# Patient Record
Sex: Female | Born: 1935 | Race: White | Hispanic: No | Smoking: Never smoker
Health system: Southern US, Community
[De-identification: ages and names within clinical notes are randomized; demographics above are authoritative.]

## PROBLEM LIST (undated history)

## (undated) DIAGNOSIS — E039 Hypothyroidism, unspecified: Secondary | ICD-10-CM

## (undated) DIAGNOSIS — E785 Hyperlipidemia, unspecified: Secondary | ICD-10-CM

## (undated) DIAGNOSIS — N289 Disorder of kidney and ureter, unspecified: Secondary | ICD-10-CM

## (undated) DIAGNOSIS — R42 Dizziness and giddiness: Secondary | ICD-10-CM

## (undated) DIAGNOSIS — M810 Age-related osteoporosis without current pathological fracture: Secondary | ICD-10-CM

## (undated) DIAGNOSIS — E871 Hypo-osmolality and hyponatremia: Secondary | ICD-10-CM

## (undated) DIAGNOSIS — I10 Essential (primary) hypertension: Secondary | ICD-10-CM

## (undated) DIAGNOSIS — N189 Chronic kidney disease, unspecified: Secondary | ICD-10-CM

## (undated) HISTORY — DX: Dizziness and giddiness: R42

## (undated) HISTORY — PX: BREAST BIOPSY: SHX20

## (undated) HISTORY — DX: Age-related osteoporosis without current pathological fracture: M81.0

## (undated) HISTORY — PX: COLONOSCOPY W/ POLYPECTOMY: SHX1380

## (undated) HISTORY — DX: Essential (primary) hypertension: I10

## (undated) HISTORY — DX: Hyperlipidemia, unspecified: E78.5

## (undated) HISTORY — DX: Chronic kidney disease, unspecified: N18.9

---

## 2000-06-29 ENCOUNTER — Ambulatory Visit (HOSPITAL_COMMUNITY): Admission: RE | Admit: 2000-06-29 | Discharge: 2000-06-29 | Payer: Self-pay | Admitting: Gastroenterology

## 2000-06-29 ENCOUNTER — Encounter: Payer: Self-pay | Admitting: Gastroenterology

## 2006-06-19 ENCOUNTER — Ambulatory Visit: Payer: Self-pay | Admitting: Unknown Physician Specialty

## 2010-07-13 ENCOUNTER — Inpatient Hospital Stay: Payer: Self-pay | Admitting: Internal Medicine

## 2010-10-05 ENCOUNTER — Ambulatory Visit: Payer: Self-pay

## 2010-10-06 ENCOUNTER — Inpatient Hospital Stay: Payer: Self-pay | Admitting: Surgery

## 2010-10-26 ENCOUNTER — Ambulatory Visit: Payer: Self-pay | Admitting: Surgery

## 2010-11-30 ENCOUNTER — Ambulatory Visit: Payer: Self-pay | Admitting: Urology

## 2011-06-19 DIAGNOSIS — E872 Acidosis, unspecified: Secondary | ICD-10-CM | POA: Diagnosis not present

## 2011-06-19 DIAGNOSIS — N184 Chronic kidney disease, stage 4 (severe): Secondary | ICD-10-CM | POA: Diagnosis not present

## 2011-06-19 DIAGNOSIS — I10 Essential (primary) hypertension: Secondary | ICD-10-CM | POA: Diagnosis not present

## 2011-06-19 DIAGNOSIS — N2581 Secondary hyperparathyroidism of renal origin: Secondary | ICD-10-CM | POA: Diagnosis not present

## 2011-06-20 DIAGNOSIS — L821 Other seborrheic keratosis: Secondary | ICD-10-CM | POA: Diagnosis not present

## 2011-06-20 DIAGNOSIS — D485 Neoplasm of uncertain behavior of skin: Secondary | ICD-10-CM | POA: Diagnosis not present

## 2011-06-20 DIAGNOSIS — Z85828 Personal history of other malignant neoplasm of skin: Secondary | ICD-10-CM | POA: Diagnosis not present

## 2011-06-20 DIAGNOSIS — L570 Actinic keratosis: Secondary | ICD-10-CM | POA: Diagnosis not present

## 2011-07-04 DIAGNOSIS — N170 Acute kidney failure with tubular necrosis: Secondary | ICD-10-CM | POA: Diagnosis not present

## 2011-08-24 DIAGNOSIS — J209 Acute bronchitis, unspecified: Secondary | ICD-10-CM | POA: Diagnosis not present

## 2011-09-04 DIAGNOSIS — Z0000 Encounter for general adult medical examination without abnormal findings: Secondary | ICD-10-CM | POA: Diagnosis not present

## 2011-09-04 DIAGNOSIS — E785 Hyperlipidemia, unspecified: Secondary | ICD-10-CM | POA: Diagnosis not present

## 2011-09-04 DIAGNOSIS — M899 Disorder of bone, unspecified: Secondary | ICD-10-CM | POA: Diagnosis not present

## 2011-09-04 DIAGNOSIS — M949 Disorder of cartilage, unspecified: Secondary | ICD-10-CM | POA: Diagnosis not present

## 2011-09-04 DIAGNOSIS — I10 Essential (primary) hypertension: Secondary | ICD-10-CM | POA: Diagnosis not present

## 2011-09-10 DIAGNOSIS — E872 Acidosis, unspecified: Secondary | ICD-10-CM | POA: Diagnosis not present

## 2011-09-10 DIAGNOSIS — I10 Essential (primary) hypertension: Secondary | ICD-10-CM | POA: Diagnosis not present

## 2011-09-10 DIAGNOSIS — N2581 Secondary hyperparathyroidism of renal origin: Secondary | ICD-10-CM | POA: Diagnosis not present

## 2011-09-10 DIAGNOSIS — N184 Chronic kidney disease, stage 4 (severe): Secondary | ICD-10-CM | POA: Diagnosis not present

## 2011-09-11 DIAGNOSIS — Z86010 Personal history of colonic polyps: Secondary | ICD-10-CM | POA: Diagnosis not present

## 2011-10-19 ENCOUNTER — Ambulatory Visit: Payer: Self-pay | Admitting: Unknown Physician Specialty

## 2011-10-19 DIAGNOSIS — Z860100 Personal history of colon polyps, unspecified: Secondary | ICD-10-CM | POA: Diagnosis not present

## 2011-10-19 DIAGNOSIS — Z86718 Personal history of other venous thrombosis and embolism: Secondary | ICD-10-CM | POA: Diagnosis not present

## 2011-10-19 DIAGNOSIS — E039 Hypothyroidism, unspecified: Secondary | ICD-10-CM | POA: Diagnosis not present

## 2011-10-19 DIAGNOSIS — Z09 Encounter for follow-up examination after completed treatment for conditions other than malignant neoplasm: Secondary | ICD-10-CM | POA: Diagnosis not present

## 2011-10-19 DIAGNOSIS — I10 Essential (primary) hypertension: Secondary | ICD-10-CM | POA: Diagnosis not present

## 2011-10-19 DIAGNOSIS — K5730 Diverticulosis of large intestine without perforation or abscess without bleeding: Secondary | ICD-10-CM | POA: Diagnosis not present

## 2011-10-19 DIAGNOSIS — E785 Hyperlipidemia, unspecified: Secondary | ICD-10-CM | POA: Diagnosis not present

## 2011-10-19 DIAGNOSIS — K648 Other hemorrhoids: Secondary | ICD-10-CM | POA: Diagnosis not present

## 2011-10-19 DIAGNOSIS — D126 Benign neoplasm of colon, unspecified: Secondary | ICD-10-CM | POA: Diagnosis not present

## 2011-10-19 DIAGNOSIS — Z9889 Other specified postprocedural states: Secondary | ICD-10-CM | POA: Diagnosis not present

## 2011-10-19 DIAGNOSIS — Z79899 Other long term (current) drug therapy: Secondary | ICD-10-CM | POA: Diagnosis not present

## 2011-10-22 LAB — PATHOLOGY REPORT

## 2011-10-30 IMAGING — US US RENAL KIDNEY
1 series · 14 of 25 positions shown · non-contrast
Comparison: none

REASON FOR EXAM: atrophic kidney
COMMENTS:

[Series 1: us renal kidney · 0.24mm/px · 14 of 33 slices shown]
[im 1/33]
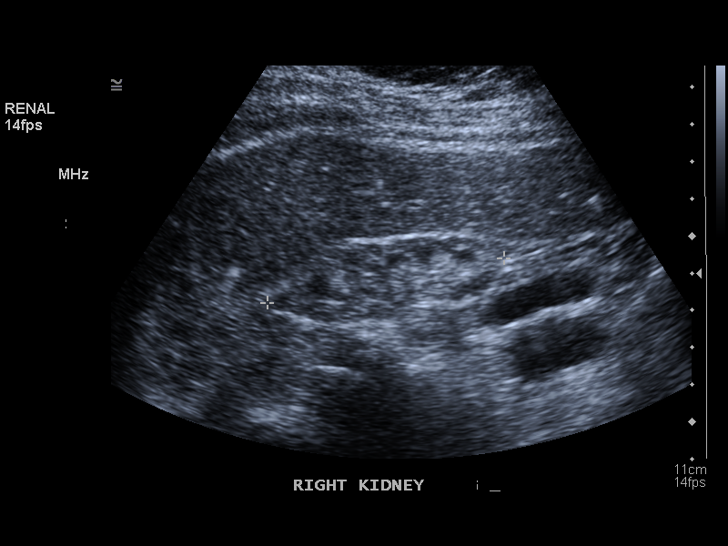
[im 3/33]
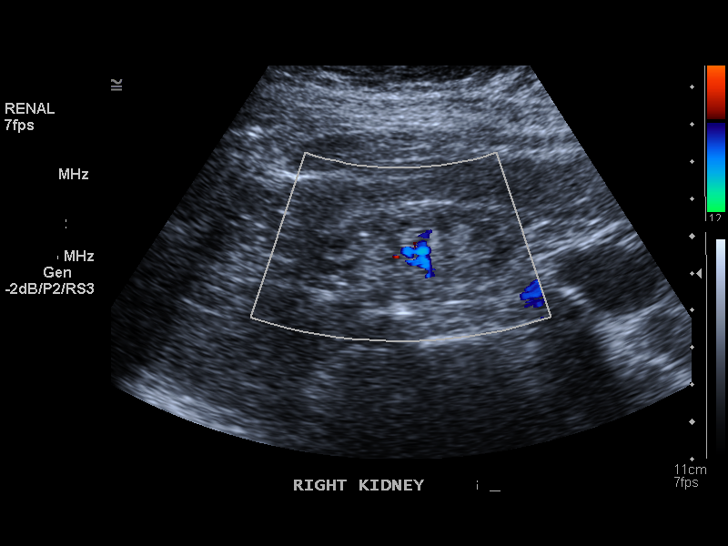
[im 6/33]
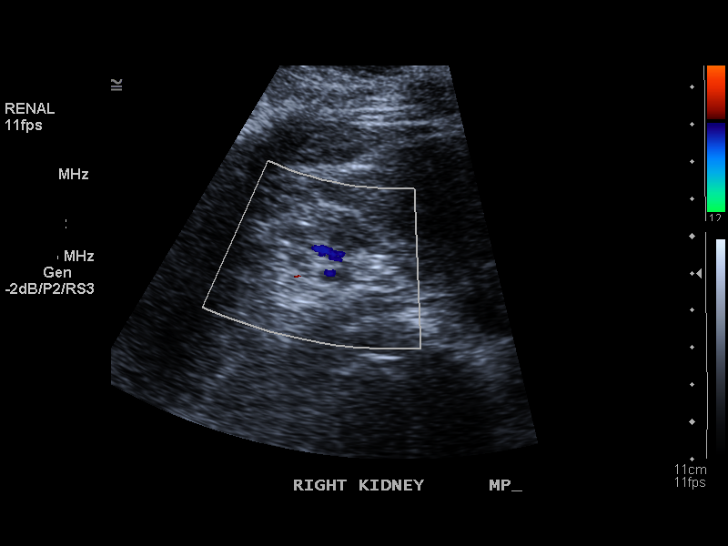
[im 9/33]
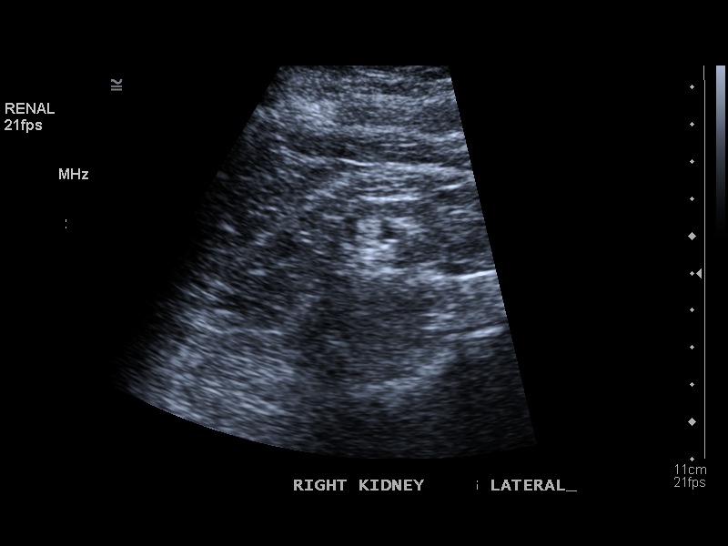
[im 11/33]
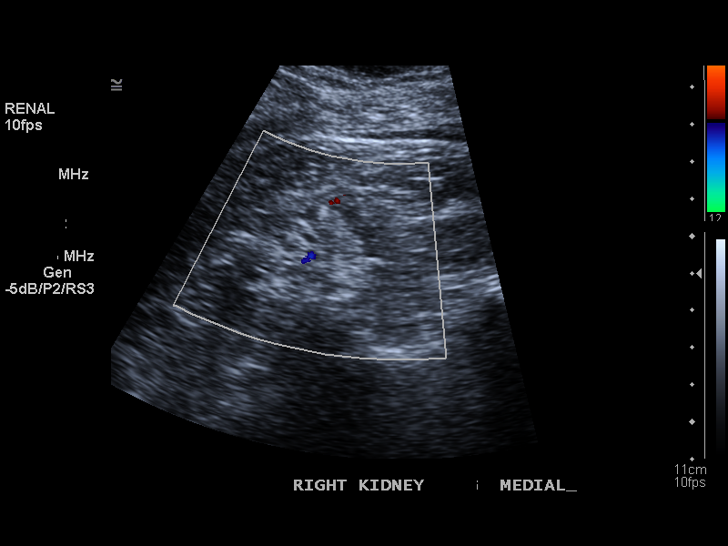
[im 13/33]
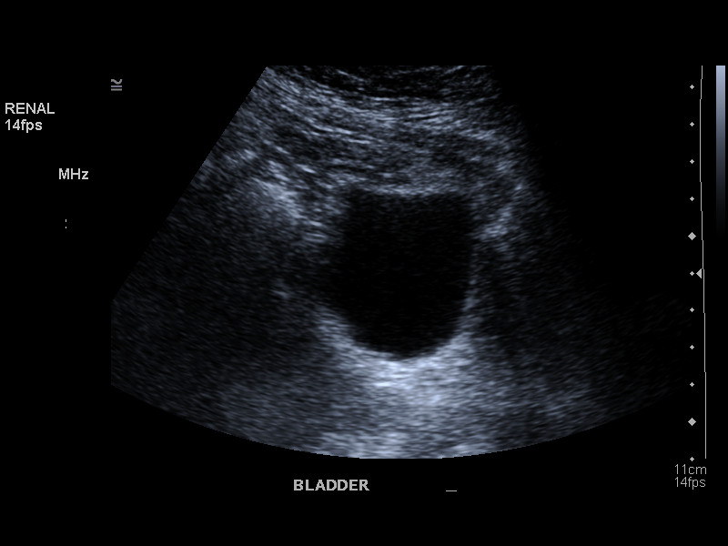
[im 15/33]
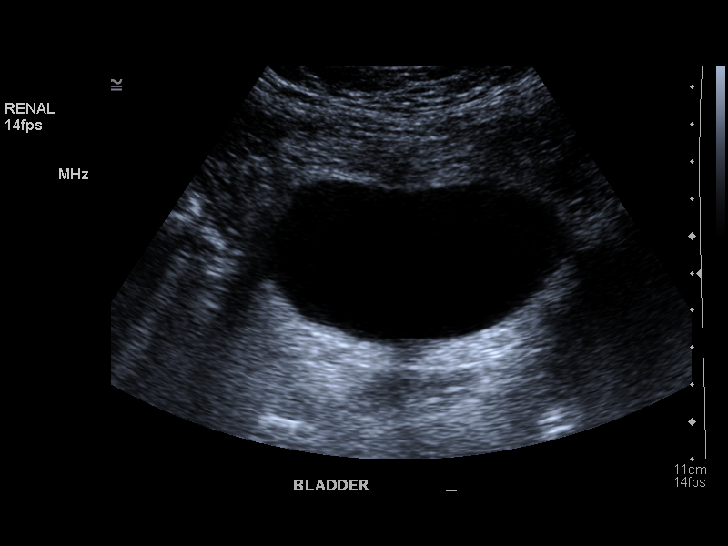
[im 18/33]
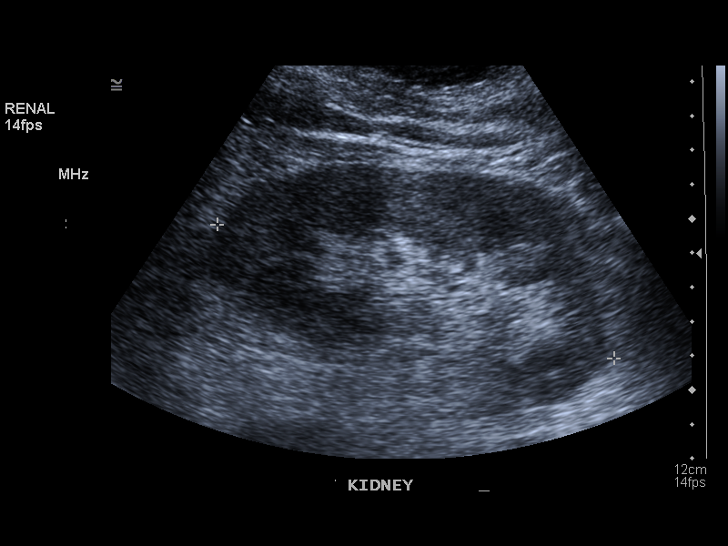
[im 21/33]
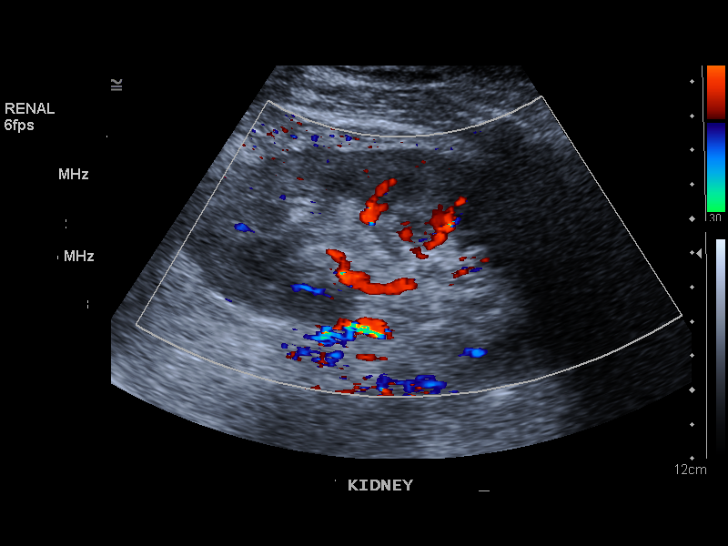
[im 22/33]
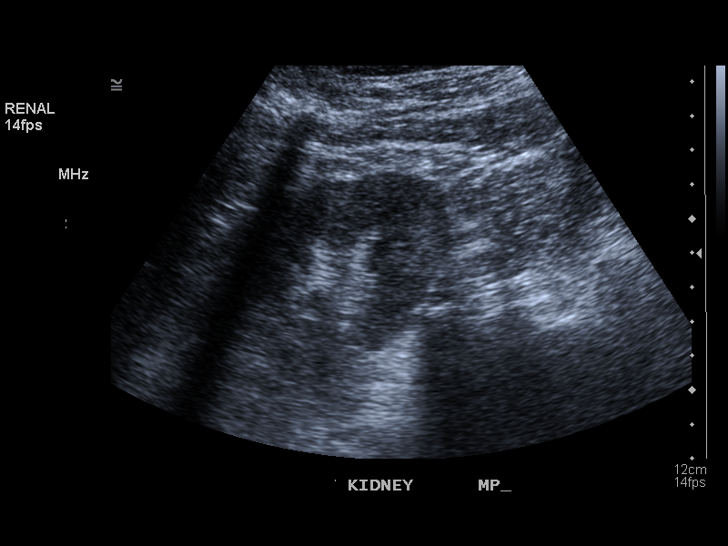
[im 25/33]
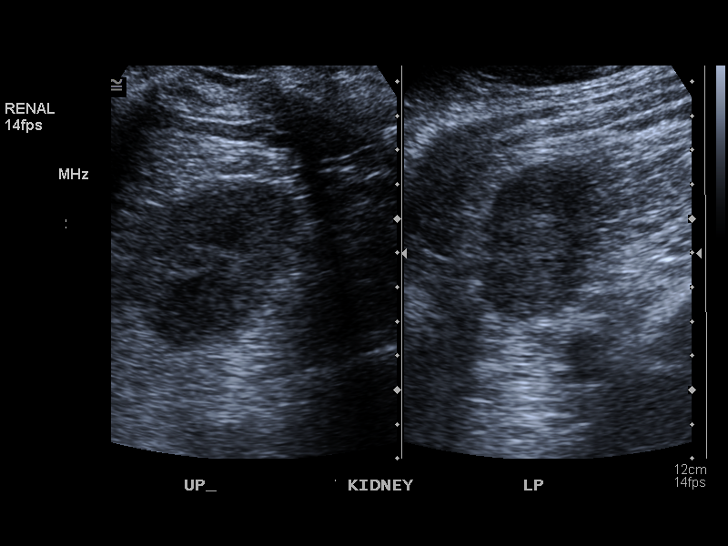
[im 27/33]
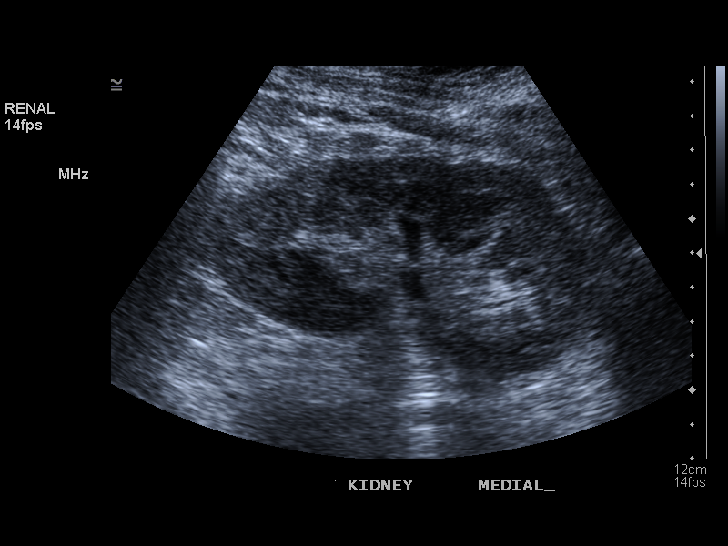
[im 30/33]
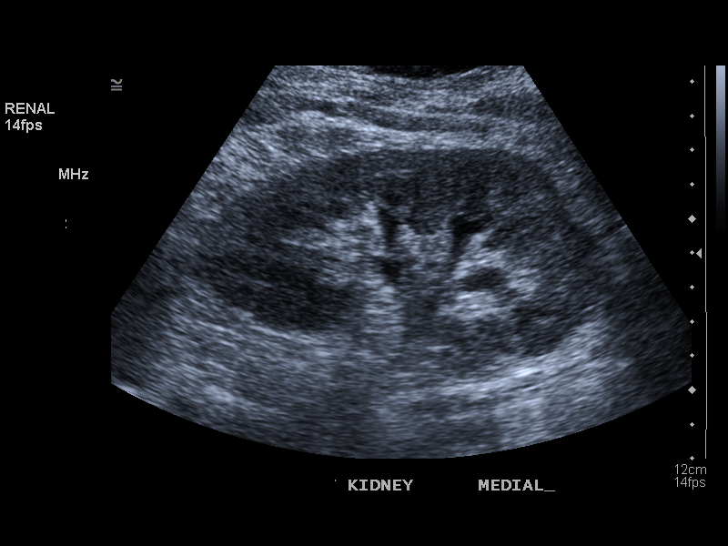
[im 33/33]
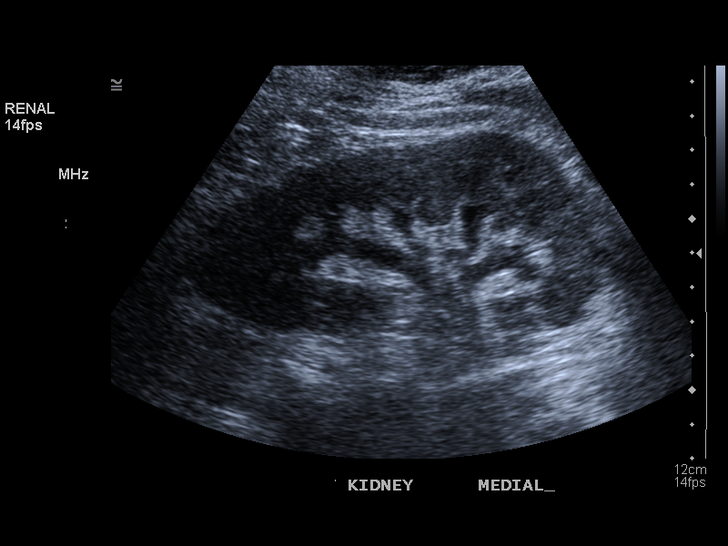

[14 of 25 positions shown; findings below may reference images not displayed]

PROCEDURE:     US  - US KIDNEY  - November 30, 2010 [DATE]

RESULT:     The right kidney measures 6.48 cm x 2.11 cm x 2.19 cm and the
left kidney measures 12.77 cm x 4.87 cm x 5.52 cm. No solid renal mass
lesions or renal calcifications are identified on either side. There is
slight prominence of the renal collecting system on the left compatible with
minimal hydronephrosis or minimal ectasia. The visualized portion of the
urinary bladder shows no specific abnormalities. Right and left ureteral
flow jets are seen.
IMPRESSION: 1. The right kidney is atrophic and hyperechogenic.
2. There is mild prominence of the renal collecting system of the right
kidney compatible with ectasia or less likely slight hydronephrosis.
3. Bilateral ureteral flow jets are seen.

## 2011-12-03 DIAGNOSIS — I10 Essential (primary) hypertension: Secondary | ICD-10-CM | POA: Diagnosis not present

## 2011-12-03 DIAGNOSIS — E872 Acidosis, unspecified: Secondary | ICD-10-CM | POA: Diagnosis not present

## 2011-12-03 DIAGNOSIS — N183 Chronic kidney disease, stage 3 unspecified: Secondary | ICD-10-CM | POA: Diagnosis not present

## 2011-12-03 DIAGNOSIS — N2581 Secondary hyperparathyroidism of renal origin: Secondary | ICD-10-CM | POA: Diagnosis not present

## 2012-02-25 DIAGNOSIS — E872 Acidosis, unspecified: Secondary | ICD-10-CM | POA: Diagnosis not present

## 2012-02-25 DIAGNOSIS — N183 Chronic kidney disease, stage 3 unspecified: Secondary | ICD-10-CM | POA: Diagnosis not present

## 2012-02-25 DIAGNOSIS — N2581 Secondary hyperparathyroidism of renal origin: Secondary | ICD-10-CM | POA: Diagnosis not present

## 2012-02-25 DIAGNOSIS — I10 Essential (primary) hypertension: Secondary | ICD-10-CM | POA: Diagnosis not present

## 2012-03-05 DIAGNOSIS — I10 Essential (primary) hypertension: Secondary | ICD-10-CM | POA: Diagnosis not present

## 2012-03-05 DIAGNOSIS — R7989 Other specified abnormal findings of blood chemistry: Secondary | ICD-10-CM | POA: Diagnosis not present

## 2012-03-05 DIAGNOSIS — E785 Hyperlipidemia, unspecified: Secondary | ICD-10-CM | POA: Diagnosis not present

## 2012-03-05 DIAGNOSIS — Z23 Encounter for immunization: Secondary | ICD-10-CM | POA: Diagnosis not present

## 2012-05-19 DIAGNOSIS — N184 Chronic kidney disease, stage 4 (severe): Secondary | ICD-10-CM | POA: Diagnosis not present

## 2012-05-19 DIAGNOSIS — I10 Essential (primary) hypertension: Secondary | ICD-10-CM | POA: Diagnosis not present

## 2012-05-19 DIAGNOSIS — N2581 Secondary hyperparathyroidism of renal origin: Secondary | ICD-10-CM | POA: Diagnosis not present

## 2012-05-19 DIAGNOSIS — E872 Acidosis, unspecified: Secondary | ICD-10-CM | POA: Diagnosis not present

## 2012-06-03 DIAGNOSIS — N390 Urinary tract infection, site not specified: Secondary | ICD-10-CM | POA: Diagnosis not present

## 2012-07-24 DIAGNOSIS — L821 Other seborrheic keratosis: Secondary | ICD-10-CM | POA: Diagnosis not present

## 2012-07-24 DIAGNOSIS — D2370 Other benign neoplasm of skin of unspecified lower limb, including hip: Secondary | ICD-10-CM | POA: Diagnosis not present

## 2012-08-20 DIAGNOSIS — N184 Chronic kidney disease, stage 4 (severe): Secondary | ICD-10-CM | POA: Diagnosis not present

## 2012-08-26 DIAGNOSIS — N184 Chronic kidney disease, stage 4 (severe): Secondary | ICD-10-CM | POA: Diagnosis not present

## 2012-08-26 DIAGNOSIS — N2581 Secondary hyperparathyroidism of renal origin: Secondary | ICD-10-CM | POA: Diagnosis not present

## 2012-08-26 DIAGNOSIS — I10 Essential (primary) hypertension: Secondary | ICD-10-CM | POA: Diagnosis not present

## 2012-08-26 DIAGNOSIS — E872 Acidosis, unspecified: Secondary | ICD-10-CM | POA: Diagnosis not present

## 2012-10-20 DIAGNOSIS — E039 Hypothyroidism, unspecified: Secondary | ICD-10-CM | POA: Diagnosis not present

## 2012-10-20 DIAGNOSIS — I10 Essential (primary) hypertension: Secondary | ICD-10-CM | POA: Diagnosis not present

## 2012-10-20 DIAGNOSIS — Z0000 Encounter for general adult medical examination without abnormal findings: Secondary | ICD-10-CM | POA: Diagnosis not present

## 2012-10-20 DIAGNOSIS — E7800 Pure hypercholesterolemia, unspecified: Secondary | ICD-10-CM | POA: Diagnosis not present

## 2012-10-20 DIAGNOSIS — N189 Chronic kidney disease, unspecified: Secondary | ICD-10-CM | POA: Diagnosis not present

## 2012-12-10 DIAGNOSIS — N184 Chronic kidney disease, stage 4 (severe): Secondary | ICD-10-CM | POA: Diagnosis not present

## 2012-12-17 DIAGNOSIS — N2581 Secondary hyperparathyroidism of renal origin: Secondary | ICD-10-CM | POA: Diagnosis not present

## 2012-12-17 DIAGNOSIS — E872 Acidosis, unspecified: Secondary | ICD-10-CM | POA: Diagnosis not present

## 2012-12-17 DIAGNOSIS — N184 Chronic kidney disease, stage 4 (severe): Secondary | ICD-10-CM | POA: Diagnosis not present

## 2012-12-17 DIAGNOSIS — I10 Essential (primary) hypertension: Secondary | ICD-10-CM | POA: Diagnosis not present

## 2013-01-06 DIAGNOSIS — L821 Other seborrheic keratosis: Secondary | ICD-10-CM | POA: Diagnosis not present

## 2013-01-06 DIAGNOSIS — Q809 Congenital ichthyosis, unspecified: Secondary | ICD-10-CM | POA: Diagnosis not present

## 2013-01-06 DIAGNOSIS — L570 Actinic keratosis: Secondary | ICD-10-CM | POA: Diagnosis not present

## 2013-01-06 DIAGNOSIS — L819 Disorder of pigmentation, unspecified: Secondary | ICD-10-CM | POA: Diagnosis not present

## 2013-03-19 DIAGNOSIS — Z23 Encounter for immunization: Secondary | ICD-10-CM | POA: Diagnosis not present

## 2013-04-15 DIAGNOSIS — I10 Essential (primary) hypertension: Secondary | ICD-10-CM | POA: Diagnosis not present

## 2013-04-15 DIAGNOSIS — N2581 Secondary hyperparathyroidism of renal origin: Secondary | ICD-10-CM | POA: Diagnosis not present

## 2013-04-15 DIAGNOSIS — E872 Acidosis, unspecified: Secondary | ICD-10-CM | POA: Diagnosis not present

## 2013-04-15 DIAGNOSIS — N184 Chronic kidney disease, stage 4 (severe): Secondary | ICD-10-CM | POA: Diagnosis not present

## 2013-04-22 DIAGNOSIS — N2581 Secondary hyperparathyroidism of renal origin: Secondary | ICD-10-CM | POA: Diagnosis not present

## 2013-04-22 DIAGNOSIS — E872 Acidosis, unspecified: Secondary | ICD-10-CM | POA: Diagnosis not present

## 2013-04-22 DIAGNOSIS — N184 Chronic kidney disease, stage 4 (severe): Secondary | ICD-10-CM | POA: Diagnosis not present

## 2013-04-22 DIAGNOSIS — I10 Essential (primary) hypertension: Secondary | ICD-10-CM | POA: Diagnosis not present

## 2013-08-05 DIAGNOSIS — E872 Acidosis, unspecified: Secondary | ICD-10-CM | POA: Diagnosis not present

## 2013-08-05 DIAGNOSIS — N184 Chronic kidney disease, stage 4 (severe): Secondary | ICD-10-CM | POA: Diagnosis not present

## 2013-08-11 DIAGNOSIS — N2581 Secondary hyperparathyroidism of renal origin: Secondary | ICD-10-CM | POA: Diagnosis not present

## 2013-08-11 DIAGNOSIS — I10 Essential (primary) hypertension: Secondary | ICD-10-CM | POA: Diagnosis not present

## 2013-08-11 DIAGNOSIS — E872 Acidosis, unspecified: Secondary | ICD-10-CM | POA: Diagnosis not present

## 2013-08-11 DIAGNOSIS — N183 Chronic kidney disease, stage 3 unspecified: Secondary | ICD-10-CM | POA: Diagnosis not present

## 2013-08-27 DIAGNOSIS — D485 Neoplasm of uncertain behavior of skin: Secondary | ICD-10-CM | POA: Diagnosis not present

## 2013-08-27 DIAGNOSIS — L821 Other seborrheic keratosis: Secondary | ICD-10-CM | POA: Diagnosis not present

## 2013-08-27 DIAGNOSIS — B079 Viral wart, unspecified: Secondary | ICD-10-CM | POA: Diagnosis not present

## 2013-08-27 DIAGNOSIS — Z85828 Personal history of other malignant neoplasm of skin: Secondary | ICD-10-CM | POA: Diagnosis not present

## 2013-12-09 DIAGNOSIS — I10 Essential (primary) hypertension: Secondary | ICD-10-CM | POA: Diagnosis not present

## 2013-12-09 DIAGNOSIS — N2581 Secondary hyperparathyroidism of renal origin: Secondary | ICD-10-CM | POA: Diagnosis not present

## 2013-12-09 DIAGNOSIS — E872 Acidosis, unspecified: Secondary | ICD-10-CM | POA: Diagnosis not present

## 2013-12-09 DIAGNOSIS — N184 Chronic kidney disease, stage 4 (severe): Secondary | ICD-10-CM | POA: Diagnosis not present

## 2013-12-16 DIAGNOSIS — I10 Essential (primary) hypertension: Secondary | ICD-10-CM | POA: Diagnosis not present

## 2013-12-16 DIAGNOSIS — N183 Chronic kidney disease, stage 3 unspecified: Secondary | ICD-10-CM | POA: Diagnosis not present

## 2013-12-16 DIAGNOSIS — N2581 Secondary hyperparathyroidism of renal origin: Secondary | ICD-10-CM | POA: Diagnosis not present

## 2013-12-16 DIAGNOSIS — E872 Acidosis, unspecified: Secondary | ICD-10-CM | POA: Diagnosis not present

## 2013-12-30 DIAGNOSIS — Z23 Encounter for immunization: Secondary | ICD-10-CM | POA: Diagnosis not present

## 2013-12-30 DIAGNOSIS — Z0000 Encounter for general adult medical examination without abnormal findings: Secondary | ICD-10-CM | POA: Diagnosis not present

## 2013-12-30 DIAGNOSIS — I10 Essential (primary) hypertension: Secondary | ICD-10-CM | POA: Diagnosis not present

## 2013-12-30 DIAGNOSIS — E039 Hypothyroidism, unspecified: Secondary | ICD-10-CM | POA: Diagnosis not present

## 2014-01-18 DIAGNOSIS — M949 Disorder of cartilage, unspecified: Secondary | ICD-10-CM | POA: Diagnosis not present

## 2014-01-18 DIAGNOSIS — M899 Disorder of bone, unspecified: Secondary | ICD-10-CM | POA: Diagnosis not present

## 2014-01-18 DIAGNOSIS — R937 Abnormal findings on diagnostic imaging of other parts of musculoskeletal system: Secondary | ICD-10-CM | POA: Diagnosis not present

## 2014-01-18 DIAGNOSIS — Z13820 Encounter for screening for osteoporosis: Secondary | ICD-10-CM | POA: Diagnosis not present

## 2014-01-18 DIAGNOSIS — Z780 Asymptomatic menopausal state: Secondary | ICD-10-CM | POA: Diagnosis not present

## 2014-01-18 DIAGNOSIS — Z1231 Encounter for screening mammogram for malignant neoplasm of breast: Secondary | ICD-10-CM | POA: Diagnosis not present

## 2014-01-18 DIAGNOSIS — E2839 Other primary ovarian failure: Secondary | ICD-10-CM | POA: Diagnosis not present

## 2014-02-22 DIAGNOSIS — Z23 Encounter for immunization: Secondary | ICD-10-CM | POA: Diagnosis not present

## 2014-03-31 DIAGNOSIS — N2581 Secondary hyperparathyroidism of renal origin: Secondary | ICD-10-CM | POA: Diagnosis not present

## 2014-03-31 DIAGNOSIS — E872 Acidosis: Secondary | ICD-10-CM | POA: Diagnosis not present

## 2014-03-31 DIAGNOSIS — N184 Chronic kidney disease, stage 4 (severe): Secondary | ICD-10-CM | POA: Diagnosis not present

## 2014-03-31 DIAGNOSIS — I10 Essential (primary) hypertension: Secondary | ICD-10-CM | POA: Diagnosis not present

## 2014-04-08 DIAGNOSIS — E872 Acidosis: Secondary | ICD-10-CM | POA: Diagnosis not present

## 2014-04-08 DIAGNOSIS — N2581 Secondary hyperparathyroidism of renal origin: Secondary | ICD-10-CM | POA: Diagnosis not present

## 2014-04-08 DIAGNOSIS — I10 Essential (primary) hypertension: Secondary | ICD-10-CM | POA: Diagnosis not present

## 2014-04-08 DIAGNOSIS — N184 Chronic kidney disease, stage 4 (severe): Secondary | ICD-10-CM | POA: Diagnosis not present

## 2014-05-19 DIAGNOSIS — M810 Age-related osteoporosis without current pathological fracture: Secondary | ICD-10-CM | POA: Diagnosis not present

## 2014-05-19 DIAGNOSIS — N184 Chronic kidney disease, stage 4 (severe): Secondary | ICD-10-CM | POA: Diagnosis not present

## 2014-05-19 DIAGNOSIS — I10 Essential (primary) hypertension: Secondary | ICD-10-CM | POA: Diagnosis not present

## 2014-05-19 DIAGNOSIS — R42 Dizziness and giddiness: Secondary | ICD-10-CM | POA: Diagnosis not present

## 2014-05-19 DIAGNOSIS — E780 Pure hypercholesterolemia: Secondary | ICD-10-CM | POA: Diagnosis not present

## 2014-05-19 DIAGNOSIS — E039 Hypothyroidism, unspecified: Secondary | ICD-10-CM | POA: Diagnosis not present

## 2014-05-19 DIAGNOSIS — E211 Secondary hyperparathyroidism, not elsewhere classified: Secondary | ICD-10-CM | POA: Diagnosis not present

## 2014-06-22 DIAGNOSIS — N2581 Secondary hyperparathyroidism of renal origin: Secondary | ICD-10-CM | POA: Diagnosis not present

## 2014-06-22 DIAGNOSIS — I10 Essential (primary) hypertension: Secondary | ICD-10-CM | POA: Diagnosis not present

## 2014-06-22 DIAGNOSIS — N184 Chronic kidney disease, stage 4 (severe): Secondary | ICD-10-CM | POA: Diagnosis not present

## 2014-06-22 DIAGNOSIS — E872 Acidosis: Secondary | ICD-10-CM | POA: Diagnosis not present

## 2014-06-24 DIAGNOSIS — E872 Acidosis: Secondary | ICD-10-CM | POA: Diagnosis not present

## 2014-06-24 DIAGNOSIS — N2581 Secondary hyperparathyroidism of renal origin: Secondary | ICD-10-CM | POA: Diagnosis not present

## 2014-06-24 DIAGNOSIS — I10 Essential (primary) hypertension: Secondary | ICD-10-CM | POA: Diagnosis not present

## 2014-06-24 DIAGNOSIS — N184 Chronic kidney disease, stage 4 (severe): Secondary | ICD-10-CM | POA: Diagnosis not present

## 2014-07-06 DIAGNOSIS — N390 Urinary tract infection, site not specified: Secondary | ICD-10-CM | POA: Diagnosis not present

## 2014-07-06 DIAGNOSIS — N184 Chronic kidney disease, stage 4 (severe): Secondary | ICD-10-CM | POA: Diagnosis not present

## 2014-08-26 DIAGNOSIS — D225 Melanocytic nevi of trunk: Secondary | ICD-10-CM | POA: Diagnosis not present

## 2014-08-26 DIAGNOSIS — L538 Other specified erythematous conditions: Secondary | ICD-10-CM | POA: Diagnosis not present

## 2014-08-26 DIAGNOSIS — R208 Other disturbances of skin sensation: Secondary | ICD-10-CM | POA: Diagnosis not present

## 2014-08-26 DIAGNOSIS — L820 Inflamed seborrheic keratosis: Secondary | ICD-10-CM | POA: Diagnosis not present

## 2014-08-26 DIAGNOSIS — Z85828 Personal history of other malignant neoplasm of skin: Secondary | ICD-10-CM | POA: Diagnosis not present

## 2014-08-26 DIAGNOSIS — L570 Actinic keratosis: Secondary | ICD-10-CM | POA: Diagnosis not present

## 2014-08-26 DIAGNOSIS — X32XXXA Exposure to sunlight, initial encounter: Secondary | ICD-10-CM | POA: Diagnosis not present

## 2014-08-26 DIAGNOSIS — D2271 Melanocytic nevi of right lower limb, including hip: Secondary | ICD-10-CM | POA: Diagnosis not present

## 2014-08-26 DIAGNOSIS — D2261 Melanocytic nevi of right upper limb, including shoulder: Secondary | ICD-10-CM | POA: Diagnosis not present

## 2014-10-12 DIAGNOSIS — I10 Essential (primary) hypertension: Secondary | ICD-10-CM | POA: Diagnosis not present

## 2014-10-12 DIAGNOSIS — N184 Chronic kidney disease, stage 4 (severe): Secondary | ICD-10-CM | POA: Diagnosis not present

## 2014-10-12 DIAGNOSIS — N2581 Secondary hyperparathyroidism of renal origin: Secondary | ICD-10-CM | POA: Diagnosis not present

## 2014-10-12 DIAGNOSIS — E872 Acidosis: Secondary | ICD-10-CM | POA: Diagnosis not present

## 2014-10-14 DIAGNOSIS — N2581 Secondary hyperparathyroidism of renal origin: Secondary | ICD-10-CM | POA: Diagnosis not present

## 2014-10-14 DIAGNOSIS — I10 Essential (primary) hypertension: Secondary | ICD-10-CM | POA: Diagnosis not present

## 2014-10-14 DIAGNOSIS — E872 Acidosis: Secondary | ICD-10-CM | POA: Diagnosis not present

## 2014-10-14 DIAGNOSIS — N183 Chronic kidney disease, stage 3 (moderate): Secondary | ICD-10-CM | POA: Diagnosis not present

## 2015-01-21 ENCOUNTER — Telehealth: Payer: Self-pay

## 2015-01-21 DIAGNOSIS — E039 Hypothyroidism, unspecified: Secondary | ICD-10-CM

## 2015-01-21 DIAGNOSIS — I10 Essential (primary) hypertension: Secondary | ICD-10-CM | POA: Insufficient documentation

## 2015-01-21 DIAGNOSIS — E785 Hyperlipidemia, unspecified: Secondary | ICD-10-CM

## 2015-01-21 NOTE — Telephone Encounter (Signed)
Requesting Levothyroxine 43mcg Patient has CPE Mar 16, 2015

## 2015-01-24 MED ORDER — LEVOTHYROXINE SODIUM 50 MCG PO TABS: 50.0000 ug | ORAL_TABLET | Freq: Every day | ORAL | Status: DC

## 2015-02-08 DIAGNOSIS — N184 Chronic kidney disease, stage 4 (severe): Secondary | ICD-10-CM | POA: Diagnosis not present

## 2015-02-08 DIAGNOSIS — I10 Essential (primary) hypertension: Secondary | ICD-10-CM | POA: Diagnosis not present

## 2015-02-08 DIAGNOSIS — E872 Acidosis: Secondary | ICD-10-CM | POA: Diagnosis not present

## 2015-02-08 DIAGNOSIS — N2581 Secondary hyperparathyroidism of renal origin: Secondary | ICD-10-CM | POA: Diagnosis not present

## 2015-02-11 DIAGNOSIS — I10 Essential (primary) hypertension: Secondary | ICD-10-CM | POA: Diagnosis not present

## 2015-02-11 DIAGNOSIS — N183 Chronic kidney disease, stage 3 (moderate): Secondary | ICD-10-CM | POA: Diagnosis not present

## 2015-02-11 DIAGNOSIS — N2581 Secondary hyperparathyroidism of renal origin: Secondary | ICD-10-CM | POA: Diagnosis not present

## 2015-02-11 DIAGNOSIS — E872 Acidosis: Secondary | ICD-10-CM | POA: Diagnosis not present

## 2015-02-22 ENCOUNTER — Other Ambulatory Visit: Payer: Self-pay | Admitting: Family Medicine

## 2015-02-22 NOTE — Telephone Encounter (Signed)
Pt needs an apt for meds

## 2015-03-16 ENCOUNTER — Encounter: Payer: Self-pay | Admitting: Family Medicine

## 2015-03-16 ENCOUNTER — Ambulatory Visit (INDEPENDENT_AMBULATORY_CARE_PROVIDER_SITE_OTHER): Payer: Medicare Other | Admitting: Family Medicine

## 2015-03-16 VITALS — BP 138/82 | HR 75 | Temp 98.5°F | Ht 67.5 in | Wt 197.0 lb

## 2015-03-16 DIAGNOSIS — E785 Hyperlipidemia, unspecified: Secondary | ICD-10-CM | POA: Diagnosis not present

## 2015-03-16 DIAGNOSIS — E039 Hypothyroidism, unspecified: Secondary | ICD-10-CM | POA: Diagnosis not present

## 2015-03-16 DIAGNOSIS — Z23 Encounter for immunization: Secondary | ICD-10-CM | POA: Diagnosis not present

## 2015-03-16 DIAGNOSIS — N261 Atrophy of kidney (terminal): Secondary | ICD-10-CM

## 2015-03-16 DIAGNOSIS — M7551 Bursitis of right shoulder: Secondary | ICD-10-CM

## 2015-03-16 DIAGNOSIS — M7550 Bursitis of unspecified shoulder: Secondary | ICD-10-CM | POA: Insufficient documentation

## 2015-03-16 DIAGNOSIS — I10 Essential (primary) hypertension: Secondary | ICD-10-CM | POA: Diagnosis not present

## 2015-03-16 DIAGNOSIS — Z0000 Encounter for general adult medical examination without abnormal findings: Secondary | ICD-10-CM | POA: Diagnosis not present

## 2015-03-16 DIAGNOSIS — N183 Chronic kidney disease, stage 3 unspecified: Secondary | ICD-10-CM

## 2015-03-16 DIAGNOSIS — N184 Chronic kidney disease, stage 4 (severe): Secondary | ICD-10-CM | POA: Insufficient documentation

## 2015-03-16 LAB — MICROSCOPIC EXAMINATION
Epithelial Cells (non renal): >10 /hpf — AB (ref 0–10)
Renal Epithel, UA: NONE SEEN /hpf

## 2015-03-16 LAB — URINALYSIS, ROUTINE W REFLEX MICROSCOPIC
Bilirubin, UA: NEGATIVE
GLUCOSE, UA: NEGATIVE
Ketones, UA: NEGATIVE
Nitrite, UA: NEGATIVE
PROTEIN UA: NEGATIVE
RBC, UA: NEGATIVE
Specific Gravity, UA: 1.015 (ref 1.005–1.030)
Urobilinogen, Ur: 0.2 mg/dL (ref 0.2–1.0)
pH, UA: 5.5 (ref 5.0–7.5)

## 2015-03-16 MED ORDER — HYDRALAZINE HCL 25 MG PO TABS: 50.0000 mg | ORAL_TABLET | Freq: Every day | ORAL | Status: DC

## 2015-03-16 MED ORDER — ATORVASTATIN CALCIUM 40 MG PO TABS: 40.0000 mg | ORAL_TABLET | Freq: Every day | ORAL | Status: DC

## 2015-03-16 MED ORDER — LEVOTHYROXINE SODIUM 50 MCG PO TABS: 50.0000 ug | ORAL_TABLET | Freq: Every day | ORAL | Status: DC

## 2015-03-16 MED ORDER — AMLODIPINE BESYLATE 10 MG PO TABS: 10.0000 mg | ORAL_TABLET | Freq: Every day | ORAL | Status: DC

## 2015-03-16 MED ORDER — LISINOPRIL 10 MG PO TABS: 10.0000 mg | ORAL_TABLET | Freq: Every day | ORAL | Status: DC

## 2015-03-16 NOTE — Assessment & Plan Note (Signed)
The current medical regimen is effective;  continue present plan and medications.  

## 2015-03-16 NOTE — Addendum Note (Signed)
Addended byGolden Pop on: 03/16/2015 10:11 AM   Modules accepted: Miquel Dunn

## 2015-03-16 NOTE — Assessment & Plan Note (Signed)
Discussed shoulder bursitis possibility of torn rotator cuff discussed medicine, physical therapy, orthopedic referral, patient for now will just observe use Tylenol

## 2015-03-16 NOTE — Progress Notes (Signed)
BP 138/82 mmHg  Pulse 75  Temp(Src) 98.5 F (36.9 C)  Ht 5' 7.5" (1.715 m)  Wt 197 lb (89.359 kg)  BMI 30.38 kg/m2  SpO2 99%   Subjective:    Patient ID: Karla Little, female    DOB: 09-12-1933, 81 y.o.   MRN: [id number]  HPI: Karla Little is a 81 y.o. female  Chief Complaint  Patient presents with  . Annual Exam  AWV metrics done  For the last month or so patient with right shoulder discomfort pain wakes her from her sleep during the day doesn't bother her no known trauma irritation. Has done Tylenol at bedtime and maybe helps some. Blood pressure, cholesterol and thyroid doing well with no issues with medications side effects takes medications faithfully. Has nitroglycerin for chest pain hasn't needed to use that recently. It is doing well has apt for emergency Relevant past medical, surgical, family and social history reviewed and updated as indicated. Interim medical history since our last visit reviewed. Allergies and medications reviewed and updated.  Review of Systems  Constitutional: Negative.   HENT: Negative.   Eyes: Negative.   Respiratory: Negative.   Cardiovascular: Negative.   Gastrointestinal: Negative.   Endocrine: Negative.   Genitourinary: Negative.   Musculoskeletal: Negative.   Skin: Negative.   Allergic/Immunologic: Negative.   Neurological: Negative.   Hematological: Negative.   Psychiatric/Behavioral: Negative.     Per HPI unless specifically indicated above     Objective:    BP 138/82 mmHg  Pulse 75  Temp(Src) 98.5 F (36.9 C)  Ht 5' 7.5" (1.715 m)  Wt 197 lb (89.359 kg)  BMI 30.38 kg/m2  SpO2 99%  Wt Readings from Last 3 Encounters:  03/16/15 197 lb (89.359 kg)  05/19/14 186 lb (84.369 kg)    Physical Exam  Constitutional: She is oriented to person, place, and time. She appears well-developed and well-nourished.  HENT:  Head: Normocephalic and atraumatic.  Right Ear: External ear normal.  Left Ear: External ear  normal.  Nose: Nose normal.  Mouth/Throat: Oropharynx is clear and moist.  Eyes: Conjunctivae and EOM are normal. Pupils are equal, round, and reactive to light.  Neck: Normal range of motion. Neck supple. Carotid bruit is not present.  Cardiovascular: Normal rate, regular rhythm and normal heart sounds.   No murmur heard. Pulmonary/Chest: Effort normal and breath sounds normal. She exhibits no mass. Right breast exhibits no mass, no skin change and no tenderness. Left breast exhibits no mass, no skin change and no tenderness. Breasts are symmetrical.  Abdominal: Soft. Bowel sounds are normal. There is no hepatosplenomegaly.  Musculoskeletal: Normal range of motion.  Neurological: She is alert and oriented to person, place, and time.  Skin: No rash noted.  Psychiatric: She has a normal mood and affect. Her behavior is normal. Judgment and thought content normal.        Assessment & Plan:   Problem List Items Addressed This Visit      Cardiovascular and Mediastinum   Hypertension    The current medical regimen is effective;  continue present plan and medications.       Relevant Medications   amLODipine (NORVASC) 10 MG tablet   atorvastatin (LIPITOR) 40 MG tablet   hydrALAZINE (APRESOLINE) 25 MG tablet   lisinopril (PRINIVIL,ZESTRIL) 10 MG tablet   Other Relevant Orders   Comprehensive metabolic panel   CBC with Differential/Platelet   Urinalysis, Routine w reflex microscopic (not at [facility])     Endocrine  Hypothyroidism    The current medical regimen is effective;  continue present plan and medications.       Relevant Medications   levothyroxine (SYNTHROID, LEVOTHROID) 50 MCG tablet   Other Relevant Orders   Comprehensive metabolic panel   CBC with Differential/Platelet   Urinalysis, Routine w reflex microscopic (not at [facility])   TSH     Genitourinary   Atrophy of right kidney   Chronic kidney disease, stage 3   Relevant Orders   Comprehensive metabolic panel   CBC  with Differential/Platelet   Urinalysis, Routine w reflex microscopic (not at [facility])     Other   Hyperlipidemia    The current medical regimen is effective;  continue present plan and medications.       Relevant Medications   amLODipine (NORVASC) 10 MG tablet   atorvastatin (LIPITOR) 40 MG tablet   hydrALAZINE (APRESOLINE) 25 MG tablet   lisinopril (PRINIVIL,ZESTRIL) 10 MG tablet   Other Relevant Orders   Lipid panel   Comprehensive metabolic panel   CBC with Differential/Platelet   Urinalysis, Routine w reflex microscopic (not at [facility])    Other Visit Diagnoses    Immunization due    -  Primary    Relevant Orders    Flu Vaccine QUAD 36+ mos PF IM (Fluarix & Fluzone Quad PF) (Completed)    PE (physical exam), annual            Follow up plan: Return in about 6 months (around 09/14/2015), or if symptoms worsen or fail to improve, for BMP, lipid panel, ALT, AST.

## 2015-03-17 ENCOUNTER — Encounter: Payer: Self-pay | Admitting: Family Medicine

## 2015-03-17 LAB — LIPID PANEL
CHOL/HDL RATIO: 2.4 ratio (ref 0.0–4.4)
Cholesterol, Total: 188 mg/dL (ref 100–199)
HDL: 77 mg/dL (ref >39)
LDL CALC: 85 mg/dL (ref 0–99)
TRIGLYCERIDES: 131 mg/dL (ref 0–149)
VLDL Cholesterol Cal: 26 mg/dL (ref 5–40)

## 2015-03-17 LAB — CBC WITH DIFFERENTIAL/PLATELET
BASOS: 1 %
Basophils Absolute: 0 10*3/uL (ref 0.0–0.2)
EOS (ABSOLUTE): 0.2 10*3/uL (ref 0.0–0.4)
EOS: 4 %
HEMATOCRIT: 40.2 % (ref 34.0–46.6)
HEMOGLOBIN: 13.6 g/dL (ref 11.1–15.9)
IMMATURE GRANULOCYTES: 0 %
Immature Grans (Abs): 0 10*3/uL (ref 0.0–0.1)
LYMPHS ABS: 1.3 10*3/uL (ref 0.7–3.1)
Lymphs: 29 %
MCH: 31.5 pg (ref 26.6–33.0)
MCHC: 33.8 g/dL (ref 31.5–35.7)
MCV: 93 fL (ref 79–97)
MONOCYTES: 11 %
Monocytes Absolute: 0.5 10*3/uL (ref 0.1–0.9)
NEUTROS PCT: 55 %
Neutrophils Absolute: 2.5 10*3/uL (ref 1.4–7.0)
Platelets: 244 10*3/uL (ref 150–379)
RBC: 4.32 x10E6/uL (ref 3.77–5.28)
RDW: 13.4 % (ref 12.3–15.4)
WBC: 4.5 10*3/uL (ref 3.4–10.8)

## 2015-03-17 LAB — TSH: TSH: 2.15 u[IU]/mL (ref 0.450–4.500)

## 2015-03-17 LAB — COMPREHENSIVE METABOLIC PANEL
A/G RATIO: 1.8 (ref 1.1–2.5)
ALT: 14 IU/L (ref 0–32)
AST: 16 IU/L (ref 0–40)
Albumin: 4.4 g/dL (ref 3.5–4.8)
Alkaline Phosphatase: 93 IU/L (ref 39–117)
BILIRUBIN TOTAL: 0.5 mg/dL (ref 0.0–1.2)
BUN / CREAT RATIO: 14 (ref 11–26)
BUN: 24 mg/dL (ref 8–27)
CO2: 21 mmol/L (ref 18–29)
CREATININE: 1.71 mg/dL — AB (ref 0.57–1.00)
Calcium: 10 mg/dL (ref 8.7–10.3)
Chloride: 102 mmol/L (ref 97–108)
GFR calc Af Amer: 32 mL/min/{1.73_m2} — ABNORMAL LOW (ref >59)
GFR, EST NON AFRICAN AMERICAN: 28 mL/min/{1.73_m2} — AB (ref >59)
Globulin, Total: 2.4 g/dL (ref 1.5–4.5)
Glucose: 98 mg/dL (ref 65–99)
Potassium: 5.1 mmol/L (ref 3.5–5.2)
Sodium: 139 mmol/L (ref 134–144)
Total Protein: 6.8 g/dL (ref 6.0–8.5)

## 2015-03-21 ENCOUNTER — Emergency Department
Admission: EM | Admit: 2015-03-21 | Discharge: 2015-03-21 | Disposition: A | Discharge status: Home / Self Care | Payer: Medicare Other | Attending: Emergency Medicine | Admitting: Emergency Medicine

## 2015-03-21 ENCOUNTER — Encounter: Payer: Self-pay | Admitting: Emergency Medicine

## 2015-03-21 DIAGNOSIS — Z79899 Other long term (current) drug therapy: Secondary | ICD-10-CM | POA: Diagnosis not present

## 2015-03-21 DIAGNOSIS — N184 Chronic kidney disease, stage 4 (severe): Secondary | ICD-10-CM | POA: Insufficient documentation

## 2015-03-21 DIAGNOSIS — R1030 Lower abdominal pain, unspecified: Secondary | ICD-10-CM | POA: Diagnosis present

## 2015-03-21 DIAGNOSIS — I129 Hypertensive chronic kidney disease with stage 1 through stage 4 chronic kidney disease, or unspecified chronic kidney disease: Secondary | ICD-10-CM | POA: Insufficient documentation

## 2015-03-21 DIAGNOSIS — N3090 Cystitis, unspecified without hematuria: Secondary | ICD-10-CM | POA: Diagnosis not present

## 2015-03-21 LAB — URINALYSIS COMPLETE WITH MICROSCOPIC (ARMC ONLY)
BILIRUBIN URINE: NEGATIVE
Glucose, UA: NEGATIVE mg/dL
KETONES UR: NEGATIVE mg/dL
Nitrite: NEGATIVE
PH: 6 (ref 5.0–8.0)
Protein, ur: NEGATIVE mg/dL
SPECIFIC GRAVITY, URINE: 1.004 — AB (ref 1.005–1.030)

## 2015-03-21 LAB — COMPREHENSIVE METABOLIC PANEL
ALBUMIN: 4.3 g/dL (ref 3.5–5.0)
ALT: 19 U/L (ref 14–54)
ANION GAP: 8 (ref 5–15)
AST: 23 U/L (ref 15–41)
Alkaline Phosphatase: 76 U/L (ref 38–126)
BILIRUBIN TOTAL: 0.2 mg/dL — AB (ref 0.3–1.2)
BUN: 29 mg/dL — ABNORMAL HIGH (ref 6–20)
CO2: 22 mmol/L (ref 22–32)
Calcium: 9.3 mg/dL (ref 8.9–10.3)
Chloride: 107 mmol/L (ref 101–111)
Creatinine, Ser: 1.55 mg/dL — ABNORMAL HIGH (ref 0.44–1.00)
GFR calc Af Amer: 36 mL/min — ABNORMAL LOW (ref >60)
GFR calc non Af Amer: 31 mL/min — ABNORMAL LOW (ref >60)
GLUCOSE: 125 mg/dL — AB (ref 65–99)
POTASSIUM: 4.2 mmol/L (ref 3.5–5.1)
SODIUM: 137 mmol/L (ref 135–145)
TOTAL PROTEIN: 7.2 g/dL (ref 6.5–8.1)

## 2015-03-21 LAB — CBC WITH DIFFERENTIAL/PLATELET
BASOS PCT: 1 %
Basophils Absolute: 0 10*3/uL (ref 0–0.1)
EOS ABS: 0.1 10*3/uL (ref 0–0.7)
EOS PCT: 2 %
HEMATOCRIT: 38.2 % (ref 35.0–47.0)
Hemoglobin: 12.9 g/dL (ref 12.0–16.0)
Lymphocytes Relative: 21 %
Lymphs Abs: 1.7 10*3/uL (ref 1.0–3.6)
MCH: 32 pg (ref 26.0–34.0)
MCHC: 33.8 g/dL (ref 32.0–36.0)
MCV: 94.9 fL (ref 80.0–100.0)
MONO ABS: 0.6 10*3/uL (ref 0.2–0.9)
MONOS PCT: 7 %
Neutro Abs: 5.4 10*3/uL (ref 1.4–6.5)
Neutrophils Relative %: 69 %
Platelets: 203 10*3/uL (ref 150–440)
RBC: 4.02 MIL/uL (ref 3.80–5.20)
RDW: 13 % (ref 11.5–14.5)
WBC: 7.9 10*3/uL (ref 3.6–11.0)

## 2015-03-21 MED ORDER — CEPHALEXIN 500 MG PO CAPS: 500.0000 mg | ORAL_CAPSULE | Freq: Three times a day (TID) | ORAL | Status: DC

## 2015-03-21 NOTE — Discharge Instructions (Signed)

## 2015-03-21 NOTE — ED Provider Notes (Signed)
[facility] Emergency Department Provider Note  ____________________________________________  Time seen: 4:45 PM  I have reviewed the triage vital signs and the nursing notes.   HISTORY  Chief Complaint Abdominal Pain    HPI Karla Little is a 81 y.o. female who comes to the ED today complaining of lower abdominal and pelvic pain for the past 4-5 days. It's intermittent worse with being upright and at times radiates to the back. No nausea vomiting or diarrhea. No chest pain shortness of breath dizziness or syncope. Denies dysuria frequency urgency.     Past Medical History  Diagnosis Date  . Hyperlipidemia   . Osteoporosis   . Hypertension   . Vertigo      Patient Active Problem List   Diagnosis Date Noted  . Atrophy of right kidney 03/16/2015  . Chronic kidney disease, stage 4, severely decreased GFR (HCC) 03/16/2015  . Shoulder bursitis 03/16/2015  . Hypothyroidism   . Hyperlipidemia   . Hypertension      Past Surgical History  Procedure Laterality Date  . Breast biopsy    . Colonoscopy w/ polypectomy       Current Outpatient Rx  Name  Route  Sig  Dispense  Refill  . amLODipine (NORVASC) 10 MG tablet   Oral   Take 1 tablet (10 mg total) by mouth daily.   90 tablet   4   . atorvastatin (LIPITOR) 40 MG tablet   Oral   Take 1 tablet (40 mg total) by mouth daily.   90 tablet   4   . Calcium Citrate-Vitamin D (CALCIUM + D PO)   Oral   Take by mouth daily.         . cephALEXin (KEFLEX) 500 MG capsule   Oral   Take 1 capsule (500 mg total) by mouth 3 (three) times daily.   21 capsule   0   . hydrALAZINE (APRESOLINE) 25 MG tablet   Oral   Take 2 tablets (50 mg total) by mouth daily.   90 tablet   4   . levothyroxine (SYNTHROID, LEVOTHROID) 50 MCG tablet   Oral   Take 1 tablet (50 mcg total) by mouth daily.   90 tablet   4   . lisinopril (PRINIVIL,ZESTRIL) 10 MG tablet   Oral   Take 1 tablet (10 mg total) by  mouth daily.   90 tablet   4   . nitroGLYCERIN (NITROSTAT) 0.4 MG SL tablet   Sublingual   Place 0.4 mg under the tongue every 5 (five) minutes as needed for chest pain.         . sodium bicarbonate 650 MG tablet               . vitamin B-12 (CYANOCOBALAMIN) 250 MCG tablet   Oral   Take 250 mcg by mouth daily.         . Vitamin D, Ergocalciferol, (DRISDOL) 50000 UNITS CAPS capsule   Oral   Take 50,000 Units by mouth every 7 (seven) days.         . vitamin E 200 UNIT capsule   Oral   Take 200 Units by mouth daily.            Allergies Review of patient's allergies indicates no known allergies.   Family History  Problem Relation Age of Onset  . Hypertension Mother   . Heart disease Father   . Cancer Sister     breast    Social  History Social History  Substance Use Topics  . Smoking status: Never Smoker   . Smokeless tobacco: Never Used  . Alcohol Use: No    Review of Systems  Constitutional:   No fever or chills. No weight changes Eyes:   No blurry vision or double vision.  ENT:   No sore throat. Cardiovascular:   No chest pain. Respiratory:   No dyspnea or cough. Gastrointestinal:   Pelvic pain as above without, vomiting and diarrhea.  No BRBPR or melena. Genitourinary:   Negative for dysuria, urinary retention, bloody urine, or difficulty urinating. Musculoskeletal:   Negative for back pain. No joint swelling or pain. Skin:   Negative for rash. Neurological:   Negative for headaches, focal weakness or numbness. Psychiatric:  No anxiety or depression.   Endocrine:  No hot/cold intolerance, changes in energy, or sleep difficulty.  10-point ROS otherwise negative.  ____________________________________________   PHYSICAL EXAM:  VITAL SIGNS: ED Triage Vitals  Enc Vitals Group     BP 03/21/15 1448 174/64 mmHg     Pulse Rate 03/21/15 1448 78     Resp 03/21/15 1448 16     Temp 03/21/15 1448 98.4 F (36.9 C)     Temp Source 03/21/15 1448  Oral     SpO2 03/21/15 1448 97 %     Weight 03/21/15 1448 190 lb (86.183 kg)     Height 03/21/15 1448 5\' 7"  (1.702 m)     Head Cir --      Peak Flow --      Pain Score 03/21/15 1458 0     Pain Loc --      Pain Edu? --      Excl. in [place]? --      Constitutional:   Alert and oriented. Well appearing and in no distress. Eyes:   No scleral icterus. No conjunctival pallor. PERRL. EOMI ENT   Head:   Normocephalic and atraumatic.   Nose:   No congestion/rhinnorhea. No septal hematoma   Mouth/Throat:   MMM, no pharyngeal erythema. No peritonsillar mass. No uvula shift.   Neck:   No stridor. No SubQ emphysema. No meningismus. Hematological/Lymphatic/Immunilogical:   No cervical lymphadenopathy. Cardiovascular:   RRR. Normal and symmetric distal pulses are present in all extremities. No murmurs, rubs, or gallops. Respiratory:   Normal respiratory effort without tachypnea nor retractions. Breath sounds are clear and equal bilaterally. No wheezes/rales/rhonchi. Gastrointestinal:   Soft and nontender. No distention. There is no CVA tenderness.  No rebound, rigidity, or guarding. Genitourinary:   deferred Musculoskeletal:   Nontender with normal range of motion in all extremities. No joint effusions.  No lower extremity tenderness.  No edema. Neurologic:   Normal speech and language.  CN 2-10 normal. Motor grossly intact. No pronator drift.  Normal gait. No gross focal neurologic deficits are appreciated.  Skin:    Skin is warm, dry and intact. No rash noted.  No petechiae, purpura, or bullae. Psychiatric:   Mood and affect are normal. Speech and behavior are normal. Patient exhibits appropriate insight and judgment.  ____________________________________________    LABS (pertinent positives/negatives) (all labs ordered are listed, but only abnormal results are displayed) Labs Reviewed  COMPREHENSIVE METABOLIC PANEL - Abnormal; Notable for the following:    Glucose, Bld 125 (*)     BUN 29 (*)    Creatinine, Ser 1.55 (*)    Total Bilirubin 0.2 (*)    GFR calc non Af Amer 31 (*)    GFR calc Af  Amer 36 (*)    All other components within normal limits  URINALYSIS COMPLETEWITH MICROSCOPIC ([facility] ONLY) - Abnormal; Notable for the following:    Color, Urine STRAW (*)    APPearance HAZY (*)    Specific Gravity, Urine 1.004 (*)    Hgb urine dipstick 1+ (*)    Leukocytes, UA 3+ (*)    Bacteria, UA RARE (*)    Squamous Epithelial / LPF 0-5 (*)    All other components within normal limits  URINE CULTURE  CBC WITH DIFFERENTIAL/PLATELET   ____________________________________________   EKG    ____________________________________________    RADIOLOGY    ____________________________________________   PROCEDURES   ____________________________________________   INITIAL IMPRESSION / ASSESSMENT AND PLAN / ED COURSE  Pertinent labs & imaging results that were available during my care of the patient were reviewed by me and considered in my medical decision making (see chart for details).  Patient presents with pelvic discomfort is worse with walking and ambulation. Exam is very reassuring as are vital signs and blood tests. However, the patient does have an obvious urinary tract infection on urinalysis. We'll start her on Keflex and sent a urine culture and have her follow-up with her primary care doctor in about a week. Have a history of chronic renal insufficiency, which does not appear to acutely worsen. No evidence of sepsis or pyelonephritis.     ____________________________________________   FINAL CLINICAL IMPRESSION(S) / ED DIAGNOSES  Final diagnoses:  Cystitis      Carrie Mew, MD 03/21/15 1711

## 2015-03-21 NOTE — ED Notes (Signed)
C/o lower abdominal pain.  Onset of symptoms last Thursday.  Pain has been intermittent, worse with walking.   Patient called Dr. Edd Fabian office to schedule a colonoscopy and was referred to ED for evaluation.

## 2015-03-23 LAB — URINE CULTURE: Culture: 80000

## 2015-03-29 ENCOUNTER — Telehealth: Payer: Self-pay | Admitting: Family Medicine

## 2015-03-29 MED ORDER — SULFAMETHOXAZOLE-TRIMETHOPRIM 800-160 MG PO TABS: 1.0000 | ORAL_TABLET | Freq: Two times a day (BID) | ORAL | Status: DC

## 2015-03-29 NOTE — Telephone Encounter (Signed)
Pt has been to urgent care for a uti and it hasnt gotten any better and she was told to give [place] a call if things didn't get any better. i offered her an appt but she didn't want to schedule anything before s[eaking with Dr Jeananne Rama.

## 2015-03-29 NOTE — Telephone Encounter (Signed)
Phone call Patient treated and released from the emergency room for urinary tract infection with Cipro Patient not getting better reviewed culture and sensitivity report which shows bacteria sensitive to Cipro Also sensitive to Septra will change medications to Septra and observe.

## 2015-04-19 ENCOUNTER — Telehealth: Payer: Self-pay | Admitting: Family Medicine

## 2015-04-19 NOTE — Telephone Encounter (Signed)
Please have MAC call when he gets a chance no further information provided. Thanks.

## 2015-04-19 NOTE — Telephone Encounter (Signed)
Phone call Discussed with patient left lower quadrant abdominal pains gotten worse over the last few days was pretty intense earlier today. Patient still having bowel movements but wondering if it is some constipation Will take an enema or suppository stool softener If after still good bowel movement will need to check.

## 2015-05-05 DIAGNOSIS — E785 Hyperlipidemia, unspecified: Secondary | ICD-10-CM | POA: Insufficient documentation

## 2015-05-05 DIAGNOSIS — E039 Hypothyroidism, unspecified: Secondary | ICD-10-CM | POA: Insufficient documentation

## 2015-05-09 ENCOUNTER — Telehealth: Payer: Self-pay | Admitting: Family Medicine

## 2015-05-09 ENCOUNTER — Ambulatory Visit
Admission: RE | Admit: 2015-05-09 | Discharge: 2015-05-09 | Disposition: A | Discharge status: Home / Self Care | Payer: Medicare Other | Source: Ambulatory Visit | Attending: Nurse Practitioner | Admitting: Nurse Practitioner

## 2015-05-09 ENCOUNTER — Other Ambulatory Visit: Payer: Self-pay | Admitting: Nurse Practitioner

## 2015-05-09 DIAGNOSIS — R9389 Abnormal findings on diagnostic imaging of other specified body structures: Secondary | ICD-10-CM

## 2015-05-09 DIAGNOSIS — R1032 Left lower quadrant pain: Secondary | ICD-10-CM | POA: Diagnosis not present

## 2015-05-09 DIAGNOSIS — R938 Abnormal findings on diagnostic imaging of other specified body structures: Secondary | ICD-10-CM | POA: Diagnosis not present

## 2015-05-09 DIAGNOSIS — Z86010 Personal history of colonic polyps: Secondary | ICD-10-CM | POA: Diagnosis not present

## 2015-05-09 DIAGNOSIS — N184 Chronic kidney disease, stage 4 (severe): Secondary | ICD-10-CM | POA: Diagnosis not present

## 2015-05-09 DIAGNOSIS — D259 Leiomyoma of uterus, unspecified: Secondary | ICD-10-CM | POA: Insufficient documentation

## 2015-05-09 NOTE — Telephone Encounter (Signed)
Pt had X-ray today, the MD from [facility] told her she had a thickening of the uterus.  She would like Dr Rance Muir recommendation of who to see for this.

## 2015-05-12 DIAGNOSIS — N858 Other specified noninflammatory disorders of uterus: Secondary | ICD-10-CM | POA: Diagnosis not present

## 2015-05-12 DIAGNOSIS — N888 Other specified noninflammatory disorders of cervix uteri: Secondary | ICD-10-CM | POA: Diagnosis not present

## 2015-05-12 DIAGNOSIS — R1032 Left lower quadrant pain: Secondary | ICD-10-CM | POA: Diagnosis not present

## 2015-05-12 DIAGNOSIS — R938 Abnormal findings on diagnostic imaging of other specified body structures: Secondary | ICD-10-CM | POA: Diagnosis not present

## 2015-05-12 DIAGNOSIS — N840 Polyp of corpus uteri: Secondary | ICD-10-CM | POA: Diagnosis not present

## 2015-05-12 DIAGNOSIS — Z1211 Encounter for screening for malignant neoplasm of colon: Secondary | ICD-10-CM | POA: Diagnosis not present

## 2015-06-01 ENCOUNTER — Encounter: Payer: Self-pay | Admitting: *Deleted

## 2015-06-02 ENCOUNTER — Ambulatory Visit: Payer: Medicare Other | Admitting: Anesthesiology

## 2015-06-02 ENCOUNTER — Encounter
Admission: RE | Disposition: A | Discharge status: Home / Self Care | Payer: Self-pay | Source: Ambulatory Visit | Attending: Unknown Physician Specialty

## 2015-06-02 ENCOUNTER — Encounter: Payer: Self-pay | Admitting: *Deleted

## 2015-06-02 ENCOUNTER — Ambulatory Visit
Admission: RE | Admit: 2015-06-02 | Discharge: 2015-06-02 | Disposition: A | Discharge status: Home / Self Care | Payer: Medicare Other | Source: Ambulatory Visit | Attending: Unknown Physician Specialty | Admitting: Unknown Physician Specialty

## 2015-06-02 DIAGNOSIS — D120 Benign neoplasm of cecum: Secondary | ICD-10-CM | POA: Insufficient documentation

## 2015-06-02 DIAGNOSIS — I129 Hypertensive chronic kidney disease with stage 1 through stage 4 chronic kidney disease, or unspecified chronic kidney disease: Secondary | ICD-10-CM | POA: Insufficient documentation

## 2015-06-02 DIAGNOSIS — K5730 Diverticulosis of large intestine without perforation or abscess without bleeding: Secondary | ICD-10-CM | POA: Diagnosis not present

## 2015-06-02 DIAGNOSIS — E039 Hypothyroidism, unspecified: Secondary | ICD-10-CM | POA: Insufficient documentation

## 2015-06-02 DIAGNOSIS — M810 Age-related osteoporosis without current pathological fracture: Secondary | ICD-10-CM | POA: Insufficient documentation

## 2015-06-02 DIAGNOSIS — K640 First degree hemorrhoids: Secondary | ICD-10-CM | POA: Diagnosis not present

## 2015-06-02 DIAGNOSIS — Z1211 Encounter for screening for malignant neoplasm of colon: Secondary | ICD-10-CM | POA: Diagnosis not present

## 2015-06-02 DIAGNOSIS — K635 Polyp of colon: Secondary | ICD-10-CM | POA: Diagnosis not present

## 2015-06-02 DIAGNOSIS — N189 Chronic kidney disease, unspecified: Secondary | ICD-10-CM | POA: Diagnosis not present

## 2015-06-02 DIAGNOSIS — K648 Other hemorrhoids: Secondary | ICD-10-CM | POA: Diagnosis not present

## 2015-06-02 DIAGNOSIS — Z86010 Personal history of colonic polyps: Secondary | ICD-10-CM | POA: Diagnosis not present

## 2015-06-02 DIAGNOSIS — E785 Hyperlipidemia, unspecified: Secondary | ICD-10-CM | POA: Insufficient documentation

## 2015-06-02 DIAGNOSIS — Z79899 Other long term (current) drug therapy: Secondary | ICD-10-CM | POA: Diagnosis not present

## 2015-06-02 DIAGNOSIS — K5790 Diverticulosis of intestine, part unspecified, without perforation or abscess without bleeding: Secondary | ICD-10-CM | POA: Diagnosis not present

## 2015-06-02 HISTORY — PX: COLONOSCOPY WITH PROPOFOL: SHX5780

## 2015-06-02 HISTORY — DX: Hypothyroidism, unspecified: E03.9

## 2015-06-02 SURGERY — COLONOSCOPY WITH PROPOFOL
Anesthesia: General

## 2015-06-02 MED ORDER — FENTANYL CITRATE (PF) 100 MCG/2ML IJ SOLN
INTRAMUSCULAR | Status: DC | PRN
  Administered 2015-06-02: 50 ug via INTRAVENOUS

## 2015-06-02 MED ORDER — SODIUM CHLORIDE 0.9 % IV SOLN
INTRAVENOUS | Status: DC
Start: 2015-06-02 — End: 2015-06-02
  Administered 2015-06-02: 13:00:00 via INTRAVENOUS

## 2015-06-02 MED ORDER — EPHEDRINE SULFATE 50 MG/ML IJ SOLN
INTRAMUSCULAR | Status: DC | PRN
  Administered 2015-06-02 (×2): 5 mg via INTRAVENOUS

## 2015-06-02 MED ORDER — MIDAZOLAM HCL 2 MG/2ML IJ SOLN
INTRAMUSCULAR | Status: DC | PRN
  Administered 2015-06-02: 1 mg via INTRAVENOUS

## 2015-06-02 MED ORDER — SODIUM CHLORIDE 0.9 % IV SOLN
INTRAVENOUS | Status: DC
Start: 2015-06-02 — End: 2015-06-02
  Administered 2015-06-02: 1000 mL via INTRAVENOUS

## 2015-06-02 MED ORDER — PROPOFOL 500 MG/50ML IV EMUL
INTRAVENOUS | Status: DC | PRN
  Administered 2015-06-02: 120 ug/kg/min via INTRAVENOUS

## 2015-06-02 NOTE — Anesthesia Postprocedure Evaluation (Deleted)
Anesthesia Post Note  Patient: Karla Little  Procedure(s) Performed: Procedure(s) (LRB): COLONOSCOPY WITH PROPOFOL (N/A)  Patient location during evaluation: Endoscopy Anesthesia Type: General Level of consciousness: awake and alert Pain management: pain level controlled Vital Signs Assessment: post-procedure vital signs reviewed and stable Respiratory status: spontaneous breathing, nonlabored ventilation, respiratory function stable and patient connected to nasal cannula oxygen Cardiovascular status: blood pressure returned to baseline and stable Postop Assessment: no signs of nausea or vomiting Anesthetic complications: no    Last Vitals:  Filed Vitals:   06/02/15 1409 06/02/15 1419  BP: 132/69 112/95  Pulse: 75 74  Temp:    Resp: 21 15    Last Pain: There were no vitals filed for this visit.               Precious Haws Piscitello

## 2015-06-02 NOTE — Transfer of Care (Signed)
Immediate Anesthesia Transfer of Care Note  Patient: Karla Little  Procedure(s) Performed: Procedure(s): COLONOSCOPY WITH PROPOFOL (N/A)  Patient Location: PACU  Anesthesia Type:General  Level of Consciousness: awake, alert  and sedated  Airway & Oxygen Therapy: Patient Spontanous Breathing and Patient connected to nasal cannula oxygen  Post-op Assessment: Report given to RN and Post -op Vital signs reviewed and stable  Post vital signs: Reviewed and stable  Last Vitals:  Filed Vitals:   06/02/15 1234 06/02/15 1349  BP: 154/66 119/49  Pulse: 88   Temp: 36.5 C 36.1 C  Resp: 18     Complications: No apparent anesthesia complications

## 2015-06-02 NOTE — H&P (Signed)
Primary Care Physician:  Golden Pop, MD Primary Gastroenterologist:  Dr. Vira Agar  Pre-Procedure History & Physical: HPI:  Karla Little is a 81 y.o. female is here for an colonoscopy.   Past Medical History  Diagnosis Date  . Hyperlipidemia   . Osteoporosis   . Hypertension   . Vertigo   . Chronic kidney disease   . Hypothyroidism     Past Surgical History  Procedure Laterality Date  . Breast biopsy    . Colonoscopy w/ polypectomy      Prior to Admission medications   Medication Sig Start Date End Date Taking? Authorizing Provider  amLODipine (NORVASC) 10 MG tablet Take 1 tablet (10 mg total) by mouth daily. 03/16/15  Yes [name], MD  atorvastatin (LIPITOR) 40 MG tablet Take 1 tablet (40 mg total) by mouth daily. 03/16/15  Yes [name], MD  levothyroxine (SYNTHROID, LEVOTHROID) 50 MCG tablet Take 1 tablet (50 mcg total) by mouth daily. 03/16/15  Yes [name], MD  Calcium Citrate-Vitamin D (CALCIUM + D PO) Take by mouth daily.    Historical Provider, MD  cephALEXin (KEFLEX) 500 MG capsule Take 1 capsule (500 mg total) by mouth 3 (three) times daily. 03/21/15   Carrie Mew, MD  hydrALAZINE (APRESOLINE) 25 MG tablet Take 2 tablets (50 mg total) by mouth daily. 03/16/15   [name], MD  lisinopril (PRINIVIL,ZESTRIL) 10 MG tablet Take 1 tablet (10 mg total) by mouth daily. 03/16/15   [name], MD  nitroGLYCERIN (NITROSTAT) 0.4 MG SL tablet Place 0.4 mg under the tongue every 5 (five) minutes as needed for chest pain.    Historical Provider, MD  sodium bicarbonate 650 MG tablet  02/25/15   Historical Provider, MD  sulfamethoxazole-trimethoprim (BACTRIM DS,SEPTRA DS) 800-160 MG tablet Take 1 tablet by mouth 2 (two) times daily. 03/29/15   [name], MD  vitamin B-12 (CYANOCOBALAMIN) 250 MCG tablet Take 250 mcg by mouth daily.    Historical Provider, MD  Vitamin D, Ergocalciferol, (DRISDOL) 50000 UNITS CAPS capsule Take 50,000 Units  by mouth every 7 (seven) days.    Historical Provider, MD  vitamin E 200 UNIT capsule Take 200 Units by mouth daily.    Historical Provider, MD    Allergies as of 05/12/2015  . (No Known Allergies)    Family History  Problem Relation Age of Onset  . Hypertension Mother   . Heart disease Father   . Cancer Sister     breast    Social History   Social History  . Marital Status: Married    Spouse Name: N/A  . Number of Children: N/A  . Years of Education: N/A   Occupational History  . Not on file.   Social History Main Topics  . Smoking status: Never Smoker   . Smokeless tobacco: Never Used  . Alcohol Use: No  . Drug Use: No  . Sexual Activity: Not on file   Other Topics Concern  . Not on file   Social History Narrative    Review of Systems: See HPI, otherwise negative ROS  Physical Exam: BP 154/66 mmHg  Pulse 88  Temp(Src) 97.7 F (36.5 C) (Oral)  Resp 18  Ht 5\' 9"  (1.753 m)  Wt 86.183 kg (190 lb)  BMI 28.05 kg/m2  SpO2 100% General:   Alert,  pleasant and cooperative in NAD Head:  Normocephalic and atraumatic. Neck:  Supple; no masses or thyromegaly. Lungs:  Clear throughout to auscultation.  Heart:  Regular rate and rhythm. Abdomen:  Soft, nontender and nondistended. Normal bowel sounds, without guarding, and without rebound.   Neurologic:  Alert and  oriented x4;  grossly normal neurologically.  Impression/Plan: [name] is here for an colonoscopy to be performed for [name] colon polyps  Risks, benefits, limitations, and alternatives regarding  colonoscopy have been reviewed with the patient.  Questions have been answered.  All parties agreeable.   Gaylyn Cheers, MD  06/02/2015, 1:07 PM

## 2015-06-02 NOTE — Op Note (Signed)
[facility] Gastroenterology Patient Name: Karla Little Procedure Date: 06/02/2015 1:06 PM MRN: [id number] Account #: [id number] Date of Birth: 1934-01-14 Admit Type: Outpatient Age: 81 Room: [facility] ENDO ROOM 1 Gender: Female Note Status: Finalized Procedure:         Colonoscopy Indications:       High risk colon cancer surveillance: Personal history of                     colonic polyps Providers:         Manya Silvas, MD Referring MD:      [name], MD (Referring MD) Medicines:         Propofol per Anesthesia Complications:     No immediate complications. Procedure:         Pre-Anesthesia Assessment:                    - After reviewing the risks and benefits, the patient was                     deemed in satisfactory condition to undergo the procedure.                    After obtaining informed consent, the colonoscope was                     passed under direct vision. Throughout the procedure, the                     patient's blood pressure, pulse, and oxygen saturations                     were monitored continuously. The Colonoscope was                     introduced through the anus and advanced to the the cecum,                     identified by appendiceal orifice and ileocecal valve. The                     colonoscopy was somewhat difficult due to restricted                     mobility of the colon, significant looping and a tortuous                     colon. Successful completion of the procedure was aided by                     applying abdominal pressure. The patient tolerated the                     procedure well. The quality of the bowel preparation was                     good. Findings:      Multiple medium-mouthed diverticula were found in the sigmoid colon and       in the descending colon.      Internal hemorrhoids were found during endoscopy. The hemorrhoids were       small and Grade I (internal hemorrhoids that do not  prolapse).      Two sessile polyps were found in the cecum. The polyps were small  in       size. These polyps were removed with a hot snare. Resection and       retrieval were complete.      The exam was otherwise without abnormality. Impression:        - Diverticulosis in the sigmoid colon and in the                     descending colon.                    - Internal hemorrhoids.                    - Two small polyps in the cecum. Resected and retrieved.                    - The examination was otherwise normal. Recommendation:    - Await pathology results. Manya Silvas, MD 06/02/2015 1:45:38 PM This report has been signed electronically. Number of Addenda: 0 Note Initiated On: 06/02/2015 1:06 PM Scope Withdrawal Time: 0 hours 13 minutes 43 seconds  Total Procedure Duration: 0 hours 27 minutes 47 seconds       [facility]

## 2015-06-02 NOTE — Anesthesia Preprocedure Evaluation (Signed)
Anesthesia Evaluation  Patient identified by MRN, date of birth, ID band Patient awake    Reviewed: Allergy & Precautions, H&P , NPO status , Patient's Chart, lab work & pertinent test results  History of Anesthesia Complications Negative for: history of anesthetic complications  Airway Mallampati: III  TM Distance: >3 FB Neck ROM: full    Dental  (+) Poor Dentition, Chipped   Pulmonary neg pulmonary ROS, neg shortness of breath,    Pulmonary exam normal breath sounds clear to auscultation       Cardiovascular Exercise Tolerance: Good hypertension, (-) angina(-) Past MI and (-) DOE Normal cardiovascular exam Rhythm:regular Rate:Normal     Neuro/Psych negative neurological ROS  negative psych ROS   GI/Hepatic negative GI ROS, Neg liver ROS,   Endo/Other  Hypothyroidism   Renal/GU Renal disease  negative genitourinary   Musculoskeletal   Abdominal   Peds  Hematology negative hematology ROS (+)   Anesthesia Other Findings Past Medical History:   Hyperlipidemia                                               Osteoporosis                                                 Hypertension                                                 Vertigo                                                      Chronic kidney disease                                       Hypothyroidism                                              Past Surgical History:   BREAST BIOPSY                                                 COLONOSCOPY W/ POLYPECTOMY                                   BMI    Body Mass Index   28.04 kg/m 2      Reproductive/Obstetrics negative OB ROS                             Anesthesia Physical Anesthesia Plan  ASA: III  Anesthesia Plan: General   Post-op Pain Management:    Induction:   Airway Management Planned:   Additional Equipment:   Intra-op Plan:   Post-operative Plan:    Informed Consent: I have reviewed the patients History and Physical, chart, labs and discussed the procedure including the risks, benefits and alternatives for the proposed anesthesia with the patient or authorized representative who has indicated his/her understanding and acceptance.   Dental Advisory Given  Plan Discussed with: Anesthesiologist, CRNA and Surgeon  Anesthesia Plan Comments:         Anesthesia Quick Evaluation

## 2015-06-02 NOTE — Anesthesia Procedure Notes (Signed)
Performed by: COOK-MARTIN, Shakirah Kirkey Pre-anesthesia Checklist: Patient identified, Emergency Drugs available, Suction available, Patient being monitored and Timeout performed Patient Re-evaluated:Patient Re-evaluated prior to inductionOxygen Delivery Method: Nasal cannula Preoxygenation: Pre-oxygenation with 100% oxygen Intubation Type: IV induction Placement Confirmation: positive ETCO2 and CO2 detector       

## 2015-06-03 NOTE — Anesthesia Postprocedure Evaluation (Signed)
Anesthesia Post Note  Patient: Karla Little  Procedure(s) Performed: Procedure(s) (LRB): COLONOSCOPY WITH PROPOFOL (N/A)  Patient location during evaluation: Endoscopy Anesthesia Type: General Level of consciousness: awake and alert Pain management: pain level controlled Vital Signs Assessment: post-procedure vital signs reviewed and stable Respiratory status: spontaneous breathing, nonlabored ventilation, respiratory function stable and patient connected to nasal cannula oxygen Cardiovascular status: blood pressure returned to baseline and stable Postop Assessment: no signs of nausea or vomiting Anesthetic complications: no    Last Vitals:  Filed Vitals:   06/02/15 1409 06/02/15 1419  BP: 132/69 112/95  Pulse: 75 74  Temp:    Resp: 21 15    Last Pain: There were no vitals filed for this visit.               Precious Haws Piscitello

## 2015-06-07 ENCOUNTER — Encounter: Payer: Self-pay | Admitting: Unknown Physician Specialty

## 2015-06-07 LAB — SURGICAL PATHOLOGY

## 2015-06-22 DIAGNOSIS — N184 Chronic kidney disease, stage 4 (severe): Secondary | ICD-10-CM | POA: Diagnosis not present

## 2015-06-22 DIAGNOSIS — N2581 Secondary hyperparathyroidism of renal origin: Secondary | ICD-10-CM | POA: Diagnosis not present

## 2015-06-22 DIAGNOSIS — I10 Essential (primary) hypertension: Secondary | ICD-10-CM | POA: Diagnosis not present

## 2015-06-27 DIAGNOSIS — N2581 Secondary hyperparathyroidism of renal origin: Secondary | ICD-10-CM | POA: Diagnosis not present

## 2015-06-27 DIAGNOSIS — I10 Essential (primary) hypertension: Secondary | ICD-10-CM | POA: Diagnosis not present

## 2015-06-27 DIAGNOSIS — E872 Acidosis: Secondary | ICD-10-CM | POA: Diagnosis not present

## 2015-06-27 DIAGNOSIS — N183 Chronic kidney disease, stage 3 (moderate): Secondary | ICD-10-CM | POA: Diagnosis not present

## 2015-08-25 DIAGNOSIS — Z85828 Personal history of other malignant neoplasm of skin: Secondary | ICD-10-CM | POA: Diagnosis not present

## 2015-08-25 DIAGNOSIS — L821 Other seborrheic keratosis: Secondary | ICD-10-CM | POA: Diagnosis not present

## 2015-08-25 DIAGNOSIS — Z08 Encounter for follow-up examination after completed treatment for malignant neoplasm: Secondary | ICD-10-CM | POA: Diagnosis not present

## 2015-08-25 DIAGNOSIS — L820 Inflamed seborrheic keratosis: Secondary | ICD-10-CM | POA: Diagnosis not present

## 2015-09-13 ENCOUNTER — Ambulatory Visit: Payer: Medicare Other | Admitting: Family Medicine

## 2015-10-18 ENCOUNTER — Encounter: Payer: Self-pay | Admitting: Cardiology

## 2015-10-18 ENCOUNTER — Ambulatory Visit (INDEPENDENT_AMBULATORY_CARE_PROVIDER_SITE_OTHER): Payer: Medicare Other | Admitting: Cardiology

## 2015-10-18 VITALS — BP 140/60 | HR 90 | Ht 69.0 in | Wt 200.4 lb

## 2015-10-18 DIAGNOSIS — R0602 Shortness of breath: Secondary | ICD-10-CM

## 2015-10-18 DIAGNOSIS — N183 Chronic kidney disease, stage 3 (moderate): Secondary | ICD-10-CM | POA: Diagnosis not present

## 2015-10-18 DIAGNOSIS — R002 Palpitations: Secondary | ICD-10-CM | POA: Diagnosis not present

## 2015-10-18 DIAGNOSIS — I951 Orthostatic hypotension: Secondary | ICD-10-CM

## 2015-10-18 DIAGNOSIS — I10 Essential (primary) hypertension: Secondary | ICD-10-CM

## 2015-10-18 DIAGNOSIS — E872 Acidosis: Secondary | ICD-10-CM | POA: Diagnosis not present

## 2015-10-18 DIAGNOSIS — Z7189 Other specified counseling: Secondary | ICD-10-CM | POA: Diagnosis not present

## 2015-10-18 DIAGNOSIS — N2581 Secondary hyperparathyroidism of renal origin: Secondary | ICD-10-CM | POA: Diagnosis not present

## 2015-10-18 NOTE — Patient Instructions (Addendum)
Medication Instructions:  Your physician recommends that you continue on your current medications as directed. Please refer to the Current Medication list given to you today.   Labwork: None ordered  Testing/Procedures: Your physician has requested that you have an echocardiogram. Echocardiography is a painless test that uses sound waves to create images of your heart. It provides your doctor with information about the size and shape of your heart and how well your heart's chambers and valves are working. This procedure takes approximately one hour. There are no restrictions for this procedure.  Date & Time: ____________________________________________________________________  [facility] Stress Test  Your caregiver has ordered a Stress Test with nuclear imaging. The purpose of this test is to evaluate the blood supply to your heart muscle. Cardiac stress tests are done to find areas of poor blood flow to the heart by determining the extent of coronary artery disease (CAD).   Please note: these test may take anywhere between 2-4 hours to complete  PLEASE REPORT TO [facility] AT THE FIRST DESK WILL DIRECT YOU WHERE TO GO  Date of Procedure:_Wednesday Nov 02, 2015 at 07:30AM_____  Arrival Time for Procedure:__Arrive at 07:15AM to register______    PLEASE NOTIFY THE OFFICE AT LEAST 24 HOURS IN ADVANCE IF YOU ARE UNABLE TO [place].  [phone] AND  PLEASE NOTIFY NUCLEAR MEDICINE AT [facility] AT LEAST 24 HOURS IN ADVANCE IF YOU ARE UNABLE TO KEEP YOUR APPOINTMENT. [phone]  How to prepare for your Myoview test:   Do not eat or drink after midnight  No caffeine for 24 hours prior to test  No smoking 24 hours prior to test.  Your medication may be taken with water.  If your doctor stopped a medication because of this test, do not take that medication.  Ladies, please do not wear dresses.  Skirts or pants are appropriate. Please wear a short  sleeve shirt.  No perfume, cologne or lotion.  Wear comfortable walking shoes. No heels!    Follow-Up: Your physician recommends that you schedule a follow-up appointment after testing to review results.  Date & Time: ______________________________________________________________________    Any Other Special Instructions Will Be Listed Below (If Applicable).     If you need a refill on your cardiac medications before your next appointment, please call your pharmacy.  Echocardiogram An echocardiogram, or echocardiography, uses sound waves (ultrasound) to produce an image of your heart. The echocardiogram is simple, painless, obtained within a short period of time, and offers valuable information to your health care provider. The images from an echocardiogram can provide information such as:  Evidence of coronary artery disease (CAD).  Heart size.  Heart muscle function.  Heart valve function.  Aneurysm detection.  Evidence of a past heart attack.  Fluid buildup around the heart.  Heart muscle thickening.  Assess heart valve function. LET [facility] CARE PROVIDER KNOW ABOUT:  Any allergies you have.  All medicines you are taking, including vitamins, herbs, eye drops, creams, and over-the-counter medicines.  Previous problems you or members of your family have had with the use of anesthetics.  Any blood disorders you have.  Previous surgeries you have had.  Medical conditions you have.  Possibility of pregnancy, if this applies. BEFORE THE PROCEDURE  No special preparation is needed. Eat and drink normally.  PROCEDURE   In order to produce an image of your heart, gel will be applied to your chest and a wand-like tool (transducer) will be moved over  your chest. The gel will help transmit the sound waves from the transducer. The sound waves will harmlessly bounce off your heart to allow the heart images to be captured in real-time motion. These images will  then be recorded.  You may need an IV to receive a medicine that improves the quality of the pictures. AFTER THE PROCEDURE You may return to your normal schedule including diet, activities, and medicines, unless your health care provider tells you otherwise.   This information is not intended to replace advice given to you by your health care provider. Make sure you discuss any questions you have with your health care provider.   Document Released: 05/18/2000 Document Revised: 06/11/2014 Document Reviewed: 01/26/2013 Elsevier Interactive Patient Education 2016 [place].   Cardiac Nuclear Scanning A cardiac nuclear scan is used to check your heart for problems, such as the following:  A portion of the heart is not getting enough blood.  Part of the heart muscle has died, which happens with a heart attack.  The heart wall is not working normally.  In this test, a radioactive dye (tracer) is injected into your bloodstream. After the tracer has traveled to your heart, a scanning device is used to measure how much of the tracer is absorbed by or distributed to various areas of your heart. LET [facility] CARE PROVIDER KNOW ABOUT:  Any allergies you have.  All medicines you are taking, including vitamins, herbs, eye drops, creams, and over-the-counter medicines.  Previous problems you or members of your family have had with the use of anesthetics.  Any blood disorders you have.  Previous surgeries you have had.  Medical conditions you have.  RISKS AND COMPLICATIONS Generally, this is a safe procedure. However, as with any procedure, problems can occur. Possible problems include:   Serious chest pain.  Rapid heartbeat.  Sensation of warmth in your chest. This usually passes quickly. BEFORE THE PROCEDURE Ask your health care provider about changing or stopping your regular medicines. PROCEDURE This procedure is usually done at a hospital and takes 2-4 hours.  An IV tube  is inserted into one of your veins.  Your health care provider will inject a small amount of radioactive tracer through the tube.  You will then wait for 20-40 minutes while the tracer travels through your bloodstream.  You will lie down on an exam table so images of your heart can be taken. Images will be taken for about 15-20 minutes.  You will exercise on a treadmill or stationary bike. While you exercise, your heart activity will be monitored with an electrocardiogram (ECG), and your blood pressure will be checked.  If you are unable to exercise, you may be given a medicine to make your heart beat faster.  When blood flow to your heart has peaked, tracer will again be injected through the IV tube.  After 20-40 minutes, you will get back on the exam table and have more images taken of your heart.  When the procedure is over, your IV tube will be removed. AFTER THE PROCEDURE  You will likely be able to leave shortly after the test. Unless your health care provider tells you otherwise, you may return to your normal schedule, including diet, activities, and medicines.  Make sure you find out how and when you will get your test results.   This information is not intended to replace advice given to you by your health care provider. Make sure you discuss any questions you have with your  health care provider.   Document Released: 06/15/2004 Document Revised: 05/26/2013 Document Reviewed: 04/29/2013 Elsevier Interactive Patient Education [employer].

## 2015-10-18 NOTE — Progress Notes (Signed)
Cardiology Office Note   Date:  10/18/2015   ID:  Karla Little, DOB 1933/07/16, MRN [id number]  Referring Doctor:  Golden Pop, MD   Cardiologist:   Wende Bushy, MD   Reason for consultation:  Chief Complaint  Patient presents with  . Establish Care    syncope, widened pulse pressure, per phone call from [facility]      History of Present Illness: Karla Little is a 82 y.o. female who presents for Episodes of feeling faint and rapid heart rate. Patient had an episode on the weekend where she felt her heart racing and did not feel well. She is not the best historian but she is described as feeling like she was in the days. She had to sit down when it happened. She felt faint but did not pass out. There was no loss of consciousness.  Another episode again happened today at kidney doctor's office. She wasn't feeling well, felt like she was in a daze, felt her heart pounding. She was thought to have a widened pulse pressure and vital signs and was sent to our office for further evaluation.  Patient does complain of chronic shortness of breath with exertion. Nonprogressive. Going on for a long time now. She denies chest pain with exertion. She denies chest pain with the episode she's been having.  Patient does drink almost 5 cups of coffee every day. Daughter says that she's not very good at drinking water. She just recently cut down on her caffeine intake. Her last meal for today was a bowl of oatmeal for breakfast. She did not have lunch. She may have had to classes of water throughout today.  Patient denies fever, cough, colds, abdominal pain. No PND, orthopnea, edema.  ROS:  Please see the history of present illness. Aside from mentioned under HPI, all other systems are reviewed and negative.     Past Medical History  Diagnosis Date  . Hyperlipidemia   . Osteoporosis   . Hypertension   . Vertigo   . Chronic kidney disease   . Hypothyroidism     Past  Surgical History  Procedure Laterality Date  . Breast biopsy    . Colonoscopy w/ polypectomy    . Colonoscopy with propofol N/A 06/02/2015    Procedure: COLONOSCOPY WITH PROPOFOL;  Surgeon: Manya Silvas, MD;  Location[facility];  Service: Endoscopy;  Laterality: N/A;     reports that she has never smoked. She has never used smokeless tobacco. She reports that she does not drink alcohol or use illicit drugs.   family history includes Cancer in her sister; Heart disease in her father; Hypertension in her mother.   Current Outpatient Prescriptions  Medication Sig Dispense Refill  . amLODipine (NORVASC) 10 MG tablet Take 1 tablet (10 mg total) by mouth daily. 90 tablet 4  . atorvastatin (LIPITOR) 40 MG tablet Take 1 tablet (40 mg total) by mouth daily. 90 tablet 4  . Calcium Citrate-Vitamin D (CALCIUM + D PO) Take by mouth daily.    . hydrALAZINE (APRESOLINE) 25 MG tablet Take 2 tablets (50 mg total) by mouth daily. 90 tablet 4  . levothyroxine (SYNTHROID, LEVOTHROID) 50 MCG tablet Take 1 tablet (50 mcg total) by mouth daily. 90 tablet 4  . lisinopril (PRINIVIL,ZESTRIL) 10 MG tablet Take 1 tablet (10 mg total) by mouth daily. 90 tablet 4  . nitroGLYCERIN (NITROSTAT) 0.4 MG SL tablet Place 0.4 mg under the tongue every 5 (five) minutes as needed  for chest pain.    . sodium bicarbonate 650 MG tablet     . vitamin B-12 (CYANOCOBALAMIN) 250 MCG tablet Take 250 mcg by mouth daily.    . Vitamin D, Ergocalciferol, (DRISDOL) 50000 UNITS CAPS capsule Take 50,000 Units by mouth every 7 (seven) days.    . vitamin E 200 UNIT capsule Take 200 Units by mouth daily.     No current facility-administered medications for this visit.    Allergies: Review of patient's allergies indicates no known allergies.    PHYSICAL EXAM: VS:  BP 140/60 mmHg  Pulse 90  Ht 5\' 9"  (1.753 m)  Wt 200 lb 6.4 oz (90.901 kg)  BMI 29.58 kg/m2  SpO2 97% , Body mass index is 29.58 kg/(m^2). Wt Readings from Last 3  Encounters:  10/18/15 200 lb 6.4 oz (90.901 kg)  06/02/15 190 lb (86.183 kg)  03/21/15 195 lb (88.451 kg)    Orthostatic VS for the past 24 hrs (Last 3 readings):  BP- Lying Pulse- Lying BP- Sitting Pulse- Sitting BP- Standing at 0 minutes Pulse- Standing at 0 minutes BP- Standing at 3 minutes Pulse- Standing at 3 minutes  10/18/15 1637 169/82 mmHg 79 151/78 mmHg 85 112/71 mmHg 92 147/78 mmHg 91    GENERAL:  well developed, well nourished, not in acute distress HEENT: normocephalic, pink conjunctivae, anicteric sclerae, no xanthelasma, normal dentition, oropharynx clear NECK:  no neck vein engorgement, JVP normal, no hepatojugular reflux, carotid upstroke brisk and symmetric, no bruit, no thyromegaly, no lymphadenopathy LUNGS:  good respiratory effort, clear to auscultation bilaterally CV:  PMI not displaced, no thrills, no lifts, S1 and S2 within normal limits, no palpable S3 or S4, no murmurs, no rubs, no gallops ABD:  Soft, nontender, nondistended, normoactive bowel sounds, no abdominal aortic bruit, no hepatomegaly, no splenomegaly MS: nontender back, no kyphosis, no scoliosis, no joint deformities EXT:  2+ DP/PT pulses, no edema, no varicosities, no cyanosis, no clubbing SKIN: warm, nondiaphoretic, normal turgor, no ulcers NEUROPSYCH: alert, oriented to person, place, and time, sensory/motor grossly intact, normal mood, appropriate affect  Recent Labs: 03/16/2015: TSH 2.150 03/21/2015: ALT 19; BUN 29*; Creatinine, Ser 1.55*; Hemoglobin 12.9; Platelets 203; Potassium 4.2; Sodium 137   Lipid Panel    Component Value Date/Time   CHOL 188 03/16/2015 1011   TRIG 131 03/16/2015 1011   HDL 77 03/16/2015 1011   CHOLHDL 2.4 03/16/2015 1011   LDLCALC 85 03/16/2015 1011     Other studies Reviewed:  EKG:  The ekg from 10/18/2015  was personally reviewed by me and it revealed sinus rhythm 86 BPM  right axis deviation.  Additional studies/ records that were reviewed personally reviewed  by me today include:None available  ASSESSMENT AND PLAN:  Episodes of feeling faint and heart pounding/palpitations She is significantly orthostatic on today's vital signs. This may very well explain her symptoms. Discussed at length the importance of cutting down caffeine intake to maximal one cup of coffee a day. Recommend increasing hydration with water. Also recommend to eat on time. If symptoms persist despite these changes, recommend Holter monitor.  Shortness of breath Risk factors for CAD include age, postmenopausal state, hypertension, hyperlipidemia. Recommend exercise nuclear stress test.  Hypertension Continue to monitor blood pressure. Addressed possible dehydration leading to orthostatic changes. Also recommend slowly change from different positions lying down to sitting up to standing up.    Current medicines are reviewed at length with the patient today.  The patient does not have concerns regarding medicines.  Labs/ tests ordered today include:  Orders Placed This Encounter  Procedures  . NM Myocar Multi W/Spect W/Wall Motion / EF  . EKG 12-Lead  . ECHOCARDIOGRAM COMPLETE    I had a lengthy and detailed discussion with the patient regarding diagnoses, prognosis, diagnostic options, treatment options .   I counseled the patient on importance of lifestyle modification including heart healthy diet, regular physical activity.   Disposition:   FU with undersigned after tests   I spent at least 60 minutes with the patient today and more than 50% of the time was spent counseling the patient and coordinating care.   Signed, Wende Bushy, MD  10/18/2015 4:44 PM    [facility]

## 2015-11-02 ENCOUNTER — Encounter
Admission: RE | Admit: 2015-11-02 | Discharge: 2015-11-02 | Disposition: A | Discharge status: Home / Self Care | Payer: Medicare Other | Source: Ambulatory Visit | Attending: Cardiology | Admitting: Cardiology

## 2015-11-02 DIAGNOSIS — R0602 Shortness of breath: Secondary | ICD-10-CM | POA: Diagnosis not present

## 2015-11-02 DIAGNOSIS — R002 Palpitations: Secondary | ICD-10-CM | POA: Diagnosis not present

## 2015-11-02 LAB — NM MYOCAR MULTI W/SPECT W/WALL MOTION / EF
CSEPHR: 76 %
CSEPPHR: 107 {beats}/min
LVDIAVOL: 79 mL (ref 46–106)
LVSYSVOL: 20 mL
Rest HR: 83 {beats}/min
SDS: 1
SRS: 0
SSS: 1
TID: 1.3

## 2015-11-02 MED ORDER — REGADENOSON 0.4 MG/5ML IV SOLN
0.4000 mg | Freq: Once | INTRAVENOUS | Status: AC
  Administered 2015-11-02: 0.4 mg via INTRAVENOUS

## 2015-11-02 MED ORDER — TECHNETIUM TC 99M TETROFOSMIN IV KIT
30.0000 | PACK | Freq: Once | INTRAVENOUS | Status: AC | PRN
  Administered 2015-11-02: 32.612 via INTRAVENOUS

## 2015-11-02 MED ORDER — TECHNETIUM TC 99M TETROFOSMIN IV KIT: 13.0000 | PACK | Freq: Once | INTRAVENOUS | Status: DC | PRN

## 2015-11-02 MED ORDER — TECHNETIUM TC 99M TETROFOSMIN IV KIT
13.8820 | PACK | Freq: Once | INTRAVENOUS | Status: AC | PRN
  Administered 2015-11-02: 13.882 via INTRAVENOUS

## 2015-11-03 ENCOUNTER — Ambulatory Visit (INDEPENDENT_AMBULATORY_CARE_PROVIDER_SITE_OTHER): Payer: Medicare Other

## 2015-11-03 ENCOUNTER — Other Ambulatory Visit: Payer: Self-pay

## 2015-11-03 DIAGNOSIS — N2581 Secondary hyperparathyroidism of renal origin: Secondary | ICD-10-CM | POA: Diagnosis not present

## 2015-11-03 DIAGNOSIS — E872 Acidosis: Secondary | ICD-10-CM | POA: Diagnosis not present

## 2015-11-03 DIAGNOSIS — R002 Palpitations: Secondary | ICD-10-CM | POA: Diagnosis not present

## 2015-11-03 DIAGNOSIS — R0602 Shortness of breath: Secondary | ICD-10-CM

## 2015-11-03 DIAGNOSIS — N184 Chronic kidney disease, stage 4 (severe): Secondary | ICD-10-CM | POA: Diagnosis not present

## 2015-11-03 DIAGNOSIS — I10 Essential (primary) hypertension: Secondary | ICD-10-CM | POA: Diagnosis not present

## 2015-11-04 DIAGNOSIS — I10 Essential (primary) hypertension: Secondary | ICD-10-CM | POA: Diagnosis not present

## 2015-11-04 DIAGNOSIS — E872 Acidosis: Secondary | ICD-10-CM | POA: Diagnosis not present

## 2015-11-04 DIAGNOSIS — N183 Chronic kidney disease, stage 3 (moderate): Secondary | ICD-10-CM | POA: Diagnosis not present

## 2015-11-14 ENCOUNTER — Ambulatory Visit (INDEPENDENT_AMBULATORY_CARE_PROVIDER_SITE_OTHER): Payer: Medicare Other | Admitting: Cardiology

## 2015-11-14 ENCOUNTER — Encounter: Payer: Self-pay | Admitting: Cardiology

## 2015-11-14 VITALS — BP 170/64 | HR 83 | Ht 69.0 in | Wt 197.5 lb

## 2015-11-14 DIAGNOSIS — I10 Essential (primary) hypertension: Secondary | ICD-10-CM

## 2015-11-14 DIAGNOSIS — N2581 Secondary hyperparathyroidism of renal origin: Secondary | ICD-10-CM | POA: Diagnosis not present

## 2015-11-14 DIAGNOSIS — I951 Orthostatic hypotension: Secondary | ICD-10-CM

## 2015-11-14 DIAGNOSIS — R0602 Shortness of breath: Secondary | ICD-10-CM | POA: Diagnosis not present

## 2015-11-14 DIAGNOSIS — N184 Chronic kidney disease, stage 4 (severe): Secondary | ICD-10-CM | POA: Diagnosis not present

## 2015-11-14 DIAGNOSIS — E872 Acidosis: Secondary | ICD-10-CM | POA: Diagnosis not present

## 2015-11-14 NOTE — Patient Instructions (Signed)
Medication Instructions:  Your physician recommends that you continue on your current medications as directed. Please refer to the Current Medication list given to you today.  Labwork: None ordered.  Testing/Procedures: None ordered.  Follow-Up: Your physician recommends that you schedule a follow-up appointment as needed.   Any Other Special Instructions Will Be Listed Below (If Applicable).     If you need a refill on your cardiac medications before your next appointment, please call your pharmacy.   

## 2015-11-14 NOTE — Progress Notes (Signed)
Cardiology Office Note   Date:  11/14/2015   ID:  Little Karla, DOB 11-13-1933, MRN [id number]  Referring Doctor:  Golden Pop, MD   Cardiologist:   Wende Bushy, MD   Reason for consultation:  Chief Complaint  Patient presents with  . other    F/u Nuc med and echo. Meds reviewed verbally.      History of Present Illness: Karla Little is a 82 y.o. female who presents for Follow-up after tests  Patient has had no recurrence of lightheadedness or passing out since last visit. She has weaned herself off of caffeine and only takes T cath coffee. She has been increasing her fluid or water intake probably have overdone it by drinking almost a gallon a day.  Patient does complain of chronic shortness of breath with exertion. Nonprogressive. Going on for a long time now.  She denies chest pain with exertion.   Patient denies fever, cough, colds, abdominal pain. No PND, orthopnea, edema.  ROS:  Please see the history of present illness. Aside from mentioned under HPI, all other systems are reviewed and negative.     Past Medical History  Diagnosis Date  . Hyperlipidemia   . Osteoporosis   . Hypertension   . Vertigo   . Chronic kidney disease   . Hypothyroidism     Past Surgical History  Procedure Laterality Date  . Breast biopsy    . Colonoscopy w/ polypectomy    . Colonoscopy with propofol N/A 06/02/2015    Procedure: COLONOSCOPY WITH PROPOFOL;  Surgeon: Manya Silvas, MD;  Location: [facility] ENDOSCOPY;  Service: Endoscopy;  Laterality: N/A;     reports that she has never smoked. She has never used smokeless tobacco. She reports that she does not drink alcohol or use illicit drugs.   family history includes Cancer in her sister; Heart disease in her father; Hypertension in her mother.   Current Outpatient Prescriptions  Medication Sig Dispense Refill  . amLODipine (NORVASC) 10 MG tablet Take 1 tablet (10 mg total) by mouth daily. 90 tablet 4  .  atorvastatin (LIPITOR) 40 MG tablet Take 1 tablet (40 mg total) by mouth daily. 90 tablet 4  . Calcium Citrate-Vitamin D (CALCIUM + D PO) Take by mouth daily.    . hydrALAZINE (APRESOLINE) 25 MG tablet Take 2 tablets (50 mg total) by mouth daily. 90 tablet 4  . levothyroxine (SYNTHROID, LEVOTHROID) 50 MCG tablet Take 1 tablet (50 mcg total) by mouth daily. 90 tablet 4  . lisinopril (PRINIVIL,ZESTRIL) 10 MG tablet Take 1 tablet (10 mg total) by mouth daily. 90 tablet 4  . nitroGLYCERIN (NITROSTAT) 0.4 MG SL tablet Place 0.4 mg under the tongue every 5 (five) minutes as needed for chest pain.    . sodium bicarbonate 650 MG tablet     . vitamin B-12 (CYANOCOBALAMIN) 250 MCG tablet Take 250 mcg by mouth daily.    . Vitamin D, Ergocalciferol, (DRISDOL) 50000 UNITS CAPS capsule Take 50,000 Units by mouth daily.     . vitamin E 200 UNIT capsule Take 200 Units by mouth daily.     No current facility-administered medications for this visit.    Allergies: Review of patient's allergies indicates no known allergies.    PHYSICAL EXAM: VS:  BP 170/64 mmHg  Pulse 83  Ht 5\' 9"  (1.753 m)  Wt 197 lb 8 oz (89.585 kg)  BMI 29.15 kg/m2 , Body mass index is 29.15 kg/(m^2). Wt Readings from Last 3 Encounters:  11/14/15 197 lb 8 oz (89.585 kg)  10/18/15 200 lb 6.4 oz (90.901 kg)  06/02/15 190 lb (86.183 kg)    GENERAL:  well developed, well nourished, not in acute distress HEENT: normocephalic, pink conjunctivae, anicteric sclerae, no xanthelasma, normal dentition, oropharynx clear NECK:  no neck vein engorgement, JVP normal, no hepatojugular reflux, carotid upstroke brisk and symmetric, no bruit, no thyromegaly, no lymphadenopathy LUNGS:  good respiratory effort, clear to auscultation bilaterally CV:  PMI not displaced, no thrills, no lifts, S1 and S2 within normal limits, no palpable S3 or S4, no murmurs, no rubs, no gallops ABD:  Soft, nontender, nondistended, normoactive bowel sounds, no abdominal  aortic bruit, no hepatomegaly, no splenomegaly MS: nontender back, no kyphosis, no scoliosis, no joint deformities EXT:  2+ DP/PT pulses, no edema, no varicosities, no cyanosis, no clubbing SKIN: warm, nondiaphoretic, normal turgor, no ulcers NEUROPSYCH: alert, oriented to person, place, and time, sensory/motor grossly intact, normal mood, appropriate affect  Recent Labs: 03/16/2015: TSH 2.150 03/21/2015: ALT 19; BUN 29*; Creatinine, Ser 1.55*; Hemoglobin 12.9; Platelets 203; Potassium 4.2; Sodium 137   Lipid Panel    Component Value Date/Time   CHOL 188 03/16/2015 1011   TRIG 131 03/16/2015 1011   HDL 77 03/16/2015 1011   CHOLHDL 2.4 03/16/2015 1011   LDLCALC 85 03/16/2015 1011     Other studies Reviewed:  EKG:  The ekg from 10/18/2015  was personally reviewed by me and it revealed sinus rhythm 86 BPM  right axis deviation.  Additional studies/ records that were reviewed personally reviewed by me today include: Echo 11/03/2015: Left ventricle: The cavity size was normal. Systolic function was  normal. The estimated ejection fraction was in the range of 60%  to 65%. Wall motion was normal; there were no regional wall  motion abnormalities. Left ventricular diastolic function  parameters were normal. - Mitral valve: There was mild regurgitation. - Left atrium: The atrium was normal in size. - Right ventricle: Systolic function was normal. - Pulmonary arteries: Systolic pressure was within the normal  range.  Stress test 11/02/2015: Pharmacological myocardial perfusion imaging study with no significant ischemia Normal wall motion, EF estimated at 86% No EKG changes concerning for ischemia at peak stress or in recovery. Low risk scan   ASSESSMENT AND PLAN:  Episodes of feeling faint and heart pounding/palpitations She Was significantly orthostatic on initial visit.  She has had no recurrence so far. She has weaned of caffeine and hydrated herself.   Shortness of  breath Risk factors for CAD include age, postmenopausal state, hypertension, hyperlipidemia.Stress test revealed no ischemia. Findings discussed with patient. This pertains a lower likelihood of clinically significant CAD. Patient reassured. Risk factor modification recommended.   Hypertension Patient has not been checking her blood pressure at home. Continue to monitor blood pressure. Goal is less than 140/80. No headaches, chest pain. No shortness of breath currently. Continue medical therapy. Follow up with PCP.  Current medicines are reviewed at length with the patient today.  The patient does not have concerns regarding medicines.  Labs/ tests ordered today include:  No orders of the defined types were placed in this encounter.    I had a lengthy and detailed discussion with the patient regarding diagnoses, prognosis, diagnostic options, treatment options .   I counseled the patient on importance of lifestyle modification including heart healthy diet, regular physical activity.   Disposition:   FU with undersignedwhen necessary    Signed, Wende Bushy, MD  11/14/2015 11:03 AM  [facility]

## 2015-11-21 ENCOUNTER — Encounter: Payer: Self-pay | Admitting: *Deleted

## 2015-11-21 ENCOUNTER — Ambulatory Visit
Admission: EM | Admit: 2015-11-21 | Discharge: 2015-11-21 | Disposition: A | Discharge status: Home / Self Care | Payer: Medicare Other | Attending: Family Medicine | Admitting: Family Medicine

## 2015-11-21 DIAGNOSIS — M7632 Iliotibial band syndrome, left leg: Secondary | ICD-10-CM

## 2015-11-21 DIAGNOSIS — I10 Essential (primary) hypertension: Secondary | ICD-10-CM

## 2015-11-21 DIAGNOSIS — M79605 Pain in left leg: Secondary | ICD-10-CM

## 2015-11-21 NOTE — ED Provider Notes (Signed)
CSN: [id number]     Arrival date & time 11/21/15  1118 History   First MD Initiated Contact with Patient 11/21/15 1156     Chief Complaint  Patient presents with  . Leg Pain   (Consider location/radiation/quality/duration/timing/severity/associated sxs/prior Treatment) HPI Comments: 82 yo female with a complaint of 2 weeks h/o left lower extremity (shin) pain and "several months" history of left upper leg (thigh) pain. Denies any falls, injuries, trauma, rash, fevers, chills.   States she's a little bit anxious this morning and concerned about her blood pressure (patient has a PCP and is being treated for hypertension). Denies chest pain or shortness of breath.   The history is provided by the patient.    Past Medical History  Diagnosis Date  . Hyperlipidemia   . Osteoporosis   . Hypertension   . Vertigo   . Chronic kidney disease   . Hypothyroidism    Past Surgical History  Procedure Laterality Date  . Breast biopsy    . Colonoscopy w/ polypectomy    . Colonoscopy with propofol N/A 06/02/2015    Procedure: COLONOSCOPY WITH PROPOFOL;  Surgeon: Manya Silvas, MD;  Location: [facility] ENDOSCOPY;  Service: Endoscopy;  Laterality: N/A;   Family History  Problem Relation Age of Onset  . Hypertension Mother   . Heart disease Father   . Cancer Sister     breast   Social History  Substance Use Topics  . Smoking status: Never Smoker   . Smokeless tobacco: Never Used  . Alcohol Use: No   OB History    No data available     Review of Systems  Allergies  Review of patient's allergies indicates no known allergies.  Home Medications   Prior to Admission medications   Medication Sig Start Date End Date Taking? Authorizing Provider  amLODipine (NORVASC) 10 MG tablet Take 1 tablet (10 mg total) by mouth daily. 03/16/15  Yes [name], MD  atorvastatin (LIPITOR) 40 MG tablet Take 1 tablet (40 mg total) by mouth daily. 03/16/15  Yes [name], MD  Calcium  Citrate-Vitamin D (CALCIUM + D PO) Take by mouth daily.   Yes Historical Provider, MD  hydrALAZINE (APRESOLINE) 25 MG tablet Take 2 tablets (50 mg total) by mouth daily. 03/16/15  Yes [name], MD  levothyroxine (SYNTHROID, LEVOTHROID) 50 MCG tablet Take 1 tablet (50 mcg total) by mouth daily. 03/16/15  Yes [name], MD  lisinopril (PRINIVIL,ZESTRIL) 10 MG tablet Take 1 tablet (10 mg total) by mouth daily. 03/16/15  Yes [name], MD  vitamin B-12 (CYANOCOBALAMIN) 250 MCG tablet Take 250 mcg by mouth daily.   Yes Historical Provider, MD  Vitamin D, Ergocalciferol, (DRISDOL) 50000 UNITS CAPS capsule Take 50,000 Units by mouth daily.    Yes Historical Provider, MD  vitamin E 200 UNIT capsule Take 200 Units by mouth daily.   Yes Historical Provider, MD  nitroGLYCERIN (NITROSTAT) 0.4 MG SL tablet Place 0.4 mg under the tongue every 5 (five) minutes as needed for chest pain.    Historical Provider, MD  sodium bicarbonate 650 MG tablet  02/25/15   Historical Provider, MD   Meds Ordered and Administered this Visit  Medications - No data to display  BP 142/68 mmHg  Pulse 85  Temp(Src) 98.4 F (36.9 C) (Oral)  Resp 20  Ht 5\' 9"  (1.753 m)  Wt 200 lb (90.719 kg)  BMI 29.52 kg/m2  SpO2 99% No data found.   Physical Exam  Constitutional: She appears well-developed and well-nourished. No distress.  Cardiovascular: Normal rate, regular rhythm, normal heart sounds and intact distal pulses.   No murmur heard. Pulmonary/Chest: Effort normal and breath sounds normal. No respiratory distress. She has no wheezes. She has no rales.  Musculoskeletal: She exhibits tenderness. She exhibits no edema.  Tenderness along the mid to distal left IT band; no bony tenderness; extremity neurovascularly intact  Skin: She is not diaphoretic.  Nursing note and vitals reviewed.   ED Course  Procedures (including critical care time)  Labs Review Labs Reviewed - No data to display  Imaging  Review No results found.   Visual Acuity Review  Right Eye Distance:   Left Eye Distance:   Bilateral Distance:    Right Eye Near:   Left Eye Near:    Bilateral Near:      Recheck bp: 142/68 (prior to D/C)  MDM   1. Pain of left lower extremity   2. IT band syndrome, left   3. Essential hypertension    Discharge Medication List as of 11/21/2015 12:23 PM     1. diagnosis reviewed with patient 2. Recommend supportive treatment with otc tylenol prn, stretching, heat/ice prn  3. Follow-up prn if symptoms worsen or don't improve    Norval Gable, MD 11/21/15 1411

## 2015-11-21 NOTE — ED Notes (Signed)
Left lower leg pain x "several months", denies injury. Also concerned about her BP.

## 2015-11-30 ENCOUNTER — Encounter: Payer: Self-pay | Admitting: Family Medicine

## 2015-11-30 ENCOUNTER — Ambulatory Visit (INDEPENDENT_AMBULATORY_CARE_PROVIDER_SITE_OTHER): Payer: Medicare Other | Admitting: Family Medicine

## 2015-11-30 VITALS — BP 152/74 | HR 79 | Temp 98.6°F | Wt 199.0 lb

## 2015-11-30 DIAGNOSIS — M79605 Pain in left leg: Secondary | ICD-10-CM

## 2015-11-30 MED ORDER — PREDNISONE 20 MG PO TABS: 40.0000 mg | ORAL_TABLET | Freq: Every day | ORAL | Status: DC

## 2015-11-30 MED ORDER — CYCLOBENZAPRINE HCL 5 MG PO TABS: 5.0000 mg | ORAL_TABLET | Freq: Three times a day (TID) | ORAL | Status: DC | PRN

## 2015-11-30 NOTE — Progress Notes (Addendum)
BP 152/74 mmHg  Pulse 79  Temp(Src) 98.6 F (37 C)  Wt 199 lb (90.266 kg)  SpO2 97%   Subjective:    Patient ID: Karla Little, female    DOB: 11/24/1933, 82 y.o.   MRN: [id number]  HPI: Karla Little is a 82 y.o. female  Chief Complaint  Patient presents with  . Leg Pain    left leg pain for approx 2-3 months then a few weeks ago it started moving up her leg and into her other leg. It is painful, keeping her up at night. No known injury. She can't take any NSAID's due to kidney disease, so has only been taking Tylenol. She did find one dose of Aleve and took that today and it helped. She has also tried heating pad and stretches. She went to UC at [facility] and they told her it might be a muscle issue.   Patient c/o left lower extremity pain that has persisted for 3-6 weeks now and seems to be worsening over time. Describes the pain as a dull, deep aching pain that seems to be generalized down the lateral left leg. Denies burning pain, numbness, or radiation down leg. Denies back pain, bowel/bladder dysfunction, fever, chills, or trauma. Has been taking tylenol and using heat as well as seeing a Chiropractor, no relief from these things. Saw UC recently and was told it was likely IT band syndrome and given stretches to help. X-ray performed at UC negative. Pain is starting to inhibit daily activities.   Relevant past medical, surgical, family and social history reviewed and updated as indicated. Interim medical history since our last visit reviewed. Allergies and medications reviewed and updated.  Review of Systems  Constitutional: Negative for fever and chills.  Respiratory: Negative.   Cardiovascular: Negative.   Genitourinary: Negative.   Musculoskeletal: Positive for myalgias, arthralgias and gait problem. Negative for back pain and joint swelling.  Neurological: Negative for weakness and numbness.  Psychiatric/Behavioral: Negative.     Per HPI unless specifically indicated  above     Objective:    BP 152/74 mmHg  Pulse 79  Temp(Src) 98.6 F (37 C)  Wt 199 lb (90.266 kg)  SpO2 97%  Wt Readings from Last 3 Encounters:  11/30/15 199 lb (90.266 kg)  11/21/15 200 lb (90.719 kg)  11/14/15 197 lb 8 oz (89.585 kg)    Physical Exam  Constitutional: She is oriented to person, place, and time. She appears well-developed and well-nourished. No distress.  Eyes: Conjunctivae are normal. No scleral icterus.  Neck: Neck supple.  Cardiovascular: Normal heart sounds.   Pulmonary/Chest: Effort normal. No respiratory distress.  Musculoskeletal: Normal range of motion. She exhibits tenderness (over left piriformis and IT band). She exhibits no edema.  Neurological: She is alert and oriented to person, place, and time.  Skin: Skin is warm and dry. No rash noted.  Psychiatric: She has a normal mood and affect. Her behavior is normal.    Results for orders placed or performed during the hospital encounter of 11/02/15  NM Myocar Multi W/Spect W/Wall Motion / EF  Result Value Ref Range   Rest HR 83 bpm   Rest BP 170/59 mmHg   Exercise duration (min)  min   Exercise duration (sec)  sec   Estimated workload  METS   Peak HR 107 bpm   Peak BP 146/53 mmHg   MPHR  bpm   Percent HR 76 %   RPE     LV sys  vol 20 mL   TID 1.30    LV dias vol 79 46 - 106 mL   LHR     SSS 1    SRS 0    SDS 1       Assessment & Plan:   Problem List Items Addressed This Visit    None    Visit Diagnoses    Pain of left lower extremity    -  Primary    Likely muscular, patient has failed conservative management with stretches, tylenol, and chiropractic care. Flexeril and prednisone given     Discussed with patient the option of referral to PT or orthopedics, states she is not ready to go to this step yet. Agreeable to trying the next step up in medications at this time. Thorough discussion was had about risks and cautions of these medications. Patient aware not to drive while taking  Flexeril, and that this medication can increase fall risk. Recommended patient try swimming or yoga in addition to continuing conservative measures. Patient to follow up if issues not resolving in the next week or so.    Follow up plan: Return if symptoms worsen or fail to improve.

## 2015-11-30 NOTE — Patient Instructions (Signed)
Iliotibial Band Syndrome With Rehab The iliotibial (IT) band is a tendon that connects the hip muscles to the shinbone (tibia) and to one of the bones of the pelvis (ileum). The IT band passes by the knee and is often irritated by the outer portion of the knee (lateral femoral condyle). A fluid filled sac (bursa) exists between the tendon and the bone, to cushion and reduce friction. Overuse of the tendon may cause excessive friction, which results in IT band syndrome. This condition involves inflammation of the bursa (bursitis) and/or inflammation of the IT band (tendinitis). SYMPTOMS   Pain, tenderness, swelling, warmth, or redness over the IT band, at the outer knee (above the joint).  Pain that travels up or down the thigh or leg.  Initially, pain at the beginning of an exercise, that decreases once warmed up. Eventually, pain throughout the activity, getting worse as the activity continues. May cause the athlete to stop in the middle of training or competing.  Pain that gets worse when running down hills or stairs, on banked tracks, or next to the curb on the street.  Pain that increases when the foot of the affected leg hits the ground.  Possibly, a crackling sound (crepitation) when the tendon or bursa is moved or touched. CAUSES  IT band syndrome is caused by irritation of the IT band and the underlying bursa. This eventually results in inflammation and pain. IT band syndrome is an overuse injury.  RISK INCREASES WITH:  Sports with repetitive knee-bending activities (distance running, cycling).  Incorrect training techniques, including sudden changes in the intensity, frequency, or duration of training.  Not enough rest between workouts.  Poor strength and flexibility, especially a tight IT band.  Failure to warm up properly before activity.  Bow legs.  Arthritis of the knee. PREVENTION   Warm up and stretch properly before activity.  Allow for adequate recovery between  workouts.  Maintain physical fitness:  Strength, flexibility, and endurance.  Cardiovascular fitness.  Learn and use proper training technique, including reducing running mileage, shortening stride, and avoiding running on hills and banked surfaces.  Wear arch supports (orthotics), if you have flat feet. PROGNOSIS  If treated properly, IT band syndrome usually goes away within 6 weeks of treatment. RELATED COMPLICATIONS   Longer healing time, if not properly treated, or if not given enough time to heal.  Recurring inflammation of the tendon and bursa, that may result in a chronic condition.  Recurring symptoms, if activity is resumed too soon, with overuse, with a direct blow, or with poor training technique.  Inability to complete training or competition. TREATMENT  Treatment first involves the use of ice and medicine, to reduce pain and inflammation. The use of strengthening and stretching exercises may help reduce pain with activity. These exercises may be performed at home or with a therapist. For individuals with flat feet, an arch support (orthotic) may be helpful. Some individuals find that wearing a knee sleeve or compression bandage around the knee during workouts provides some relief. Certain training techniques, such as adjusting stride length, avoiding running on hills or stairs, changing the direction you run on a circular or banked track, or changing the side of the road you run on, if you run next to the curb, may help decrease symptoms of IT band syndrome. Cyclists may need to change the seat height or foot position on their bicycles. An injection of cortisone into the bursa may be recommended. Surgery to remove the inflamed bursa and/or part   of the IT band is only considered after at least 6 months of non-surgical treatment.  MEDICATION   If pain medicine is needed, nonsteroidal anti-inflammatory medicines (aspirin and ibuprofen), or other minor pain relievers  (acetaminophen), are often advised.  Do not take pain medicine for 7 days before surgery.  Prescription pain relievers may be given, if your caregiver thinks they are needed. Use only as directed and only as much as you need.  Corticosteroid injections may be given by your caregiver. These injections should be reserved for the most serious cases, because they may only be given a certain number of times. HEAT AND COLD  Cold treatment (icing) should be applied for 10 to 15 minutes every 2 to 3 hours for inflammation and pain, and immediately after activity that aggravates your symptoms. Use ice packs or an ice massage.  Heat treatment may be used before performing stretching and strengthening activities prescribed by your caregiver, physical therapist, or athletic trainer. Use a heat pack or a warm water soak. SEEK MEDICAL CARE IF:   Symptoms get worse or do not improve in 2 to 4 weeks, despite treatment.  New, unexplained symptoms develop. (Drugs used in treatment may produce side effects.) EXERCISES  RANGE OF MOTION (ROM) AND STRETCHING EXERCISES - Iliotibial Band Syndrome These exercises may help you when beginning to rehabilitate your injury. Your symptoms may go away with or without further involvement from your physician, physical therapist or athletic trainer. While completing these exercises, remember:   Restoring tissue flexibility helps normal motion to return to the joints. This allows healthier, less painful movement and activity.  An effective stretch should be held for at least 30 seconds.  A stretch should never be painful. You should only feel a gentle lengthening or release in the stretched tissue. STRETCH - Quadriceps, Prone   Lie on your stomach on a firm surface, such as a bed or padded floor.  Bend your right / left knee and grasp your ankle. If you are unable to reach your ankle or pant leg, use a belt around your foot to lengthen your reach.  Gently pull your heel  toward your buttocks. Your knee should not slide out to the side. You should feel a stretch in the front of your thigh and knee.  Hold this position for __________ seconds. Repeat __________ times. Complete this stretch __________ times per day.  STRETCH - Iliotibial Band  On the floor or bed, lie on your side, so your right / left leg is on top. Bend your knee and grab your ankle.  Slowly bring your knee back so that your thigh is in line with your trunk. Keep your heel at your buttocks and gently arch your back, so your head, shoulders and hips line up.  Slowly lower your leg so that your knee approaches the floor or bed, until you feel a gentle stretch on the outside of your right / left thigh. If you do not feel a stretch and your knee will not fall farther, place the heel of your opposite foot on top of your knee, and pull your thigh down farther.  Hold this stretch for __________ seconds. Repeat __________ times. Complete this stretch __________ times per day. STRENGTHENING EXERCISES - Iliotibial Band Syndrome Improving the flexibility of the IT band will best relieve your discomfort due to IT band syndrome. Strengthening exercises, however, can help improve both muscle endurance and joint mechanics, reducing the factors that can contribute to this condition. Your physician, physical   therapist or athletic trainer may provide you with exercises that train specific muscle groups that are especially weak. The following exercises target muscles that are often weak in people who have IT band syndrome. STRENGTH - Hip Abductors, Straight Leg Raises  Be aware of your form throughout the entire exercise, so that you exercise the correct muscles. Poor form means that you are not strengthening the correct muscles.  Lie on your side, so that your head, shoulders, knee and hip line up. You may bend your lower knee to help maintain your balance. Your right / left leg should be on top.  Roll your hips  slightly forward, so that your hips are stacked directly over each other and your right / left knee is facing forward.  Lift your top leg up 4-6 inches, leading with your heel. Be sure that your foot does not drift forward and that your knee does not roll toward the ceiling.  Hold this position for __________ seconds. You should feel the muscles in your outer hip lifting (you may not notice this until your leg begins to tire).  Slowly lower your leg to the starting position. Allow the muscles to fully relax before beginning the next repetition. Repeat __________ times. Complete this exercise __________ times per day.  STRENGTH - Quad/VMO, Isometric  Sit in a chair with your right / left knee slightly bent. With your fingertips, feel the VMO muscle (just above the inside of your knee). The VMO is important in controlling the position of your kneecap.  Keeping your fingertips on this muscle. Without actually moving your leg, attempt to drive your knee down, as if straightening your leg. You should feel your VMO tense. If you have a difficult time, you may wish to try the same exercise on your healthy knee first.  Tense this muscle as hard as you can, without increasing any knee pain.  Hold for __________ seconds. Relax the muscles slowly and completely between each repetition. Repeat __________ times. Complete this exercise __________ times per day.    This information is not intended to replace advice given to you by your health care provider. Make sure you discuss any questions you have with your health care provider.   Document Released: 05/21/2005 Document Revised: 06/11/2014 Document Reviewed: 09/02/2008 Elsevier Interactive Patient Education 2016 [employer]  

## 2015-12-30 ENCOUNTER — Other Ambulatory Visit: Payer: Self-pay | Admitting: Family Medicine

## 2015-12-30 NOTE — Telephone Encounter (Signed)
Routing to provider  

## 2016-01-06 DIAGNOSIS — M5442 Lumbago with sciatica, left side: Secondary | ICD-10-CM | POA: Diagnosis not present

## 2016-01-06 DIAGNOSIS — M9902 Segmental and somatic dysfunction of thoracic region: Secondary | ICD-10-CM | POA: Diagnosis not present

## 2016-01-06 DIAGNOSIS — M9904 Segmental and somatic dysfunction of sacral region: Secondary | ICD-10-CM | POA: Diagnosis not present

## 2016-01-06 DIAGNOSIS — M5441 Lumbago with sciatica, right side: Secondary | ICD-10-CM | POA: Diagnosis not present

## 2016-01-06 DIAGNOSIS — M9903 Segmental and somatic dysfunction of lumbar region: Secondary | ICD-10-CM | POA: Diagnosis not present

## 2016-01-12 DIAGNOSIS — M5442 Lumbago with sciatica, left side: Secondary | ICD-10-CM | POA: Diagnosis not present

## 2016-01-12 DIAGNOSIS — M9902 Segmental and somatic dysfunction of thoracic region: Secondary | ICD-10-CM | POA: Diagnosis not present

## 2016-01-12 DIAGNOSIS — M5441 Lumbago with sciatica, right side: Secondary | ICD-10-CM | POA: Diagnosis not present

## 2016-01-12 DIAGNOSIS — M9904 Segmental and somatic dysfunction of sacral region: Secondary | ICD-10-CM | POA: Diagnosis not present

## 2016-01-12 DIAGNOSIS — M9903 Segmental and somatic dysfunction of lumbar region: Secondary | ICD-10-CM | POA: Diagnosis not present

## 2016-01-17 DIAGNOSIS — M5441 Lumbago with sciatica, right side: Secondary | ICD-10-CM | POA: Diagnosis not present

## 2016-01-17 DIAGNOSIS — M9904 Segmental and somatic dysfunction of sacral region: Secondary | ICD-10-CM | POA: Diagnosis not present

## 2016-01-17 DIAGNOSIS — M5442 Lumbago with sciatica, left side: Secondary | ICD-10-CM | POA: Diagnosis not present

## 2016-01-17 DIAGNOSIS — M9903 Segmental and somatic dysfunction of lumbar region: Secondary | ICD-10-CM | POA: Diagnosis not present

## 2016-01-17 DIAGNOSIS — M9902 Segmental and somatic dysfunction of thoracic region: Secondary | ICD-10-CM | POA: Diagnosis not present

## 2016-01-23 DIAGNOSIS — M9903 Segmental and somatic dysfunction of lumbar region: Secondary | ICD-10-CM | POA: Diagnosis not present

## 2016-01-23 DIAGNOSIS — M9902 Segmental and somatic dysfunction of thoracic region: Secondary | ICD-10-CM | POA: Diagnosis not present

## 2016-01-23 DIAGNOSIS — M9907 Segmental and somatic dysfunction of upper extremity: Secondary | ICD-10-CM | POA: Diagnosis not present

## 2016-01-23 DIAGNOSIS — M9904 Segmental and somatic dysfunction of sacral region: Secondary | ICD-10-CM | POA: Diagnosis not present

## 2016-01-23 DIAGNOSIS — M5441 Lumbago with sciatica, right side: Secondary | ICD-10-CM | POA: Diagnosis not present

## 2016-01-23 DIAGNOSIS — M5442 Lumbago with sciatica, left side: Secondary | ICD-10-CM | POA: Diagnosis not present

## 2016-01-24 ENCOUNTER — Encounter: Payer: Self-pay | Admitting: Family Medicine

## 2016-01-24 ENCOUNTER — Ambulatory Visit (INDEPENDENT_AMBULATORY_CARE_PROVIDER_SITE_OTHER): Payer: Medicare Other | Admitting: Family Medicine

## 2016-01-24 DIAGNOSIS — I10 Essential (primary) hypertension: Secondary | ICD-10-CM

## 2016-01-24 DIAGNOSIS — E785 Hyperlipidemia, unspecified: Secondary | ICD-10-CM

## 2016-01-24 DIAGNOSIS — M791 Myalgia, unspecified site: Secondary | ICD-10-CM | POA: Insufficient documentation

## 2016-01-24 DIAGNOSIS — N184 Chronic kidney disease, stage 4 (severe): Secondary | ICD-10-CM | POA: Diagnosis not present

## 2016-01-24 NOTE — Assessment & Plan Note (Signed)
Discuss leg soreness and arm soreness may be related to Lipitor will stop Lipitor and observe symptoms

## 2016-01-24 NOTE — Progress Notes (Signed)
BP 129/72 (BP Location: Left Arm, Patient Position: Sitting, Cuff Size: Normal)   Pulse 81   Temp 97.9 F (36.6 C)   Ht 5\' 8"  (1.727 m)   Wt 198 lb (89.8 kg)   SpO2 95%   BMI 30.11 kg/m    Subjective:    Patient ID: Karla Little, female    DOB: 1933/10/27, 82 y.o.   MRN: [id number]  HPI: Karla Little is a 82 y.o. female  Chief Complaint  Patient presents with  . muscles aching    legs and arms for a couple of months  Patient with a multitude of symptoms left lateral IT band leg pain and is largely resolved now leg pain is primarily in anterior thigh areas. Patient's limited going to grocery store or [employer] because concerned she won't get out. This is not limited by her legs but by shortness of breath. I reviewed Dr. Leonette Most notes and stress tests which were negative patient had orthostatic hypotension and deconditioning and at that time was felt to have low likelihood of cardiovascular causes. Patient's leg pain is not really worse with exertion with no claudication type symptoms. Symptoms are really painful in the tops of both her legs when getting out of bed and difficulty moving at that time. Patient also in the same breath mentions her arms are also very stiff and sore and rub in her biceps areas. Reviewed patient is taking Lipitor 40 mg daily  Patient also with some reflux symptoms.   Relevant past medical, surgical, family and social history reviewed and updated as indicated. Interim medical history since our last visit reviewed. Allergies and medications reviewed and updated.  Review of Systems  Constitutional: Negative.   Respiratory: Negative.   Cardiovascular: Negative.     Per HPI unless specifically indicated above     Objective:    BP 129/72 (BP Location: Left Arm, Patient Position: Sitting, Cuff Size: Normal)   Pulse 81   Temp 97.9 F (36.6 C)   Ht 5\' 8"  (1.727 m)   Wt 198 lb (89.8 kg)   SpO2 95%   BMI 30.11 kg/m   Wt Readings from Last 3  Encounters:  01/24/16 198 lb (89.8 kg)  11/30/15 199 lb (90.3 kg)  11/21/15 200 lb (90.7 kg)    Physical Exam  Constitutional: She is oriented to person, place, and time. She appears well-developed and well-nourished. No distress.  HENT:  Head: Normocephalic and atraumatic.  Right Ear: Hearing normal.  Left Ear: Hearing normal.  Nose: Nose normal.  Eyes: Conjunctivae and lids are normal. Right eye exhibits no discharge. Left eye exhibits no discharge. No scleral icterus.  Cardiovascular: Normal rate, regular rhythm and normal heart sounds.   Pulmonary/Chest: Effort normal and breath sounds normal. No respiratory distress.  Musculoskeletal: Normal range of motion.  Leg exam normal pain or tenderness for range of motion no musculoskeletal tenderness noted  Neurological: She is alert and oriented to person, place, and time.  Skin: Skin is intact. No rash noted.  Psychiatric: She has a normal mood and affect. Her speech is normal and behavior is normal. Judgment and thought content normal. Cognition and memory are normal.        Assessment & Plan:   Problem List Items Addressed This Visit      Cardiovascular and Mediastinum   Hypertension    The current medical regimen is effective;  continue present plan and medications.         Genitourinary  Chronic kidney disease, stage 4, severely decreased GFR (HCC)    Followed by nephrology        Other   Hyperlipidemia    We will stop medication due to possible myalgias and observe symptoms patient will do diet control of cholesterol for now.      Sore muscles    Discuss leg soreness and arm soreness may be related to Lipitor will stop Lipitor and observe symptoms       Other Visit Diagnoses   None.      Follow up plan: Return in about 4 weeks (around 02/21/2016), or if symptoms worsen or fail to improve, for Follow-up myalgias and stopping Lipitor.

## 2016-01-24 NOTE — Assessment & Plan Note (Signed)
Followed by nephrology. 

## 2016-01-24 NOTE — Assessment & Plan Note (Signed)
We will stop medication due to possible myalgias and observe symptoms patient will do diet control of cholesterol for now.

## 2016-01-24 NOTE — Assessment & Plan Note (Signed)
The current medical regimen is effective;  continue present plan and medications.  

## 2016-02-15 ENCOUNTER — Ambulatory Visit: Payer: Medicare Other | Admitting: Family Medicine

## 2016-02-15 ENCOUNTER — Telehealth: Payer: Self-pay | Admitting: Family Medicine

## 2016-02-15 NOTE — Telephone Encounter (Signed)
Pt had an appt scheduled but had to leave but would like a call back.

## 2016-02-15 NOTE — Telephone Encounter (Signed)
Phone call Patient feeling much better multitude of aches and pains have largely resolved with stopping atorvastatin will observe cholesterol for now and recheck at physical.

## 2016-02-28 ENCOUNTER — Ambulatory Visit (INDEPENDENT_AMBULATORY_CARE_PROVIDER_SITE_OTHER): Payer: Medicare Other

## 2016-02-28 DIAGNOSIS — Z23 Encounter for immunization: Secondary | ICD-10-CM | POA: Diagnosis not present

## 2016-03-06 DIAGNOSIS — I10 Essential (primary) hypertension: Secondary | ICD-10-CM | POA: Diagnosis not present

## 2016-03-06 DIAGNOSIS — E872 Acidosis: Secondary | ICD-10-CM | POA: Diagnosis not present

## 2016-03-06 DIAGNOSIS — N2581 Secondary hyperparathyroidism of renal origin: Secondary | ICD-10-CM | POA: Diagnosis not present

## 2016-03-06 DIAGNOSIS — N183 Chronic kidney disease, stage 3 (moderate): Secondary | ICD-10-CM | POA: Diagnosis not present

## 2016-03-16 ENCOUNTER — Other Ambulatory Visit: Payer: Self-pay | Admitting: Family Medicine

## 2016-03-16 DIAGNOSIS — I10 Essential (primary) hypertension: Secondary | ICD-10-CM

## 2016-03-16 DIAGNOSIS — E039 Hypothyroidism, unspecified: Secondary | ICD-10-CM

## 2016-06-11 DIAGNOSIS — R69 Illness, unspecified: Secondary | ICD-10-CM | POA: Diagnosis not present

## 2016-06-21 ENCOUNTER — Encounter: Payer: Medicare HMO | Admitting: Family Medicine

## 2016-06-22 ENCOUNTER — Ambulatory Visit (INDEPENDENT_AMBULATORY_CARE_PROVIDER_SITE_OTHER): Payer: Medicare HMO | Admitting: Family Medicine

## 2016-06-22 ENCOUNTER — Encounter: Payer: Self-pay | Admitting: Family Medicine

## 2016-06-22 VITALS — BP 164/74 | HR 80 | Temp 98.5°F | Ht 68.5 in | Wt 192.0 lb

## 2016-06-22 DIAGNOSIS — N184 Chronic kidney disease, stage 4 (severe): Secondary | ICD-10-CM

## 2016-06-22 DIAGNOSIS — E785 Hyperlipidemia, unspecified: Secondary | ICD-10-CM

## 2016-06-22 DIAGNOSIS — Z0000 Encounter for general adult medical examination without abnormal findings: Secondary | ICD-10-CM

## 2016-06-22 DIAGNOSIS — E039 Hypothyroidism, unspecified: Secondary | ICD-10-CM

## 2016-06-22 DIAGNOSIS — I10 Essential (primary) hypertension: Secondary | ICD-10-CM | POA: Diagnosis not present

## 2016-06-22 MED ORDER — LEVOTHYROXINE SODIUM 50 MCG PO TABS: 50.0000 ug | ORAL_TABLET | Freq: Every day | ORAL | 4 refills | Status: DC

## 2016-06-22 MED ORDER — HYDRALAZINE HCL 25 MG PO TABS: 50.0000 mg | ORAL_TABLET | Freq: Every day | ORAL | 4 refills | Status: DC

## 2016-06-22 MED ORDER — AMLODIPINE BESYLATE 10 MG PO TABS: 10.0000 mg | ORAL_TABLET | Freq: Every day | ORAL | 4 refills | Status: DC

## 2016-06-22 MED ORDER — LISINOPRIL 10 MG PO TABS: 10.0000 mg | ORAL_TABLET | Freq: Every day | ORAL | 4 refills | Status: DC

## 2016-06-22 NOTE — Assessment & Plan Note (Signed)
Elevated blood pressure today has appointment next week with nephrology had dental procedure earlier this week will observe blood pressure for now patient will intensify nonpharmacologic therapy.

## 2016-06-22 NOTE — Progress Notes (Signed)
BP (!) 164/74   Pulse 80   Temp 98.5 F (36.9 C) (Oral)   Ht 5' 8.5" (1.74 m)   Wt 192 lb (87.1 kg)   SpO2 99%   BMI 28.77 kg/m    Subjective:    Patient ID: Karla Little, female    DOB: [birth year], 83 y.o.   MRN: [id number]  HPI: Karla Little is a 83 y.o. female  Chief Complaint  Patient presents with  . Annual Exam  Patient with stopping Lipitor aching all over when away after a few weeks to a month or so. Followed by nephrology doing stable Thyroid doing okay. Patient's blood pressures up today and feels a little shaky and jittery has taken her medications faithfully. On review of nephrology appointments patient's blood pressures been stable. Did have dental procedure 3 days ago maybe some leftover issues from that. Relevant past medical, surgical, family and social history reviewed and updated as indicated. Interim medical history since our last visit reviewed. Allergies and medications reviewed and updated.  Review of Systems  Constitutional: Negative.   HENT: Negative.   Eyes: Negative.   Respiratory: Negative.   Cardiovascular: Negative.   Gastrointestinal: Negative.   Endocrine: Negative.   Genitourinary: Negative.   Musculoskeletal: Negative.   Skin: Negative.   Allergic/Immunologic: Negative.   Neurological: Negative.   Hematological: Negative.   Psychiatric/Behavioral: Negative.     Per HPI unless specifically indicated above     Objective:    BP (!) 164/74   Pulse 80   Temp 98.5 F (36.9 C) (Oral)   Ht 5' 8.5" (1.74 m)   Wt 192 lb (87.1 kg)   SpO2 99%   BMI 28.77 kg/m   Wt Readings from Last 3 Encounters:  06/22/16 192 lb (87.1 kg)  01/24/16 198 lb (89.8 kg)  11/30/15 199 lb (90.3 kg)    Physical Exam  Constitutional: She is oriented to person, place, and time. She appears well-developed and well-nourished.  HENT:  Head: Normocephalic and atraumatic.  Right Ear: External ear normal.  Left Ear: External ear normal.  Nose: Nose  normal.  Mouth/Throat: Oropharynx is clear and moist.  Eyes: Conjunctivae and EOM are normal. Pupils are equal, round, and reactive to light.  Neck: Normal range of motion. Neck supple. Carotid bruit is not present.  Cardiovascular: Normal rate, regular rhythm and normal heart sounds.   No murmur heard. Pulmonary/Chest: Effort normal and breath sounds normal. She exhibits no mass. Right breast exhibits no mass, no skin change and no tenderness. Left breast exhibits no mass, no skin change and no tenderness. Breasts are symmetrical.  Abdominal: Soft. Bowel sounds are normal. There is no hepatosplenomegaly.  Musculoskeletal: Normal range of motion.  Neurological: She is alert and oriented to person, place, and time.  Skin: No rash noted.  Psychiatric: She has a normal mood and affect. Her behavior is normal. Judgment and thought content normal.        Assessment & Plan:   Problem List Items Addressed This Visit      Cardiovascular and Mediastinum   Hypertension    Elevated blood pressure today has appointment next week with nephrology had dental procedure earlier this week will observe blood pressure for now patient will intensify nonpharmacologic therapy.      Relevant Medications   amLODipine (NORVASC) 10 MG tablet   hydrALAZINE (APRESOLINE) 25 MG tablet   lisinopril (PRINIVIL,ZESTRIL) 10 MG tablet   Other Relevant Orders   Comprehensive metabolic panel  CBC with Differential/Platelet     Endocrine   Hypothyroidism    Apparently stable will leave alone draw blood work      Relevant Medications   levothyroxine (SYNTHROID, LEVOTHROID) 50 MCG tablet   Other Relevant Orders   TSH     Genitourinary   Chronic kidney disease, stage 4, severely decreased GFR (HCC)    Followed by nephrology      Relevant Orders   Comprehensive metabolic panel   CBC with Differential/Platelet     Other   Hyperlipidemia    Managed by diet will leave alone for now.      Relevant  Medications   amLODipine (NORVASC) 10 MG tablet   hydrALAZINE (APRESOLINE) 25 MG tablet   lisinopril (PRINIVIL,ZESTRIL) 10 MG tablet   Other Relevant Orders   Lipid panel   CBC with Differential/Platelet    Other Visit Diagnoses    Annual physical exam    -  Primary       Follow up plan: Return in about 1 year (around 06/22/2017) for Physical Exam.

## 2016-06-22 NOTE — Assessment & Plan Note (Signed)
Managed by diet will leave alone for now.

## 2016-06-22 NOTE — Assessment & Plan Note (Signed)
Apparently stable will leave alone draw blood work

## 2016-06-22 NOTE — Assessment & Plan Note (Signed)
Followed by nephrology. 

## 2016-06-23 LAB — LIPID PANEL
CHOLESTEROL TOTAL: 243 mg/dL — AB (ref 100–199)
Chol/HDL Ratio: 3.6 ratio units (ref 0.0–4.4)
HDL: 67 mg/dL (ref >39)
LDL CALC: 147 mg/dL — AB (ref 0–99)
Triglycerides: 143 mg/dL (ref 0–149)
VLDL CHOLESTEROL CAL: 29 mg/dL (ref 5–40)

## 2016-06-23 LAB — CBC WITH DIFFERENTIAL/PLATELET
BASOS: 0 %
Basophils Absolute: 0 10*3/uL (ref 0.0–0.2)
EOS (ABSOLUTE): 0.1 10*3/uL (ref 0.0–0.4)
EOS: 2 %
HEMOGLOBIN: 12.9 g/dL (ref 11.1–15.9)
Hematocrit: 37.2 % (ref 34.0–46.6)
IMMATURE GRANS (ABS): 0 10*3/uL (ref 0.0–0.1)
IMMATURE GRANULOCYTES: 0 %
LYMPHS: 35 %
Lymphocytes Absolute: 2.4 10*3/uL (ref 0.7–3.1)
MCH: 30.7 pg (ref 26.6–33.0)
MCHC: 34.7 g/dL (ref 31.5–35.7)
MCV: 89 fL (ref 79–97)
MONOCYTES: 12 %
Monocytes Absolute: 0.8 10*3/uL (ref 0.1–0.9)
Neutrophils Absolute: 3.5 10*3/uL (ref 1.4–7.0)
Neutrophils: 51 %
PLATELETS: 291 10*3/uL (ref 150–379)
RBC: 4.2 x10E6/uL (ref 3.77–5.28)
RDW: 14.1 % (ref 12.3–15.4)
WBC: 6.8 10*3/uL (ref 3.4–10.8)

## 2016-06-23 LAB — COMPREHENSIVE METABOLIC PANEL
ALK PHOS: 68 IU/L (ref 39–117)
ALT: 10 IU/L (ref 0–32)
AST: 16 IU/L (ref 0–40)
Albumin/Globulin Ratio: 1.8 (ref 1.2–2.2)
Albumin: 4.3 g/dL (ref 3.5–4.7)
BUN / CREAT RATIO: 12 (ref 12–28)
BUN: 18 mg/dL (ref 8–27)
Bilirubin Total: 0.3 mg/dL (ref 0.0–1.2)
CO2: 20 mmol/L (ref 18–29)
CREATININE: 1.49 mg/dL — AB (ref 0.57–1.00)
Calcium: 9.4 mg/dL (ref 8.7–10.3)
Chloride: 100 mmol/L (ref 96–106)
GFR calc Af Amer: 38 mL/min/{1.73_m2} — ABNORMAL LOW (ref >59)
GFR calc non Af Amer: 33 mL/min/{1.73_m2} — ABNORMAL LOW (ref >59)
GLUCOSE: 101 mg/dL — AB (ref 65–99)
Globulin, Total: 2.4 g/dL (ref 1.5–4.5)
Potassium: 4.5 mmol/L (ref 3.5–5.2)
Sodium: 136 mmol/L (ref 134–144)
TOTAL PROTEIN: 6.7 g/dL (ref 6.0–8.5)

## 2016-06-23 LAB — TSH: TSH: 2.3 u[IU]/mL (ref 0.450–4.500)

## 2016-06-26 DIAGNOSIS — N183 Chronic kidney disease, stage 3 (moderate): Secondary | ICD-10-CM | POA: Diagnosis not present

## 2016-06-26 DIAGNOSIS — N2581 Secondary hyperparathyroidism of renal origin: Secondary | ICD-10-CM | POA: Diagnosis not present

## 2016-06-26 DIAGNOSIS — E872 Acidosis: Secondary | ICD-10-CM | POA: Diagnosis not present

## 2016-06-26 DIAGNOSIS — I10 Essential (primary) hypertension: Secondary | ICD-10-CM | POA: Diagnosis not present

## 2016-08-23 DIAGNOSIS — L538 Other specified erythematous conditions: Secondary | ICD-10-CM | POA: Diagnosis not present

## 2016-08-23 DIAGNOSIS — D485 Neoplasm of uncertain behavior of skin: Secondary | ICD-10-CM | POA: Diagnosis not present

## 2016-08-23 DIAGNOSIS — Z85828 Personal history of other malignant neoplasm of skin: Secondary | ICD-10-CM | POA: Diagnosis not present

## 2016-08-23 DIAGNOSIS — L820 Inflamed seborrheic keratosis: Secondary | ICD-10-CM | POA: Diagnosis not present

## 2016-08-23 DIAGNOSIS — Z08 Encounter for follow-up examination after completed treatment for malignant neoplasm: Secondary | ICD-10-CM | POA: Diagnosis not present

## 2016-08-23 DIAGNOSIS — L821 Other seborrheic keratosis: Secondary | ICD-10-CM | POA: Diagnosis not present

## 2016-08-23 DIAGNOSIS — L570 Actinic keratosis: Secondary | ICD-10-CM | POA: Diagnosis not present

## 2016-08-23 DIAGNOSIS — X32XXXA Exposure to sunlight, initial encounter: Secondary | ICD-10-CM | POA: Diagnosis not present

## 2016-09-13 ENCOUNTER — Emergency Department
Admission: EM | Admit: 2016-09-13 | Discharge: 2016-09-13 | Disposition: A | Discharge status: Home / Self Care | Payer: Medicare HMO | Attending: Emergency Medicine | Admitting: Emergency Medicine

## 2016-09-13 ENCOUNTER — Emergency Department: Payer: Medicare HMO

## 2016-09-13 DIAGNOSIS — I129 Hypertensive chronic kidney disease with stage 1 through stage 4 chronic kidney disease, or unspecified chronic kidney disease: Secondary | ICD-10-CM | POA: Diagnosis not present

## 2016-09-13 DIAGNOSIS — Z79899 Other long term (current) drug therapy: Secondary | ICD-10-CM | POA: Insufficient documentation

## 2016-09-13 DIAGNOSIS — K8020 Calculus of gallbladder without cholecystitis without obstruction: Secondary | ICD-10-CM | POA: Diagnosis not present

## 2016-09-13 DIAGNOSIS — K5732 Diverticulitis of large intestine without perforation or abscess without bleeding: Secondary | ICD-10-CM | POA: Diagnosis not present

## 2016-09-13 DIAGNOSIS — R1032 Left lower quadrant pain: Secondary | ICD-10-CM | POA: Diagnosis present

## 2016-09-13 DIAGNOSIS — E039 Hypothyroidism, unspecified: Secondary | ICD-10-CM | POA: Diagnosis not present

## 2016-09-13 DIAGNOSIS — N184 Chronic kidney disease, stage 4 (severe): Secondary | ICD-10-CM | POA: Insufficient documentation

## 2016-09-13 HISTORY — DX: Disorder of kidney and ureter, unspecified: N28.9

## 2016-09-13 LAB — URINALYSIS, COMPLETE (UACMP) WITH MICROSCOPIC
Bilirubin Urine: NEGATIVE
GLUCOSE, UA: NEGATIVE mg/dL
HGB URINE DIPSTICK: NEGATIVE
KETONES UR: NEGATIVE mg/dL
Leukocytes, UA: NEGATIVE
NITRITE: NEGATIVE
PROTEIN: NEGATIVE mg/dL
Specific Gravity, Urine: 1.005 (ref 1.005–1.030)
pH: 5 (ref 5.0–8.0)

## 2016-09-13 LAB — COMPREHENSIVE METABOLIC PANEL
ALBUMIN: 4.2 g/dL (ref 3.5–5.0)
ALK PHOS: 63 U/L (ref 38–126)
ALT: 14 U/L (ref 14–54)
ANION GAP: 10 (ref 5–15)
AST: 21 U/L (ref 15–41)
BUN: 27 mg/dL — ABNORMAL HIGH (ref 6–20)
CALCIUM: 9.4 mg/dL (ref 8.9–10.3)
CO2: 19 mmol/L — AB (ref 22–32)
Chloride: 101 mmol/L (ref 101–111)
Creatinine, Ser: 1.41 mg/dL — ABNORMAL HIGH (ref 0.44–1.00)
GFR calc non Af Amer: 34 mL/min — ABNORMAL LOW (ref >60)
GFR, EST AFRICAN AMERICAN: 39 mL/min — AB (ref >60)
Glucose, Bld: 110 mg/dL — ABNORMAL HIGH (ref 65–99)
POTASSIUM: 4.4 mmol/L (ref 3.5–5.1)
SODIUM: 130 mmol/L — AB (ref 135–145)
Total Bilirubin: 0.8 mg/dL (ref 0.3–1.2)
Total Protein: 7.1 g/dL (ref 6.5–8.1)

## 2016-09-13 LAB — CBC
HEMATOCRIT: 39.9 % (ref 35.0–47.0)
HEMOGLOBIN: 13.4 g/dL (ref 12.0–16.0)
MCH: 31.6 pg (ref 26.0–34.0)
MCHC: 33.6 g/dL (ref 32.0–36.0)
MCV: 93.9 fL (ref 80.0–100.0)
Platelets: 275 10*3/uL (ref 150–440)
RBC: 4.25 MIL/uL (ref 3.80–5.20)
RDW: 13.9 % (ref 11.5–14.5)
WBC: 7 10*3/uL (ref 3.6–11.0)

## 2016-09-13 LAB — LACTIC ACID, PLASMA: Lactic Acid, Venous: 1.7 mmol/L (ref 0.5–1.9)

## 2016-09-13 LAB — TROPONIN I: Troponin I: <0.03 ng/mL (ref <0.03)

## 2016-09-13 LAB — LIPASE, BLOOD: LIPASE: 34 U/L (ref 11–51)

## 2016-09-13 MED ORDER — IOPAMIDOL (ISOVUE-300) INJECTION 61%
30.0000 mL | Freq: Once | INTRAVENOUS | Status: AC
  Administered 2016-09-13: 30 mL via ORAL

## 2016-09-13 MED ORDER — ONDANSETRON HCL 4 MG/2ML IJ SOLN
4.0000 mg | Freq: Once | INTRAMUSCULAR | Status: AC
  Administered 2016-09-13: 4 mg via INTRAVENOUS
  Filled 2016-09-13: qty 2

## 2016-09-13 MED ORDER — MORPHINE SULFATE (PF) 4 MG/ML IV SOLN
4.0000 mg | Freq: Once | INTRAVENOUS | Status: AC
  Administered 2016-09-13: 4 mg via INTRAVENOUS
  Filled 2016-09-13: qty 1

## 2016-09-13 MED ORDER — AMOXICILLIN-POT CLAVULANATE 875-125 MG PO TABS
1.0000 | ORAL_TABLET | Freq: Once | ORAL | Status: AC
  Administered 2016-09-13: 1 via ORAL
  Filled 2016-09-13: qty 1

## 2016-09-13 MED ORDER — SODIUM CHLORIDE 0.9 % IV BOLUS (SEPSIS)
1000.0000 mL | Freq: Once | INTRAVENOUS | Status: AC
  Administered 2016-09-13: 1000 mL via INTRAVENOUS

## 2016-09-13 MED ORDER — AMOXICILLIN-POT CLAVULANATE 875-125 MG PO TABS: 1.0000 | ORAL_TABLET | Freq: Two times a day (BID) | ORAL | 0 refills | Status: AC

## 2016-09-13 MED ORDER — METRONIDAZOLE 500 MG PO TABS: 500.0000 mg | ORAL_TABLET | Freq: Three times a day (TID) | ORAL | 0 refills | Status: AC

## 2016-09-13 MED ORDER — IOPAMIDOL (ISOVUE-300) INJECTION 61%
75.0000 mL | Freq: Once | INTRAVENOUS | Status: AC | PRN
Start: 2016-09-13 — End: 2016-09-13
  Administered 2016-09-13: 75 mL via INTRAVENOUS

## 2016-09-13 MED ORDER — ONDANSETRON HCL 4 MG PO TABS: 4.0000 mg | ORAL_TABLET | Freq: Every day | ORAL | 0 refills | Status: DC | PRN

## 2016-09-13 MED ORDER — OXYCODONE-ACETAMINOPHEN 5-325 MG PO TABS: 1.0000 | ORAL_TABLET | Freq: Four times a day (QID) | ORAL | 0 refills | Status: DC | PRN

## 2016-09-13 MED ORDER — METRONIDAZOLE 500 MG PO TABS
500.0000 mg | ORAL_TABLET | Freq: Once | ORAL | Status: AC
  Administered 2016-09-13: 500 mg via ORAL
  Filled 2016-09-13: qty 1

## 2016-09-13 NOTE — ED Notes (Signed)

## 2016-09-13 NOTE — ED Notes (Signed)
Patients daughter requesting her number to be left.   Stacy: [phone].

## 2016-09-13 NOTE — ED Provider Notes (Signed)
[facility] Emergency Department Provider Note  ____________________________________________   First MD Initiated Contact with Patient 09/13/16 1548     (approximate)  I have reviewed the triage vital signs and the nursing notes.   HISTORY  Chief Complaint Abdominal Pain and Dizziness    HPI Karla Little is a 83 y.o. female who self presents to the emergency department reporting 1 week of intermittent severe cramping left lower quadrant pain nonradiating. She has had intermittent constipation but is taken laxatives and subsequently had significant diarrhea. She has no abdominal surgical history.She denies fevers or chills. She denies chest pain or shortness of breath. Her pain is aching cramping constant nothing makes it better or worse. It is nonradiating.   Past Medical History:  Diagnosis Date  . Chronic kidney disease   . Hyperlipidemia   . Hypertension   . Hypothyroidism   . Osteoporosis   . Renal insufficiency   . Vertigo     Patient Active Problem List   Diagnosis Date Noted  . Atrophy of right kidney 03/16/2015  . Chronic kidney disease, stage 4, severely decreased GFR (HCC) 03/16/2015  . Shoulder bursitis 03/16/2015  . Hypothyroidism   . Hyperlipidemia   . Hypertension     Past Surgical History:  Procedure Laterality Date  . BREAST BIOPSY    . COLONOSCOPY W/ POLYPECTOMY    . COLONOSCOPY WITH PROPOFOL N/A 06/02/2015   Procedure: COLONOSCOPY WITH PROPOFOL;  Surgeon: Manya Silvas, MD;  Location: [facility] ENDOSCOPY;  Service: Endoscopy;  Laterality: N/A;    Prior to Admission medications   Medication Sig Start Date End Date Taking? Authorizing Provider  amLODipine (NORVASC) 10 MG tablet Take 1 tablet (10 mg total) by mouth daily. 06/22/16   [name], MD  amoxicillin-clavulanate (AUGMENTIN) 875-125 MG tablet Take 1 tablet by mouth 2 (two) times daily. 09/13/16 09/27/16  Darel Hong, MD  calcitRIOL (ROCALTROL) 0.25 MCG  capsule Take 0.25 mcg by mouth. 11/24/15   Historical Provider, MD  Calcium Citrate-Vitamin D (CALCIUM + D PO) Take by mouth daily.    Historical Provider, MD  cyclobenzaprine (FLEXERIL) 5 MG tablet Take 1 tablet (5 mg total) by mouth 3 (three) times daily as needed for muscle spasms. Patient not taking: Reported on 01/24/2016 01/02/16   Volney American, PA-C  hydrALAZINE (APRESOLINE) 25 MG tablet Take 2 tablets (50 mg total) by mouth daily. 06/22/16   [name], MD  levothyroxine (SYNTHROID, LEVOTHROID) 50 MCG tablet Take 1 tablet (50 mcg total) by mouth daily. 06/22/16   [name], MD  lisinopril (PRINIVIL,ZESTRIL) 10 MG tablet Take 1 tablet (10 mg total) by mouth daily. 06/22/16   [name], MD  metroNIDAZOLE (FLAGYL) 500 MG tablet Take 1 tablet (500 mg total) by mouth 3 (three) times daily. 09/13/16 09/27/16  Darel Hong, MD  nitroGLYCERIN (NITROSTAT) 0.4 MG SL tablet Place 0.4 mg under the tongue every 5 (five) minutes as needed for chest pain.    Historical Provider, MD  ondansetron (ZOFRAN) 4 MG tablet Take 1 tablet (4 mg total) by mouth daily as needed for nausea or vomiting. 09/13/16 09/13/17  Darel Hong, MD  oxyCODONE-acetaminophen (ROXICET) 5-325 MG tablet Take 1 tablet by mouth every 6 (six) hours as needed. 09/13/16   Darel Hong, MD  sodium bicarbonate 650 MG tablet  02/25/15   Historical Provider, MD  vitamin B-12 (CYANOCOBALAMIN) 250 MCG tablet Take 250 mcg by mouth daily.    Historical Provider, MD  Vitamin D, Ergocalciferol, (DRISDOL) 50000 UNITS CAPS capsule Take 50,000 Units by mouth daily.     Historical Provider, MD  vitamin E 200 UNIT capsule Take 200 Units by mouth daily.    Historical Provider, MD    Allergies Patient has no known allergies.  Family History  Problem Relation Age of Onset  . Hypertension Mother   . Heart disease Father   . Cancer Sister     breast    Social History Social History  Substance Use Topics  . Smoking status:  Never Smoker  . Smokeless tobacco: Never Used  . Alcohol use No    Review of Systems Constitutional: No fever/chills Eyes: No visual changes. ENT: No sore throat. Cardiovascular: Denies chest pain. Respiratory: Denies shortness of breath. Gastrointestinal: Positive abdominal pain.  Positive nausea, no vomiting.  Positive diarrhea.  Positive constipation. Genitourinary: Negative for dysuria. Musculoskeletal: Negative for back pain. Skin: Negative for rash. Neurological: Negative for headaches, focal weakness or numbness.  10-point ROS otherwise negative.  ____________________________________________   PHYSICAL EXAM:  VITAL SIGNS: ED Triage Vitals  Enc Vitals Group     BP 09/13/16 1327 (!) 161/57     Pulse Rate 09/13/16 1327 84     Resp 09/13/16 1327 18     Temp 09/13/16 1327 98.6 F (37 C)     Temp Source 09/13/16 1327 Oral     SpO2 09/13/16 1327 98 %     Weight 09/13/16 1322 190 lb (86.2 kg)     Height 09/13/16 1322 5\' 9"  (1.753 m)     Head Circumference --      Peak Flow --      Pain Score 09/13/16 1322 8     Pain Loc --      Pain Edu? --      Excl. in [place]? --     Constitutional: Alert and oriented x 4 well appearing nontoxic no diaphoresis speaks in full, clear sentences Eyes: PERRL EOMI. Head: Atraumatic. Nose: No congestion/rhinnorhea. Mouth/Throat: No trismus Neck: No stridor.   Cardiovascular: Normal rate, regular rhythm. Grossly normal heart sounds.  Good peripheral circulation. Respiratory: Normal respiratory effort.  No retractions. Lungs CTAB and moving good air Gastrointestinal: Soft nondistended And mildly tender left lower quadrant with rebound but no guarding no frank peritonitis no McBurney's tenderness negative Rovsing's Musculoskeletal: No lower extremity edema   Neurologic:  Normal speech and language. No gross focal neurologic deficits are appreciated. Skin:  Skin is warm, dry and intact. No rash noted. Psychiatric: Mood and affect are normal.  Speech and behavior are normal.    ____________________________________________   DIFFERENTIAL  Diverticulitis, appendicitis, mesenteric adenitis, acute coronary syndrome, pulmonary embolism, pneumonia   LABS (all labs ordered are listed, but only abnormal results are displayed)  Labs Reviewed  COMPREHENSIVE METABOLIC PANEL - Abnormal; Notable for the following:       Result Value   Sodium 130 (*)    CO2 19 (*)    Glucose, Bld 110 (*)    BUN 27 (*)    Creatinine, Ser 1.41 (*)    GFR calc non Af Amer 34 (*)    GFR calc Af Amer 39 (*)    All other components within normal limits  URINALYSIS, COMPLETE (UACMP) WITH MICROSCOPIC - Abnormal; Notable for the following:    Color, Urine YELLOW (*)    APPearance CLEAR (*)    Bacteria, UA RARE (*)    Squamous Epithelial / LPF 0-5 (*)    All other  components within normal limits  LIPASE, BLOOD  CBC  TROPONIN I  LACTIC ACID, PLASMA    Slightly elevated creatinine but consistent with previous no signs of urinary tract infection __________________________________________  EKG  ED ECG REPORT I, Darel Hong, the attending physician, personally viewed and interpreted this ECG.  Date: 09/13/2016 Rate: 81 Rhythm: normal sinus rhythm QRS Axis: normal Intervals: normal ST/T Wave abnormalities: T-wave inversion in V3 Conduction Disturbances: none Narrative Interpretation: Borderline  ____________________________________________  RADIOLOGY  CT showing mild left lower quadrant diverticulitis ____________________________________________   PROCEDURES  Procedure(s) performed: o  Procedures  Critical Care performed: no  ____________________________________________   INITIAL IMPRESSION / ASSESSMENT AND PLAN / ED COURSE  Pertinent labs & imaging results that were available during my care of the patient were reviewed by me and considered in my medical decision making (see chart for details).  By the time I saw the  patient she guarded he had blood work which is largely unremarkable. She does have significant tenderness in her left lower quadrant which is concerning for diverticulitis. I will obtain a CT scan to confirm.  The CT shows simple non-complicated sigmoid diverticulitis. I will give her first dose of Augmentin and Flagyl here and a 2 week course of treatment. Strict return precautions given and the patient is medically stable for outpatient management understands and agrees the plan. She is to have a 2 day abdominal recheck. ____________________________________________   FINAL CLINICAL IMPRESSION(S) / ED DIAGNOSES  Final diagnoses:  Diverticulitis of large intestine without perforation or abscess without bleeding      NEW MEDICATIONS STARTED DURING THIS VISIT:  Discharge Medication List as of 09/13/2016  6:36 PM    START taking these medications   Details  amoxicillin-clavulanate (AUGMENTIN) 875-125 MG tablet Take 1 tablet by mouth 2 (two) times daily., Starting Thu 09/13/2016, Until Thu 09/27/2016, Print    metroNIDAZOLE (FLAGYL) 500 MG tablet Take 1 tablet (500 mg total) by mouth 3 (three) times daily., Starting Thu 09/13/2016, Until Thu 09/27/2016, Print    ondansetron (ZOFRAN) 4 MG tablet Take 1 tablet (4 mg total) by mouth daily as needed for nausea or vomiting., Starting Thu 09/13/2016, Until Fri 09/13/2017, Print    oxyCODONE-acetaminophen (ROXICET) 5-325 MG tablet Take 1 tablet by mouth every 6 (six) hours as needed., Starting Thu 09/13/2016, Print         Note:  This document was prepared using Dragon voice recognition software and may include unintentional dictation errors.     Darel Hong, MD 09/14/16 1558

## 2016-09-13 NOTE — Discharge Instructions (Signed)
Please take all of your antibiotics as prescribed and follow up with your primary care physician on Monday for recheck. Return to the emergency department for any concerns such as worsening pain, if you cannot eat or drink, or for any other concerns whatsoever.  It was a pleasure to take care of you today, and thank you for coming to our emergency department.  If you have any questions or concerns before leaving please ask the nurse to grab me and I'm more than happy to go through your aftercare instructions again.  If you were prescribed any opioid pain medication today such as Norco, Vicodin, Percocet, morphine, hydrocodone, or oxycodone please make sure you do not drive when you are taking this medication as it can alter your ability to drive safely.  If you have any concerns once you are home that you are not improving or are in fact getting worse before you can make it to your follow-up appointment, please do not hesitate to call [phone] and come back for further evaluation.  Darel Hong MD  Results for orders placed or performed during the hospital encounter of 09/13/16  Lipase, blood  Result Value Ref Range   Lipase 34 11 - 51 U/L  Comprehensive metabolic panel  Result Value Ref Range   Sodium 130 (L) 135 - 145 mmol/L   Potassium 4.4 3.5 - 5.1 mmol/L   Chloride 101 101 - 111 mmol/L   CO2 19 (L) 22 - 32 mmol/L   Glucose, Bld 110 (H) 65 - 99 mg/dL   BUN 27 (H) 6 - 20 mg/dL   Creatinine, Ser 1.41 (H) 0.44 - 1.00 mg/dL   Calcium 9.4 8.9 - 10.3 mg/dL   Total Protein 7.1 6.5 - 8.1 g/dL   Albumin 4.2 3.5 - 5.0 g/dL   AST 21 15 - 41 U/L   ALT 14 14 - 54 U/L   Alkaline Phosphatase 63 38 - 126 U/L   Total Bilirubin 0.8 0.3 - 1.2 mg/dL   GFR calc non Af Amer 34 (L) >60 mL/min   GFR calc Af Amer 39 (L) >60 mL/min   Anion gap 10 5 - 15  CBC  Result Value Ref Range   WBC 7.0 3.6 - 11.0 K/uL   RBC 4.25 3.80 - 5.20 MIL/uL   Hemoglobin 13.4 12.0 - 16.0 g/dL   HCT 39.9 35.0 - 47.0 %   MCV  93.9 80.0 - 100.0 fL   MCH 31.6 26.0 - 34.0 pg   MCHC 33.6 32.0 - 36.0 g/dL   RDW 13.9 11.5 - 14.5 %   Platelets 275 150 - 440 K/uL  Urinalysis, Complete w Microscopic  Result Value Ref Range   Color, Urine YELLOW (A) YELLOW   APPearance CLEAR (A) CLEAR   Specific Gravity, Urine 1.005 1.005 - 1.030   pH 5.0 5.0 - 8.0   Glucose, UA NEGATIVE NEGATIVE mg/dL   Hgb urine dipstick NEGATIVE NEGATIVE   Bilirubin Urine NEGATIVE NEGATIVE   Ketones, ur NEGATIVE NEGATIVE mg/dL   Protein, ur NEGATIVE NEGATIVE mg/dL   Nitrite NEGATIVE NEGATIVE   Leukocytes, UA NEGATIVE NEGATIVE   RBC / HPF 0-5 0 - 5 RBC/hpf   WBC, UA 0-5 0 - 5 WBC/hpf   Bacteria, UA RARE (A) NONE SEEN   Squamous Epithelial / LPF 0-5 (A) NONE SEEN   Mucous PRESENT   Troponin I  Result Value Ref Range   Troponin I <0.03 <0.03 ng/mL  Lactic acid, plasma  Result Value Ref Range  Lactic Acid, Venous 1.7 0.5 - 1.9 mmol/L   Ct Abdomen Pelvis W Contrast  Result Date: 09/13/2016 CLINICAL DATA:  Abdominal pain, primarily left lower quadrant EXAM: CT ABDOMEN AND PELVIS WITH CONTRAST TECHNIQUE: Multidetector CT imaging of the abdomen and pelvis was performed using the standard protocol following bolus administration of intravenous contrast. CONTRAST:  31mL ISOVUE-300 IOPAMIDOL (ISOVUE-300) INJECTION 61% COMPARISON:  Oct 06, 2010 FINDINGS: Lower chest: There is mild bibasilar lung atelectasis. There is a nodular opacity abutting the pleura in the superior segment of the left lower lobe measuring 4 mm. There is a moderate hiatal hernia. Hepatobiliary: There is a cyst in the posterior segment right lobe measuring 8 x 8 mm. No other focal liver lesions are evident. Within the gallbladder, there are multiple nitrogen containing gallstones. The gallbladder wall appears mildly thickened. There is no pericholecystic fluid evident by CT. There is no biliary duct dilatation. Pancreas: There is no appreciable pancreatic mass or inflammatory focus.  Spleen: There is a small calcified splenic granuloma. Spleen otherwise appears unremarkable. Adrenals/Urinary Tract: Adrenal glands appear normal bilaterally. The right kidney is atrophic. Left kidney shows fetal lobulations as well as an area of scarring in the posterior upper left kidney. There is no appreciable renal mass or hydronephrosis on either side. There is no appreciable ureteral or renal calculus on either side. Re- bladder is midline with wall thickness within normal limits. Stomach/Bowel: There are multiple sigmoid diverticula. There is wall thickening in the sigmoid colon region with subtle mesenteric thickening in this area, likely early changes of diverticulitis. There is no abscess or perforation in this area. No similar changes are seen elsewhere in the bowel. Elsewhere there is no bowel wall or mesenteric thickening. There is no evident bowel obstruction. No free air or portal venous air. Vascular/Lymphatic: There is atherosclerotic calcification in the aorta without aneurysm. Major mesenteric vessels appear patent on this study. There is no adenopathy in the abdomen or pelvis. Reproductive: Uterus is anteverted. No pelvic mass evident. A few tiny calcifications in the uterus are probably vascular, although tiny calcified leiomyomas could present in this manner. Other: Appendix appears normal. No abscess or ascites evident in the abdomen or pelvis. There are clips in the left inguinal region. No hernia is currently evident in this area. Musculoskeletal: There is degenerative change in the lumbar spine and pubic symphysis regions. There are no blastic or lytic bone lesions. IMPRESSION: Sigmoid diverticulosis with wall thickening and subtle mesenteric stranding in portions of this area. Suspect earliest changes of diverticulitis. No abscess or perforation. No similar appearing bowel inflammation elsewhere. No bowel obstruction. No abscess elsewhere in the abdomen or pelvis. Appendix appears normal.  Gallstones with subtle wall thickening of the gallbladder. Concern for a degree of acute cholecystitis. Atrophic right kidney. Scarring left kidney. No hydronephrosis on either side. No renal or ureteral calculus on either side. Aortic atherosclerosis. Or mm nodular opacity posterior left base. No follow-up needed if patient is low-risk. Non-contrast chest CT can be considered in 12 months if patient is high-risk. This recommendation follows the consensus statement: Guidelines for Management of Incidental Pulmonary Nodules Detected on CT Images: From the Fleischner Society 2017; Radiology 2017; 284:228-243. Electronically Signed   By: Lowella Grip III M.D.   On: 09/13/2016 18:00

## 2016-09-13 NOTE — ED Triage Notes (Signed)
Pt reports LLQ abdominal pain that started couple days ago. Pt reports constipation and last BM this am. Pt reports no N/V. Pt reports dizziness.

## 2016-09-21 ENCOUNTER — Encounter: Payer: Self-pay | Admitting: Family Medicine

## 2016-09-21 ENCOUNTER — Ambulatory Visit (INDEPENDENT_AMBULATORY_CARE_PROVIDER_SITE_OTHER): Payer: Medicare HMO | Admitting: Family Medicine

## 2016-09-21 VITALS — BP 179/78 | HR 90 | Temp 97.9°F | Wt 190.0 lb

## 2016-09-21 DIAGNOSIS — S61212A Laceration without foreign body of right middle finger without damage to nail, initial encounter: Secondary | ICD-10-CM

## 2016-09-21 NOTE — Patient Instructions (Signed)
Follow up if any issues with wound healing

## 2016-09-21 NOTE — Progress Notes (Signed)
   BP (!) 179/78 (BP Location: Right Arm, Patient Position: Sitting, Cuff Size: Normal)   Pulse 90   Temp 97.9 F (36.6 C)   Wt 190 lb (86.2 kg)   SpO2 95%   BMI 28.06 kg/m    Subjective:    Patient ID: Karla Little, female    DOB: Mar 13, 1934, 83 y.o.   MRN: [id number]  HPI: Karla Little is a 83 y.o. female  Chief Complaint  Patient presents with  . Laceration    Two fingers. Right hand.   Patient presents with multiple finger lacerations from attempting to remove a tin can lid a few minutes ago. Has one on pad of right middle finger and side of 4th digit. Has been applying pressure but not having any luck getting the bleeding to slow down. Pain is moderate, has not taken anything for relief yet. Not on any anticoagulation.  UTD on Td, and currently finishing a course of augmentin and flagyl for diverticulitis given in the ER.   Relevant past medical, surgical, family and social history reviewed and updated as indicated. Interim medical history since our last visit reviewed. Allergies and medications reviewed and updated.  Review of Systems  Constitutional: Negative.   HENT: Negative.   Respiratory: Negative.   Cardiovascular: Negative.   Gastrointestinal: Negative.   Genitourinary: Negative.   Musculoskeletal:       Right hand 3rd and 4th digit pain  Skin: Positive for wound.  Neurological: Negative.   Psychiatric/Behavioral: Negative.     Per HPI unless specifically indicated above     Objective:    BP (!) 179/78 (BP Location: Right Arm, Patient Position: Sitting, Cuff Size: Normal)   Pulse 90   Temp 97.9 F (36.6 C)   Wt 190 lb (86.2 kg)   SpO2 95%   BMI 28.06 kg/m   Wt Readings from Last 3 Encounters:  09/21/16 190 lb (86.2 kg)  09/13/16 190 lb (86.2 kg)  06/22/16 192 lb (87.1 kg)    Physical Exam  Constitutional: She is oriented to person, place, and time. She appears well-developed and well-nourished. No distress.  HENT:  Head: Atraumatic.    Eyes: Conjunctivae are normal. Pupils are equal, round, and reactive to light.  Neck: Normal range of motion. Neck supple.  Cardiovascular: Normal rate and normal heart sounds.   Pulmonary/Chest: Effort normal. No respiratory distress.  Musculoskeletal: Normal range of motion.  Neurological: She is alert and oriented to person, place, and time.  Skin: Skin is warm and dry.  Two well approximated lacs of right hand, one on pad of 3rd digit and one on lateral aspect of 4th digit  Psychiatric: She has a normal mood and affect. Her behavior is normal.  Nursing note and vitals reviewed.     Assessment & Plan:   Problem List Items Addressed This Visit    None    Visit Diagnoses    Laceration of right middle finger without foreign body without damage to nail, initial encounter    -  Primary    Both lacs irrigated with saline, gauze pressure dressing applied to slow the bleeding. Dermabond and steri strips applied. Wound care discussed. Tylenol or ibuprofen prn for pain control. Discussed return precautions.    Follow up plan: Return if symptoms worsen or fail to improve.

## 2016-09-23 ENCOUNTER — Emergency Department: Payer: Medicare HMO

## 2016-09-23 ENCOUNTER — Emergency Department
Admission: EM | Admit: 2016-09-23 | Discharge: 2016-09-23 | Disposition: A | Discharge status: Home / Self Care | Payer: Medicare HMO | Attending: Emergency Medicine | Admitting: Emergency Medicine

## 2016-09-23 DIAGNOSIS — R1030 Lower abdominal pain, unspecified: Secondary | ICD-10-CM

## 2016-09-23 DIAGNOSIS — N184 Chronic kidney disease, stage 4 (severe): Secondary | ICD-10-CM | POA: Insufficient documentation

## 2016-09-23 DIAGNOSIS — R109 Unspecified abdominal pain: Secondary | ICD-10-CM | POA: Diagnosis not present

## 2016-09-23 DIAGNOSIS — E039 Hypothyroidism, unspecified: Secondary | ICD-10-CM | POA: Insufficient documentation

## 2016-09-23 DIAGNOSIS — I129 Hypertensive chronic kidney disease with stage 1 through stage 4 chronic kidney disease, or unspecified chronic kidney disease: Secondary | ICD-10-CM | POA: Diagnosis not present

## 2016-09-23 DIAGNOSIS — R1032 Left lower quadrant pain: Secondary | ICD-10-CM | POA: Insufficient documentation

## 2016-09-23 DIAGNOSIS — Z79899 Other long term (current) drug therapy: Secondary | ICD-10-CM | POA: Diagnosis not present

## 2016-09-23 LAB — URINALYSIS, COMPLETE (UACMP) WITH MICROSCOPIC
Bilirubin Urine: NEGATIVE
GLUCOSE, UA: NEGATIVE mg/dL
HGB URINE DIPSTICK: NEGATIVE
KETONES UR: 5 mg/dL — AB
LEUKOCYTES UA: NEGATIVE
NITRITE: NEGATIVE
PH: 7 (ref 5.0–8.0)
Protein, ur: 30 mg/dL — AB
Specific Gravity, Urine: 1.003 — ABNORMAL LOW (ref 1.005–1.030)

## 2016-09-23 LAB — COMPREHENSIVE METABOLIC PANEL
ALK PHOS: 58 U/L (ref 38–126)
ALT: 17 U/L (ref 14–54)
AST: 21 U/L (ref 15–41)
Albumin: 4.1 g/dL (ref 3.5–5.0)
Anion gap: 10 (ref 5–15)
BILIRUBIN TOTAL: 0.8 mg/dL (ref 0.3–1.2)
BUN: 15 mg/dL (ref 6–20)
CO2: 19 mmol/L — ABNORMAL LOW (ref 22–32)
CREATININE: 1.48 mg/dL — AB (ref 0.44–1.00)
Calcium: 9.2 mg/dL (ref 8.9–10.3)
Chloride: 98 mmol/L — ABNORMAL LOW (ref 101–111)
GFR, EST AFRICAN AMERICAN: 37 mL/min — AB (ref >60)
GFR, EST NON AFRICAN AMERICAN: 32 mL/min — AB (ref >60)
Glucose, Bld: 118 mg/dL — ABNORMAL HIGH (ref 65–99)
Potassium: 4.4 mmol/L (ref 3.5–5.1)
SODIUM: 127 mmol/L — AB (ref 135–145)
TOTAL PROTEIN: 7 g/dL (ref 6.5–8.1)

## 2016-09-23 LAB — CBC
HCT: 39.9 % (ref 35.0–47.0)
Hemoglobin: 13.3 g/dL (ref 12.0–16.0)
MCH: 30.7 pg (ref 26.0–34.0)
MCHC: 33.5 g/dL (ref 32.0–36.0)
MCV: 91.9 fL (ref 80.0–100.0)
PLATELETS: 261 10*3/uL (ref 150–440)
RBC: 4.34 MIL/uL (ref 3.80–5.20)
RDW: 13.6 % (ref 11.5–14.5)
WBC: 6.3 10*3/uL (ref 3.6–11.0)

## 2016-09-23 LAB — LIPASE, BLOOD: LIPASE: 29 U/L (ref 11–51)

## 2016-09-23 MED ORDER — SODIUM CHLORIDE 0.9 % IV BOLUS (SEPSIS): 1000.0000 mL | Freq: Once | INTRAVENOUS | Status: DC

## 2016-09-23 MED ORDER — IOPAMIDOL (ISOVUE-300) INJECTION 61%
30.0000 mL | Freq: Once | INTRAVENOUS | Status: AC | PRN
  Administered 2016-09-23: 30 mL via ORAL

## 2016-09-23 NOTE — ED Notes (Signed)
FIRST NURSE NOTE: Checked in with patient, discussed wait and the plan for several patients to be discharged and to get her into a room next.

## 2016-09-23 NOTE — ED Provider Notes (Signed)
No specific etiology for the patient's symptoms are identified. We will send a urine culture due to a small amount of gas being visualized on CT. She has not had a catheter urine specimen performed. She is stable for outpatient follow-up.   Earleen Newport, MD 09/23/16 [phone]

## 2016-09-23 NOTE — ED Provider Notes (Signed)
[facility] Emergency Department Provider Note  ____________________________________________   I have reviewed the triage vital signs and the nursing notes.   HISTORY  Chief Complaint Abdominal Pain   History limited by: Not Limited   HPI Karla Little is a 83 y.o. female who presents to the emergency department today because of concerns for recurrent left lower quadrant pain. The patient was seen 10 days ago in the emergency department and diagnosed with diverticulitis. She was prescribed oral antibiotics. She states that after a couple of days she had improved however the patient states that 2 days ago she had an episode of sharp pain on her left side and since then has had an increase in pain on the left side. She has had some associated nausea but no vomiting. She denies any diarrhea. She denies any fevers.   Past Medical History:  Diagnosis Date  . Chronic kidney disease   . Hyperlipidemia   . Hypertension   . Hypothyroidism   . Osteoporosis   . Renal insufficiency   . Vertigo     Patient Active Problem List   Diagnosis Date Noted  . Atrophy of right kidney 03/16/2015  . Chronic kidney disease, stage 4, severely decreased GFR (HCC) 03/16/2015  . Shoulder bursitis 03/16/2015  . Hypothyroidism   . Hyperlipidemia   . Hypertension     Past Surgical History:  Procedure Laterality Date  . BREAST BIOPSY    . COLONOSCOPY W/ POLYPECTOMY    . COLONOSCOPY WITH PROPOFOL N/A 06/02/2015   Procedure: COLONOSCOPY WITH PROPOFOL;  Surgeon: Manya Silvas, MD;  Location: [facility] ENDOSCOPY;  Service: Endoscopy;  Laterality: N/A;    Prior to Admission medications   Medication Sig Start Date End Date Taking? Authorizing Provider  amLODipine (NORVASC) 10 MG tablet Take 1 tablet (10 mg total) by mouth daily. 06/22/16   [name], MD  amoxicillin-clavulanate (AUGMENTIN) 875-125 MG tablet Take 1 tablet by mouth 2 (two) times daily. 09/13/16 09/27/16  Darel Hong, MD  calcitRIOL (ROCALTROL) 0.25 MCG capsule Take 0.25 mcg by mouth. 11/24/15   Historical Provider, MD  Calcium Citrate-Vitamin D (CALCIUM + D PO) Take by mouth daily.    Historical Provider, MD  hydrALAZINE (APRESOLINE) 25 MG tablet Take 2 tablets (50 mg total) by mouth daily. 06/22/16   [name], MD  levothyroxine (SYNTHROID, LEVOTHROID) 50 MCG tablet Take 1 tablet (50 mcg total) by mouth daily. 06/22/16   [name], MD  lisinopril (PRINIVIL,ZESTRIL) 10 MG tablet Take 1 tablet (10 mg total) by mouth daily. 06/22/16   [name], MD  metroNIDAZOLE (FLAGYL) 500 MG tablet Take 1 tablet (500 mg total) by mouth 3 (three) times daily. 09/13/16 09/27/16  Darel Hong, MD  nitroGLYCERIN (NITROSTAT) 0.4 MG SL tablet Place 0.4 mg under the tongue every 5 (five) minutes as needed for chest pain.    Historical Provider, MD  ondansetron (ZOFRAN) 4 MG tablet Take 1 tablet (4 mg total) by mouth daily as needed for nausea or vomiting. 09/13/16 09/13/17  Darel Hong, MD  oxyCODONE-acetaminophen (ROXICET) 5-325 MG tablet Take 1 tablet by mouth every 6 (six) hours as needed. 09/13/16   Darel Hong, MD  sodium bicarbonate 650 MG tablet  02/25/15   Historical Provider, MD  vitamin B-12 (CYANOCOBALAMIN) 250 MCG tablet Take 250 mcg by mouth daily.    Historical Provider, MD  Vitamin D, Ergocalciferol, (DRISDOL) 50000 UNITS CAPS capsule Take 50,000 Units by mouth daily.  Historical Provider, MD  vitamin E 200 UNIT capsule Take 200 Units by mouth daily.    Historical Provider, MD    Allergies Patient has no known allergies.  Family History  Problem Relation Age of Onset  . Hypertension Mother   . Heart disease Father   . Cancer Sister     breast    Social History Social History  Substance Use Topics  . Smoking status: Never Smoker  . Smokeless tobacco: Never Used  . Alcohol use No    Review of Systems  Constitutional: Negative for fever. Cardiovascular: Negative for chest  pain. Respiratory: Negative for shortness of breath. Gastrointestinal: Positive for left lower quadrant abdominal pain. Genitourinary: Negative for dysuria. Musculoskeletal: Negative for back pain. Skin: Negative for rash. Neurological: Negative for headaches, focal weakness or numbness.  10-point ROS otherwise negative.  ____________________________________________   PHYSICAL EXAM:  VITAL SIGNS: ED Triage Vitals  Enc Vitals Group     BP 09/23/16 0954 (!) 127/59     Pulse Rate 09/23/16 0954 74     Resp 09/23/16 0954 18     Temp 09/23/16 0954 98.5 F (36.9 C)     Temp Source 09/23/16 0954 Oral     SpO2 09/23/16 0954 98 %     Weight 09/23/16 0955 180 lb (81.6 kg)     Height 09/23/16 0955 5\' 9"  (1.753 m)     Head Circumference --      Peak Flow --      Pain Score 09/23/16 1005 2   Constitutional: Alert and oriented. Well appearing and in no distress. Eyes: Conjunctivae are normal. Normal extraocular movements. ENT   Head: Normocephalic and atraumatic.   Nose: No congestion/rhinnorhea.   Mouth/Throat: Mucous membranes are moist.   Neck: No stridor. Hematological/Lymphatic/Immunilogical: No cervical lymphadenopathy. Cardiovascular: Normal rate, regular rhythm.  No murmurs, rubs, or gallops.  Respiratory: Normal respiratory effort without tachypnea nor retractions. Breath sounds are clear and equal bilaterally. No wheezes/rales/rhonchi. Gastrointestinal: Soft and non tender. No rebound. No guarding.  Genitourinary: Deferred Musculoskeletal: Normal range of motion in all extremities. No lower extremity edema. Neurologic:  Normal speech and language. No gross focal neurologic deficits are appreciated.  Skin:  Skin is warm, dry and intact. No rash noted. Psychiatric: Mood and affect are normal. Speech and behavior are normal. Patient exhibits appropriate insight and judgment.  ____________________________________________    LABS (pertinent  positives/negatives)  Labs Reviewed  COMPREHENSIVE METABOLIC PANEL - Abnormal; Notable for the following:       Result Value   Sodium 127 (*)    Chloride 98 (*)    CO2 19 (*)    Glucose, Bld 118 (*)    Creatinine, Ser 1.48 (*)    GFR calc non Af Amer 32 (*)    GFR calc Af Amer 37 (*)    All other components within normal limits  URINALYSIS, COMPLETE (UACMP) WITH MICROSCOPIC - Abnormal; Notable for the following:    Color, Urine STRAW (*)    APPearance CLEAR (*)    Specific Gravity, Urine 1.003 (*)    Ketones, ur 5 (*)    Protein, ur 30 (*)    Bacteria, UA RARE (*)    Squamous Epithelial / LPF 0-5 (*)    All other components within normal limits  URINE CULTURE  LIPASE, BLOOD  CBC     ____________________________________________   EKG  None  ____________________________________________    RADIOLOGY  CT abd/pel pending  ____________________________________________   PROCEDURES  Procedures  ____________________________________________   INITIAL IMPRESSION / ASSESSMENT AND PLAN / ED COURSE  Pertinent labs & imaging results that were available during my care of the patient were reviewed by me and considered in my medical decision making (see chart for details).  Patient presents to the emergency department today because of concerns for left lower quadrant pain. Given recent diagnosis of diverticulitis and recurrence of pain I do have concern for potential abscess or would think less likely perforation. Will plan on CT scanning.  ____________________________________________   FINAL CLINICAL IMPRESSION(S) / ED DIAGNOSES  Abdominal pain  Note: This dictation was prepared with Dragon dictation. Any transcriptional errors that result from this process are unintentional     Nance Pear, MD 09/24/16 1131

## 2016-09-23 NOTE — ED Triage Notes (Signed)
Pt c/o abd pain x2 days. On PO abx for diverticulitis diagnosed in ED x10 days ago. Remains on abx amoxicillin and flagyl. Reports pain x2 days ago dull in nature with sharp pain intermittently. Denies diarrhea.

## 2016-09-23 NOTE — ED Notes (Signed)
FIRST NURSE NOTE: LLQ abdominal pain, was here 10 days ago for the same dx with Diverticulitis and still taking prescriptions, but continues to have pain.  Did not follow up after discharge.

## 2016-09-24 ENCOUNTER — Telehealth: Payer: Self-pay

## 2016-09-24 NOTE — Telephone Encounter (Signed)
Received fax from [facility], about pt's abdominal pain. Pt presented to E.R. After calling here. Was told at E.R. That diverticulitis was clearing, and pain likely due to air in abdomen. Called pt today, she is still having some pain, but it is much better, she is able to get up and move around. Advised to call back if pain continues/worsens for O.V.

## 2016-09-27 LAB — URINE CULTURE: SPECIAL REQUESTS: NORMAL

## 2016-09-28 NOTE — Progress Notes (Signed)
83 y/o F d/c from ED 4/22 with abdominal pain recently treated for diverticulitis with Augmentin and Flagyl. Urine culture returned with Citrobacter and Pseudomonas. Dr. Carrie Mew authorized treatment with ciprofloxacin 250 mg bid x 7 days. After discussion with patient, prescription was called into [facility] in [place], PharmD Clinical Pharmacist

## 2016-10-10 DIAGNOSIS — N183 Chronic kidney disease, stage 3 (moderate): Secondary | ICD-10-CM | POA: Diagnosis not present

## 2016-10-10 DIAGNOSIS — N2581 Secondary hyperparathyroidism of renal origin: Secondary | ICD-10-CM | POA: Diagnosis not present

## 2016-10-10 DIAGNOSIS — H2513 Age-related nuclear cataract, bilateral: Secondary | ICD-10-CM | POA: Diagnosis not present

## 2016-10-10 DIAGNOSIS — E872 Acidosis: Secondary | ICD-10-CM | POA: Diagnosis not present

## 2016-10-10 DIAGNOSIS — I10 Essential (primary) hypertension: Secondary | ICD-10-CM | POA: Diagnosis not present

## 2016-10-10 DIAGNOSIS — H401130 Primary open-angle glaucoma, bilateral, stage unspecified: Secondary | ICD-10-CM | POA: Diagnosis not present

## 2017-01-16 DIAGNOSIS — E872 Acidosis: Secondary | ICD-10-CM | POA: Diagnosis not present

## 2017-01-16 DIAGNOSIS — I10 Essential (primary) hypertension: Secondary | ICD-10-CM | POA: Diagnosis not present

## 2017-01-16 DIAGNOSIS — N184 Chronic kidney disease, stage 4 (severe): Secondary | ICD-10-CM | POA: Diagnosis not present

## 2017-01-16 DIAGNOSIS — N2581 Secondary hyperparathyroidism of renal origin: Secondary | ICD-10-CM | POA: Diagnosis not present

## 2017-01-21 DIAGNOSIS — I10 Essential (primary) hypertension: Secondary | ICD-10-CM | POA: Diagnosis not present

## 2017-01-21 DIAGNOSIS — N183 Chronic kidney disease, stage 3 (moderate): Secondary | ICD-10-CM | POA: Diagnosis not present

## 2017-01-21 DIAGNOSIS — E872 Acidosis: Secondary | ICD-10-CM | POA: Diagnosis not present

## 2017-01-21 DIAGNOSIS — N2581 Secondary hyperparathyroidism of renal origin: Secondary | ICD-10-CM | POA: Diagnosis not present

## 2017-02-07 DIAGNOSIS — H25041 Posterior subcapsular polar age-related cataract, right eye: Secondary | ICD-10-CM | POA: Diagnosis not present

## 2017-02-07 DIAGNOSIS — H2511 Age-related nuclear cataract, right eye: Secondary | ICD-10-CM | POA: Diagnosis not present

## 2017-02-07 DIAGNOSIS — H2513 Age-related nuclear cataract, bilateral: Secondary | ICD-10-CM | POA: Diagnosis not present

## 2017-02-07 DIAGNOSIS — H25013 Cortical age-related cataract, bilateral: Secondary | ICD-10-CM | POA: Diagnosis not present

## 2017-02-07 DIAGNOSIS — H40013 Open angle with borderline findings, low risk, bilateral: Secondary | ICD-10-CM | POA: Diagnosis not present

## 2017-02-07 DIAGNOSIS — H25011 Cortical age-related cataract, right eye: Secondary | ICD-10-CM | POA: Diagnosis not present

## 2017-02-07 DIAGNOSIS — H25043 Posterior subcapsular polar age-related cataract, bilateral: Secondary | ICD-10-CM | POA: Diagnosis not present

## 2017-02-19 DIAGNOSIS — H2511 Age-related nuclear cataract, right eye: Secondary | ICD-10-CM | POA: Diagnosis not present

## 2017-02-19 DIAGNOSIS — H25811 Combined forms of age-related cataract, right eye: Secondary | ICD-10-CM | POA: Diagnosis not present

## 2017-03-07 ENCOUNTER — Ambulatory Visit (INDEPENDENT_AMBULATORY_CARE_PROVIDER_SITE_OTHER): Payer: Medicare HMO

## 2017-03-07 DIAGNOSIS — Z23 Encounter for immunization: Secondary | ICD-10-CM

## 2017-03-07 NOTE — Patient Instructions (Addendum)

## 2017-06-13 DIAGNOSIS — I10 Essential (primary) hypertension: Secondary | ICD-10-CM | POA: Diagnosis not present

## 2017-06-13 DIAGNOSIS — N2581 Secondary hyperparathyroidism of renal origin: Secondary | ICD-10-CM | POA: Diagnosis not present

## 2017-06-13 DIAGNOSIS — E872 Acidosis: Secondary | ICD-10-CM | POA: Diagnosis not present

## 2017-06-13 DIAGNOSIS — N183 Chronic kidney disease, stage 3 (moderate): Secondary | ICD-10-CM | POA: Diagnosis not present

## 2017-06-14 LAB — CBC AND DIFFERENTIAL
HEMATOCRIT: 39 (ref 36–46)
HEMOGLOBIN: 13.2 (ref 12.0–16.0)
NEUTROS ABS: 3
PLATELETS: 341 (ref 150–399)
WBC: 4.9

## 2017-06-14 LAB — BASIC METABOLIC PANEL
BUN: 25 — AB (ref 4–21)
Creatinine: 1.5 — AB (ref 0.5–1.1)
Glucose: 97
Potassium: 5 (ref 3.4–5.3)
Sodium: 139 (ref 137–147)

## 2017-06-14 LAB — HEPATIC FUNCTION PANEL
ALK PHOS: 66 (ref 25–125)
ALT: 10 (ref 7–35)
AST: 13 (ref 13–35)
Bilirubin, Total: 0.4

## 2017-06-24 ENCOUNTER — Encounter: Payer: Self-pay | Admitting: Family Medicine

## 2017-06-24 ENCOUNTER — Ambulatory Visit (INDEPENDENT_AMBULATORY_CARE_PROVIDER_SITE_OTHER): Payer: Medicare HMO | Admitting: Family Medicine

## 2017-06-24 VITALS — BP 154/78 | HR 76 | Ht 68.9 in | Wt 178.0 lb

## 2017-06-24 DIAGNOSIS — E039 Hypothyroidism, unspecified: Secondary | ICD-10-CM | POA: Diagnosis not present

## 2017-06-24 DIAGNOSIS — I129 Hypertensive chronic kidney disease with stage 1 through stage 4 chronic kidney disease, or unspecified chronic kidney disease: Secondary | ICD-10-CM

## 2017-06-24 DIAGNOSIS — E785 Hyperlipidemia, unspecified: Secondary | ICD-10-CM | POA: Diagnosis not present

## 2017-06-24 DIAGNOSIS — N184 Chronic kidney disease, stage 4 (severe): Secondary | ICD-10-CM

## 2017-06-24 DIAGNOSIS — I10 Essential (primary) hypertension: Secondary | ICD-10-CM

## 2017-06-24 DIAGNOSIS — Z0000 Encounter for general adult medical examination without abnormal findings: Secondary | ICD-10-CM | POA: Diagnosis not present

## 2017-06-24 DIAGNOSIS — Z7189 Other specified counseling: Secondary | ICD-10-CM | POA: Insufficient documentation

## 2017-06-24 LAB — MICROSCOPIC EXAMINATION: RBC, UA: NONE SEEN /hpf (ref 0–?)

## 2017-06-24 LAB — URINALYSIS, ROUTINE W REFLEX MICROSCOPIC
BILIRUBIN UA: NEGATIVE
Glucose, UA: NEGATIVE
Ketones, UA: NEGATIVE
Nitrite, UA: NEGATIVE
PH UA: 6 (ref 5.0–7.5)
RBC UA: NEGATIVE
Specific Gravity, UA: 1.015 (ref 1.005–1.030)
UUROB: 0.2 mg/dL (ref 0.2–1.0)

## 2017-06-24 MED ORDER — HYDRALAZINE HCL 25 MG PO TABS: 50.0000 mg | ORAL_TABLET | Freq: Every day | ORAL | 4 refills | Status: DC

## 2017-06-24 MED ORDER — LEVOTHYROXINE SODIUM 50 MCG PO TABS: 50.0000 ug | ORAL_TABLET | Freq: Every day | ORAL | 4 refills | Status: DC

## 2017-06-24 MED ORDER — LISINOPRIL 10 MG PO TABS: 10.0000 mg | ORAL_TABLET | Freq: Every day | ORAL | 4 refills | Status: DC

## 2017-06-24 MED ORDER — AMLODIPINE BESYLATE 10 MG PO TABS: 10.0000 mg | ORAL_TABLET | Freq: Every day | ORAL | 4 refills | Status: DC

## 2017-06-24 NOTE — Assessment & Plan Note (Signed)
The current medical regimen is effective;  continue present plan and medications.  

## 2017-06-24 NOTE — Progress Notes (Signed)
BP (!) 154/78   Pulse 76   Ht 5' 8.9" (1.75 m)   Wt 178 lb (80.7 kg)   SpO2 99%   BMI 26.36 kg/m    Subjective:    Patient ID: Karla Little, female    DOB: [birth year], 84 y.o.   MRN: [id number]  HPI: Karla Little is a 84 y.o. female  Chief Complaint  Patient presents with  . Annual Exam  Patient follow-up getting good reports from nephrology with good stable blood pressures.  Blood pressure elevated here today patient took her medicines a little while ago.  Otherwise doing well with good control.  No issues with thyroid B12. On review patient's renal function has stayed stable for years. Patient also notes that occasionally is a little wobbly on her feet no falling. Relevant past medical, surgical, family and social history reviewed and updated as indicated. Interim medical history since our last visit reviewed. Allergies and medications reviewed and updated.  Review of Systems  Constitutional: Negative.   HENT: Negative.   Eyes: Negative.   Respiratory: Negative.   Cardiovascular: Negative.   Gastrointestinal: Negative.   Endocrine: Negative.   Genitourinary: Negative.   Musculoskeletal: Negative.   Skin: Negative.   Allergic/Immunologic: Negative.   Neurological: Negative.   Hematological: Negative.   Psychiatric/Behavioral: Negative.     Per HPI unless specifically indicated above     Objective:    BP (!) 154/78   Pulse 76   Ht 5' 8.9" (1.75 m)   Wt 178 lb (80.7 kg)   SpO2 99%   BMI 26.36 kg/m   Wt Readings from Last 3 Encounters:  06/24/17 178 lb (80.7 kg)  09/23/16 180 lb (81.6 kg)  09/21/16 190 lb (86.2 kg)    Physical Exam  Constitutional: She is oriented to person, place, and time. She appears well-developed and well-nourished.  HENT:  Head: Normocephalic and atraumatic.  Right Ear: External ear normal.  Left Ear: External ear normal.  Nose: Nose normal.  Mouth/Throat: Oropharynx is clear and moist.  Eyes: Conjunctivae and EOM are  normal. Pupils are equal, round, and reactive to light.  Neck: Normal range of motion. Neck supple. Carotid bruit is not present.  Cardiovascular: Normal rate, regular rhythm and normal heart sounds.  No murmur heard. Pulmonary/Chest: Effort normal and breath sounds normal. She exhibits no mass. Right breast exhibits no mass, no skin change and no tenderness. Left breast exhibits no mass, no skin change and no tenderness. Breasts are symmetrical.  Abdominal: Soft. Bowel sounds are normal. There is no hepatosplenomegaly.  Musculoskeletal: Normal range of motion.  Neurological: She is alert and oriented to person, place, and time.  Skin: No rash noted.  Psychiatric: She has a normal mood and affect. Her behavior is normal. Judgment and thought content normal.    Results for orders placed or performed in visit on 06/24/17  CBC and differential  Result Value Ref Range   Hemoglobin 13.2 12.0 - 16.0   HCT 39 36 - 46   Neutrophils Absolute 3    Platelets 341 150 - 399   WBC 4.9   Basic metabolic panel  Result Value Ref Range   Glucose 97    BUN 25 (A) 4 - 21   Creatinine 1.5 (A) 0.5 - 1.1   Potassium 5.0 3.4 - 5.3   Sodium 139 137 - 147  Hepatic function panel  Result Value Ref Range   Alkaline Phosphatase 66 25 - 125   ALT 10  7 - 35   AST 13 13 - 35   Bilirubin, Total 0.4       Assessment & Plan:   Problem List Items Addressed This Visit      Cardiovascular and Mediastinum   Hypertension - Primary    The current medical regimen is effective;  continue present plan and medications.       Relevant Orders   Lipid panel   Urinalysis, Routine w reflex microscopic     Endocrine   Hypothyroidism    The current medical regimen is effective;  continue present plan and medications.       Relevant Orders   TSH     Genitourinary   Chronic kidney disease, stage 4, severely decreased GFR (HCC)    The current medical regimen is effective;  continue present plan and  medications.       Relevant Orders   Lipid panel   Urinalysis, Routine w reflex microscopic     Other   Hyperlipidemia    The current medical regimen is effective;  continue present plan and medications.       Relevant Orders   Lipid panel   Urinalysis, Routine w reflex microscopic   Advanced care planning/counseling discussion    A voluntary discussion about advance care planning including the explanation and discussion of advance directives was extensively discussed  with the patient.  Explanation about the health care proxy and Living will was reviewed and packet with forms with explanation of how to fill them out was given.       Other Visit Diagnoses    PE (physical exam), annual           Follow up plan: Return in about 6 months (around 12/22/2017).

## 2017-06-24 NOTE — Assessment & Plan Note (Signed)
A voluntary discussion about advance care planning including the explanation and discussion of advance directives was extensively discussed  with the patient.  Explanation about the health care proxy and Living will was reviewed and packet with forms with explanation of how to fill them out was given.    

## 2017-06-25 LAB — LIPID PANEL
CHOL/HDL RATIO: 3.5 ratio (ref 0.0–4.4)
Cholesterol, Total: 249 mg/dL — ABNORMAL HIGH (ref 100–199)
HDL: 71 mg/dL (ref >39)
LDL CALC: 147 mg/dL — AB (ref 0–99)
Triglycerides: 154 mg/dL — ABNORMAL HIGH (ref 0–149)
VLDL Cholesterol Cal: 31 mg/dL (ref 5–40)

## 2017-06-25 LAB — TSH: TSH: 3.75 u[IU]/mL (ref 0.450–4.500)

## 2017-06-26 ENCOUNTER — Telehealth: Payer: Self-pay | Admitting: Family Medicine

## 2017-06-26 NOTE — Telephone Encounter (Signed)
-----   Message from Georgina Peer, [place] sent at 06/26/2017  4:46 PM EST ----- Phone call.

## 2017-08-13 IMAGING — CT CT ABD-PELV W/ CM
2 of 5 series · 15 of 46 positions shown, 17 images · IV contrast (iopamidol)
Comparison: October 06, 2010

CLINICAL DATA: Abdominal pain, primarily left lower quadrant

EXAM:
CT ABDOMEN AND PELVIS WITH CONTRAST
TECHNIQUE: Multidetector CT imaging of the abdomen and pelvis was performed
using the standard protocol following bolus administration of
intravenous contrast.
CONTRAST:  75mL [id number]-LHH IOPAMIDOL ([id number]-LHH) INJECTION 61%

[Series 2: routine abd/pel with · axial · 0.88mm/px · z∈[-901,-481]mm · 12 of 94 slices shown, 14 images]
[im 5/94  soft-tissue]
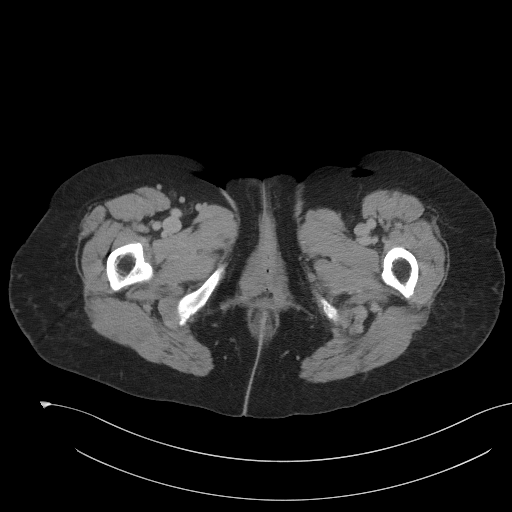
[im 5/94  bone]
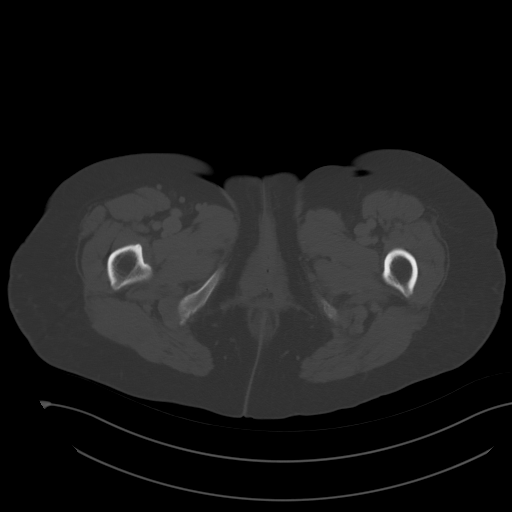
[im 15/94  soft-tissue]
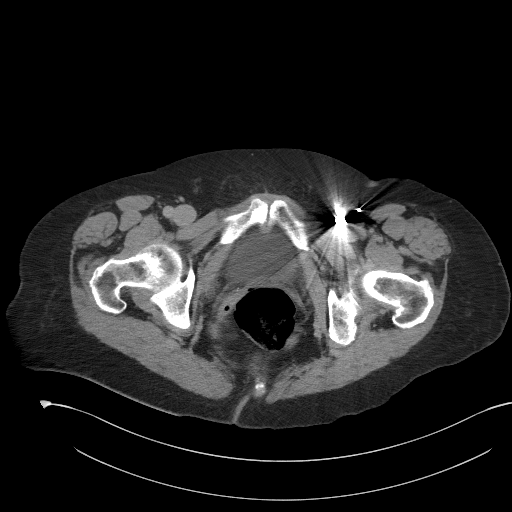
[im 20/94  soft-tissue]
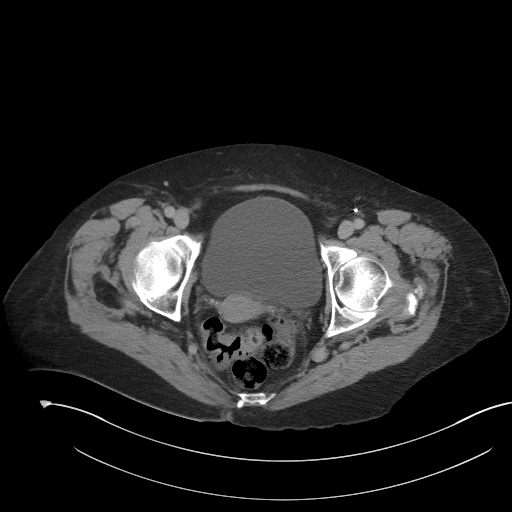
[im 30/94  soft-tissue]
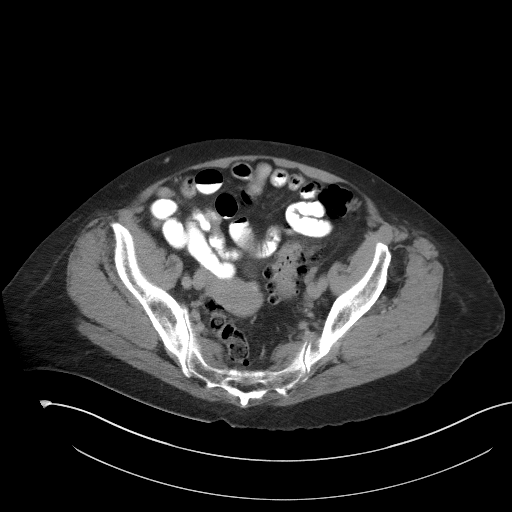
[im 35/94  soft-tissue]
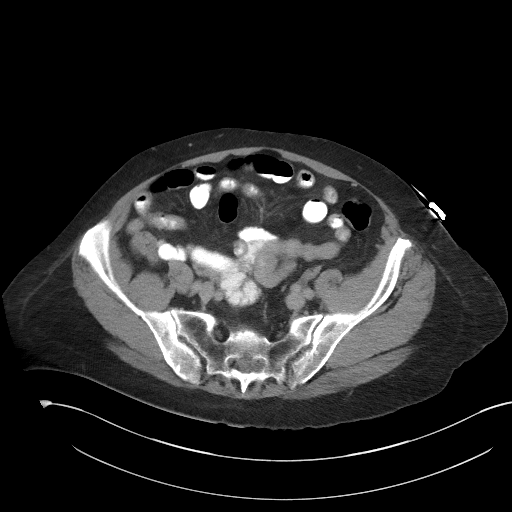
[im 45/94  soft-tissue]
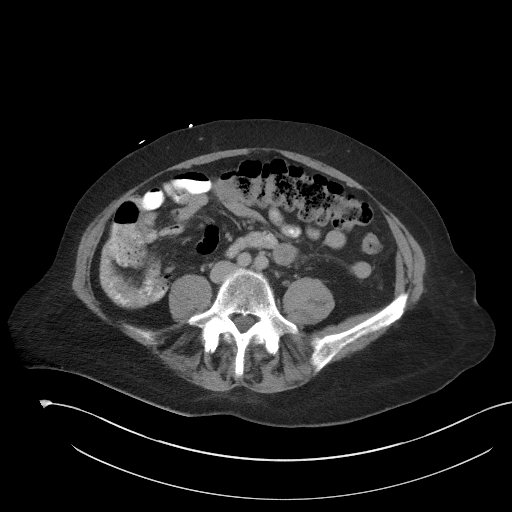
[im 49/94  soft-tissue]
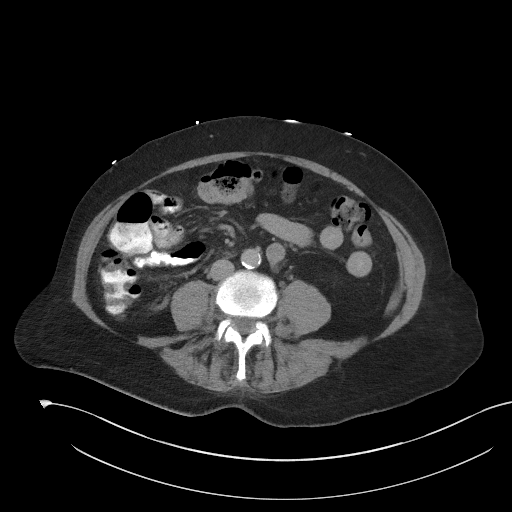
[im 59/94  soft-tissue]
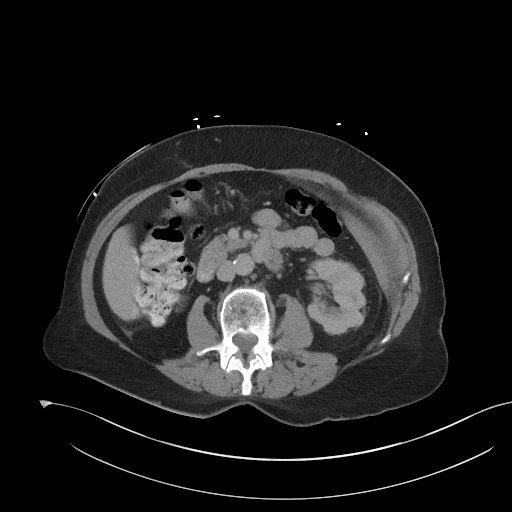
[im 64/94  soft-tissue]
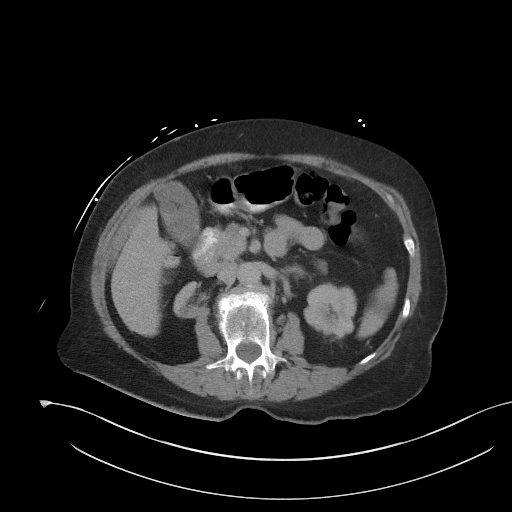
[im 64/94  bone]
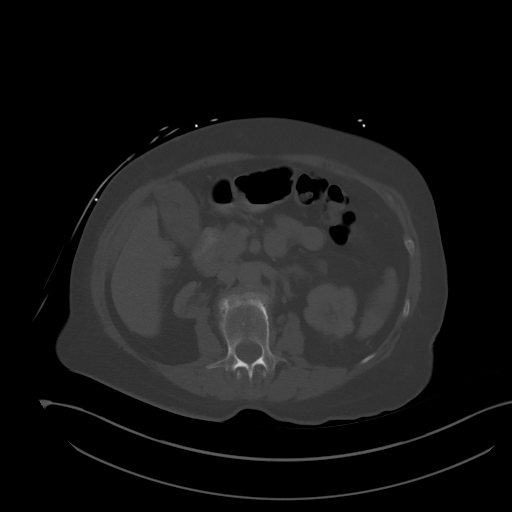
[im 74/94  soft-tissue]
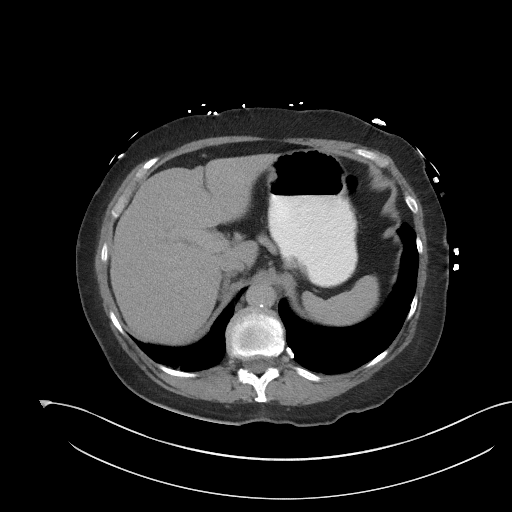
[im 79/94  soft-tissue]
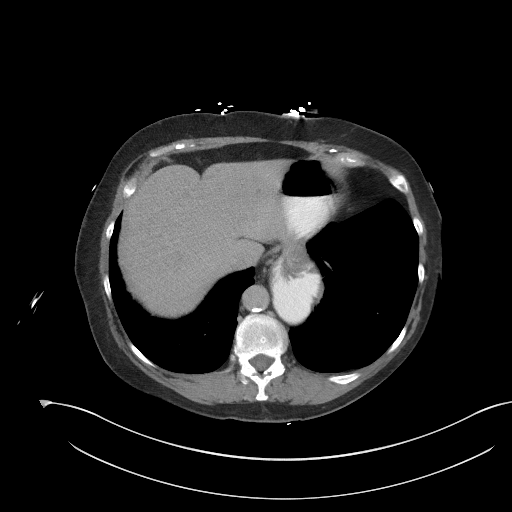
[im 89/94  soft-tissue]
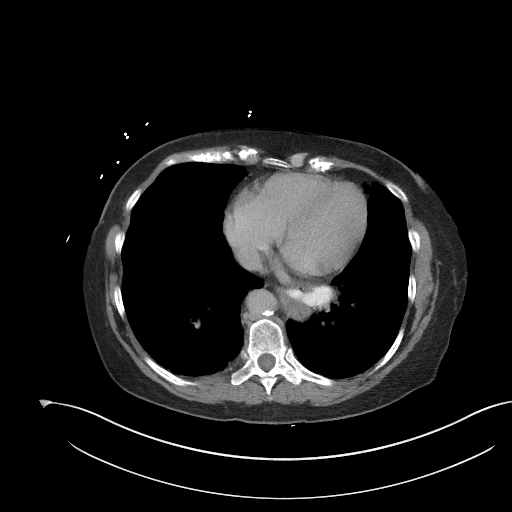

[Series 5: coronal st · coronal · 0.82mm/px · 3 of 89 slices shown]
[im 30/89  soft-tissue]
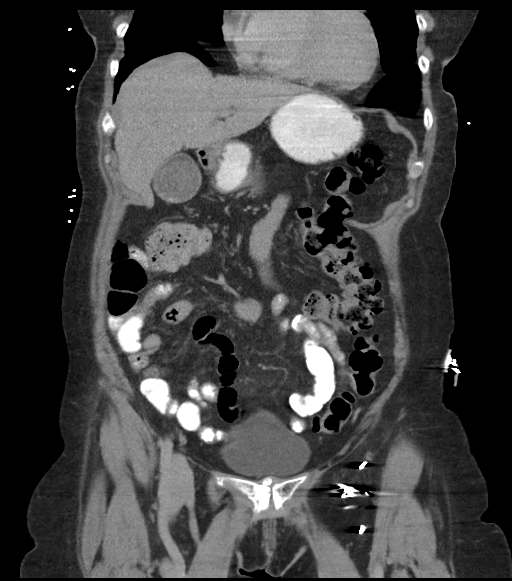
[im 40/89  soft-tissue]
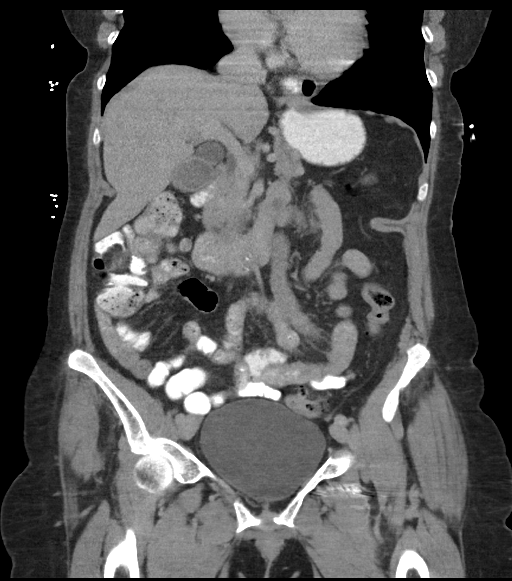
[im 49/89  soft-tissue]
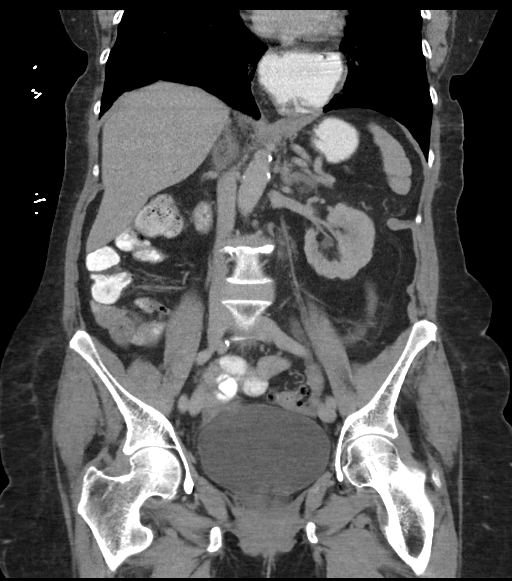

[15 of 46 positions shown; findings below may reference images not displayed]

FINDINGS: Lower chest: There is mild bibasilar lung atelectasis. There is a
nodular opacity abutting the pleura in the superior segment of the
left lower lobe measuring 4 mm. There is a moderate hiatal hernia.

Hepatobiliary: There is a cyst in the posterior segment right lobe
measuring 8 x 8 mm. No other focal liver lesions are evident. Within
the gallbladder, there are multiple nitrogen containing gallstones.
The gallbladder wall appears mildly thickened. There is no
pericholecystic fluid evident by CT. There is no biliary duct
dilatation.

Pancreas: There is no appreciable pancreatic mass or inflammatory
focus.

Spleen: There is a small calcified splenic granuloma. Spleen
otherwise appears unremarkable.

Adrenals/Urinary Tract: Adrenal glands appear normal bilaterally.
The right kidney is atrophic. Left kidney shows fetal lobulations as
well as an area of scarring in the posterior upper left kidney.
There is no appreciable renal mass or hydronephrosis on either side.
There is no appreciable ureteral or renal calculus on either side.
Re- bladder is midline with wall thickness within normal limits.

Stomach/Bowel: There are multiple sigmoid diverticula. There is wall
thickening in the sigmoid colon region with subtle mesenteric
thickening in this area, likely early changes of diverticulitis.
There is no abscess or perforation in this area. No similar changes
are seen elsewhere in the bowel. Elsewhere there is no bowel wall or
mesenteric thickening. There is no evident bowel obstruction. No
free air or portal venous air.

Vascular/Lymphatic: There is atherosclerotic calcification in the
aorta without aneurysm. Major mesenteric vessels appear patent on
this study. There is no adenopathy in the abdomen or pelvis.

Reproductive: Uterus is anteverted. No pelvic mass evident. A few
tiny calcifications in the uterus are probably vascular, although
tiny calcified leiomyomas could present in this manner.

Other: Appendix appears normal. No abscess or ascites evident in the
abdomen or pelvis. There are clips in the left inguinal region. No
hernia is currently evident in this area.

Musculoskeletal: There is degenerative change in the lumbar spine
and pubic symphysis regions. There are no blastic or lytic bone
lesions.
IMPRESSION: Sigmoid diverticulosis with wall thickening and subtle mesenteric
stranding in portions of this area. Suspect earliest changes of
diverticulitis. No abscess or perforation.

No similar appearing bowel inflammation elsewhere. No bowel
obstruction. No abscess elsewhere in the abdomen or pelvis. Appendix
appears normal.

Gallstones with subtle wall thickening of the gallbladder. Concern
for a degree of acute cholecystitis.

Atrophic right kidney. Scarring left kidney. No hydronephrosis on
either side. No renal or ureteral calculus on either side.

Aortic atherosclerosis.

Or mm nodular opacity posterior left base. No follow-up needed if
patient is low-risk. Non-contrast chest CT can be considered in 12
months if patient is high-risk. This recommendation follows the
consensus statement: Guidelines for Management of Incidental
Pulmonary Nodules Detected on CT Images: From the [HOSPITAL]

## 2017-08-23 IMAGING — CT CT ABD-PELV W/O CM
2 of 4 series · 16 of 46 positions shown, 18 images · non-contrast
Comparison: CT the abdomen and pelvis 09/13/2016.

CLINICAL DATA: 83-year-old female with history of left lower
quadrant abdominal pain. Treated for diverticulitis 10 days ago,
with persistent pain today.

EXAM:
CT ABDOMEN AND PELVIS WITHOUT CONTRAST
TECHNIQUE: Multidetector CT imaging of the abdomen and pelvis was performed
following the standard protocol without IV contrast.

[Series 2: routine abd/pel wo · axial · 0.85mm/px · z∈[-855,-450]mm · 13 of 89 slices shown, 15 images]
[im 4/89  soft-tissue]
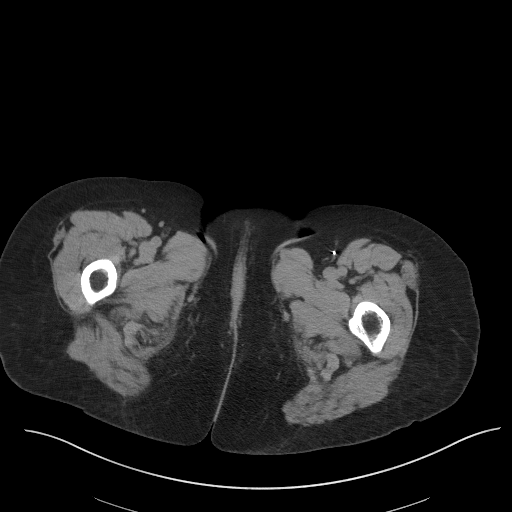
[im 4/89  bone]
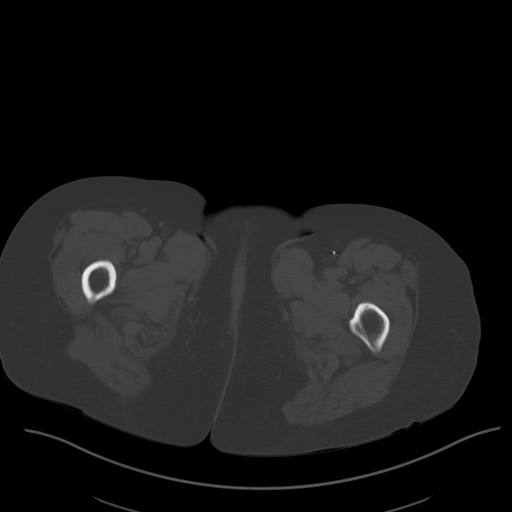
[im 12/89  soft-tissue]
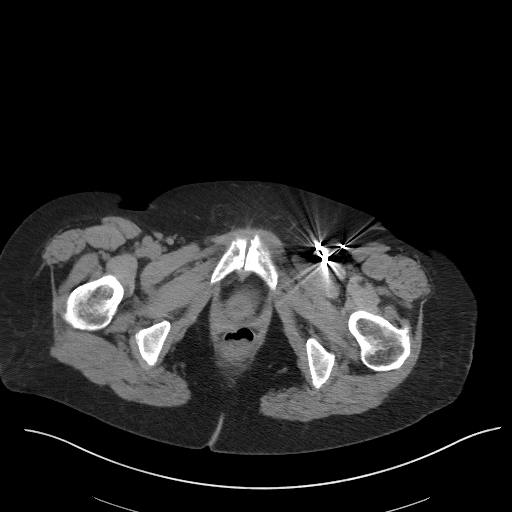
[im 19/89  soft-tissue]
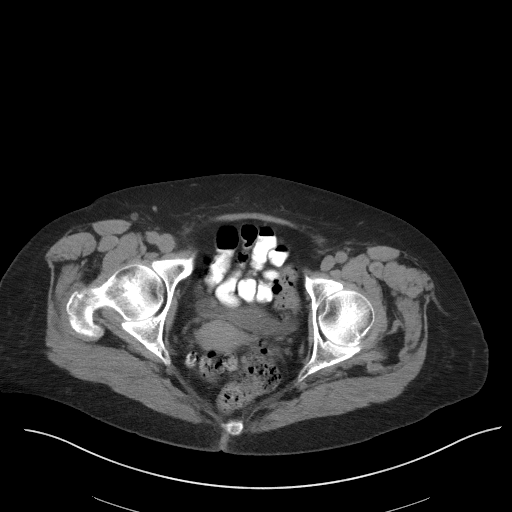
[im 26/89  soft-tissue]
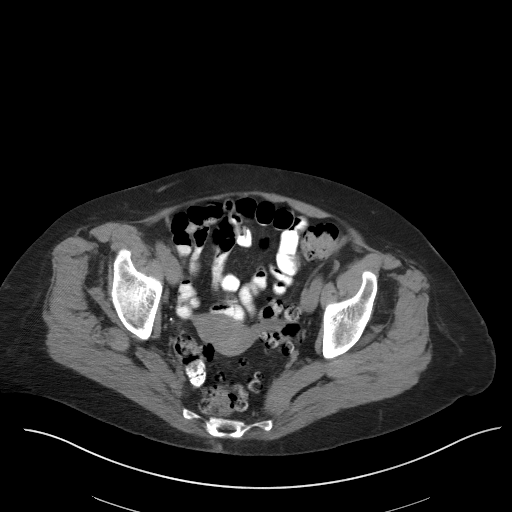
[im 30/89  soft-tissue]
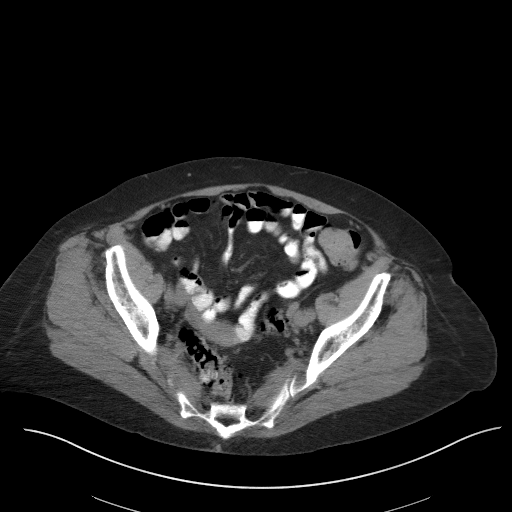
[im 37/89  soft-tissue]
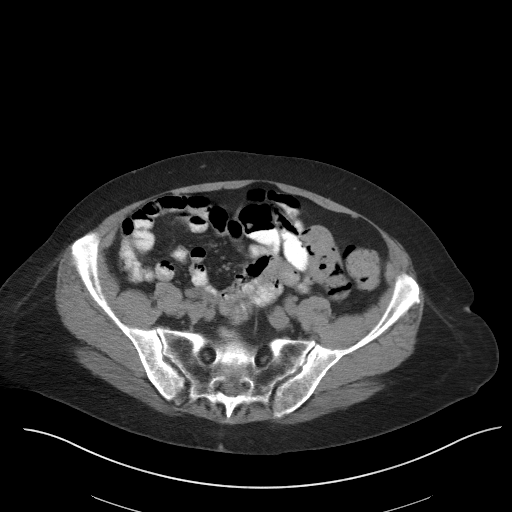
[im 45/89  soft-tissue]
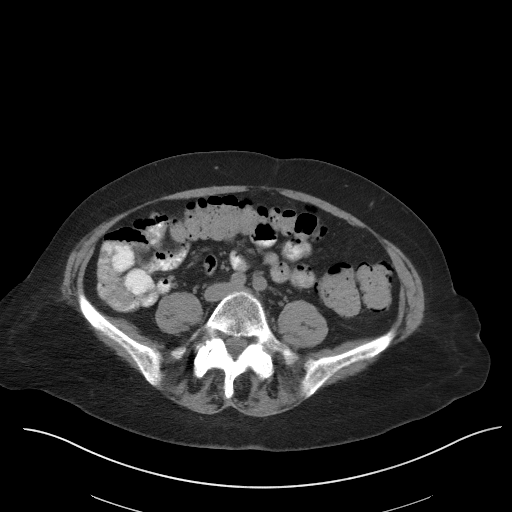
[im 52/89  soft-tissue]
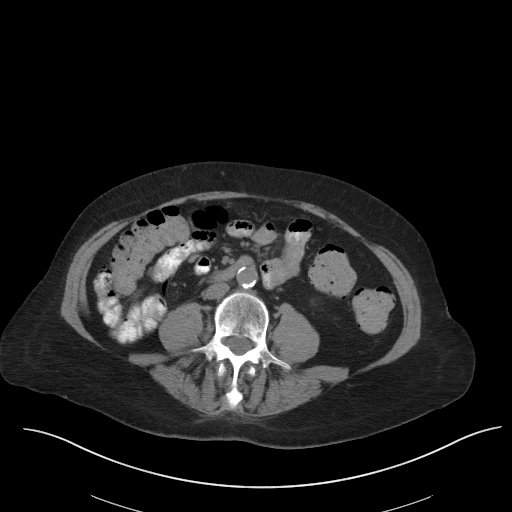
[im 59/89  soft-tissue]
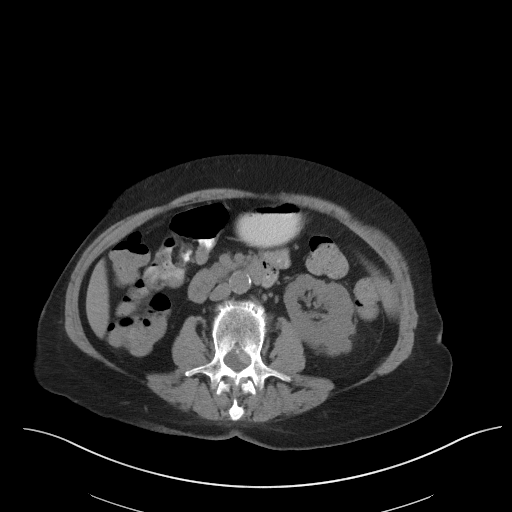
[im 59/89  bone]
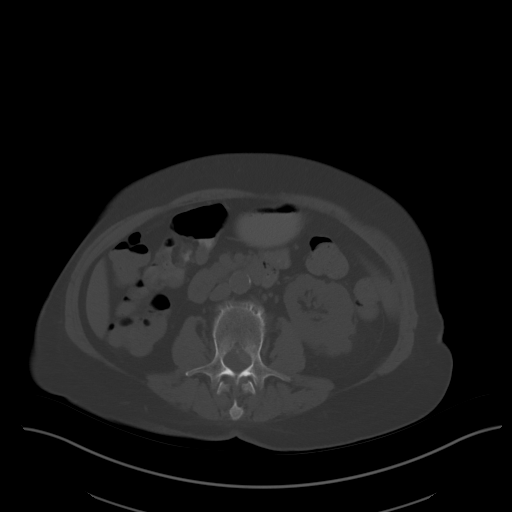
[im 63/89  soft-tissue]
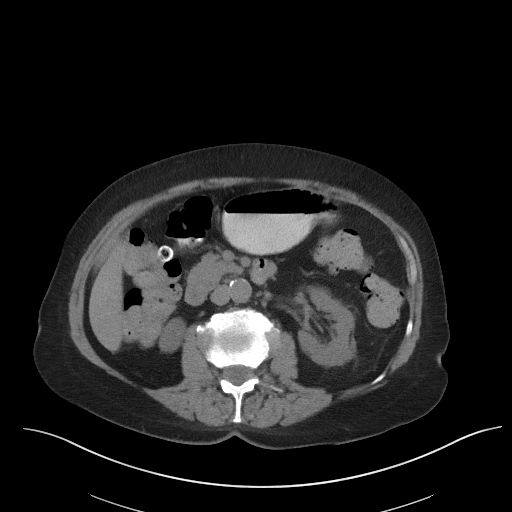
[im 70/89  soft-tissue]
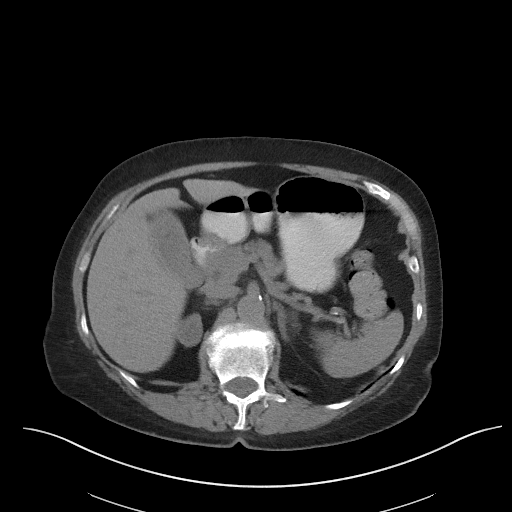
[im 78/89  soft-tissue]
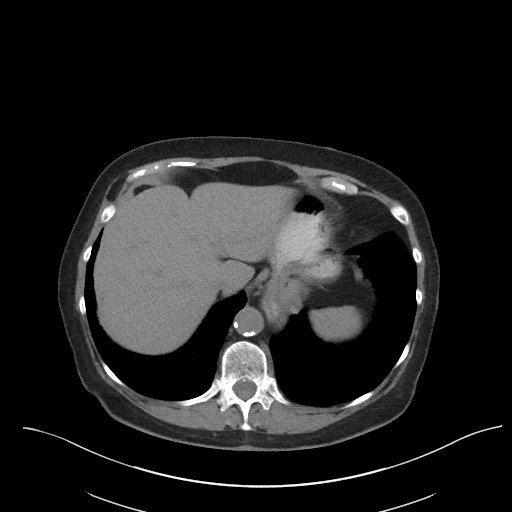
[im 85/89  soft-tissue]
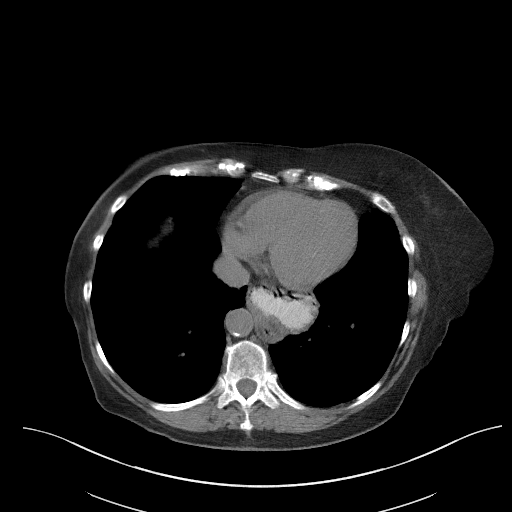

[Series 5: coronal st · coronal · 0.84mm/px · 3 of 89 slices shown]
[im 30/89  soft-tissue]
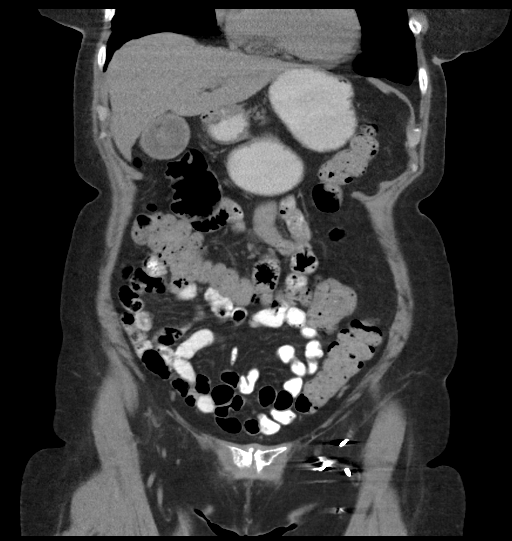
[im 40/89  soft-tissue]
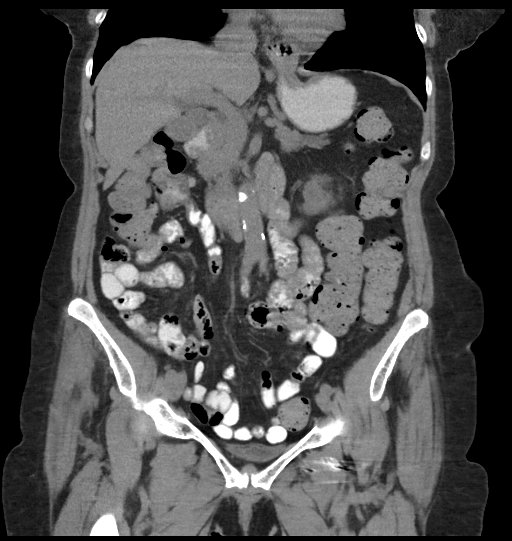
[im 49/89  soft-tissue]
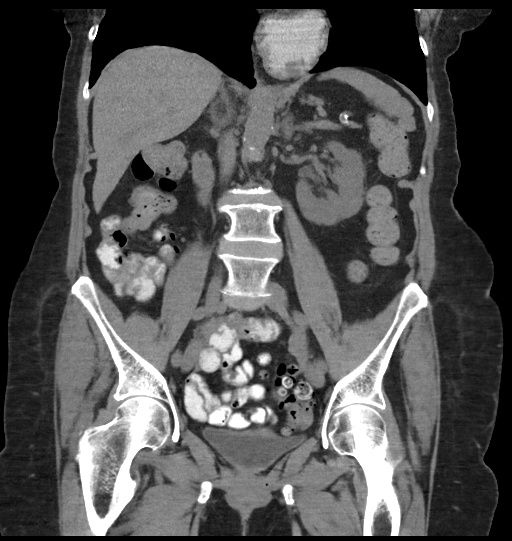

[16 of 46 positions shown; findings below may reference images not displayed]

FINDINGS: Lower chest: Large hiatal hernia.

Hepatobiliary: Subcentimeter low-attenuation lesion in segment 5 of
the liver is incompletely characterized on today's noncontrast CT
examination, but is similar to prior studies, likely tiny cysts. No
other new suspicious hepatic lesions are noted. Large noncalcified
but partially cavitary gallstones are present within the gallbladder
measuring up to 2.7 cm in diameter. No surrounding inflammatory
changes to suggest an acute cholecystitis at this time.

Pancreas: No definite pancreatic mass or peripancreatic inflammatory
changes are noted on today's noncontrast CT examination.

Spleen: Unremarkable.

Adrenals/Urinary Tract: Severe atrophy of the right kidney.
Compensatory hypertrophy of the left kidney. Multifocal cortical
thinning in the left kidney, suggesting scarring from prior
infection or infarctions. No hydroureteronephrosis. Small amount of
gas in the urinary bladder. Unenhanced appearance of the urinary
bladder is otherwise unremarkable.

Stomach/Bowel: The appearance of the intraabdominal portion of the
stomach is normal. There is no pathologic dilatation of small bowel
or colon. Numerous colonic diverticulae are noted, particularly in
the sigmoid colon, without surrounding inflammatory changes to
suggest an acute diverticulitis at this time. Normal appendix.

Vascular/Lymphatic: Aortic atherosclerosis, without definite
aneurysm in the abdominal or pelvic vasculature. No lymphadenopathy
noted in the abdomen or pelvis. Surgical clips in the left femoral
region, potentially from prior lymph node dissection or vascular
surgery.

Reproductive: Uterus and ovaries are unremarkable in appearance.

Other: No significant volume of ascites.  No pneumoperitoneum.

Musculoskeletal: There are no aggressive appearing lytic or blastic
lesions noted in the visualized portions of the skeleton.
IMPRESSION: 1. Colonic diverticulosis, most severe in the sigmoid colon. No
findings to suggest an acute diverticulitis at this time.
2. Small amount of gas in the urinary bladder. This may be
iatrogenic if there has been recent catheterization for urinalysis.
In the absence of a history of catheterization, further evaluation
with urinalysis and urine culture would be strongly recommended, as
is could indicate infection with gas-forming organisms.
3. Cholelithiasis without evidence of acute cholecystitis at this
time.
4. Normal appendix.
5. Aortic atherosclerosis.
6. Large hiatal hernia.
7. Additional incidental findings, as above.

## 2017-09-20 ENCOUNTER — Ambulatory Visit (INDEPENDENT_AMBULATORY_CARE_PROVIDER_SITE_OTHER): Payer: Medicare HMO | Admitting: Unknown Physician Specialty

## 2017-09-20 ENCOUNTER — Encounter: Payer: Self-pay | Admitting: Family Medicine

## 2017-09-20 VITALS — BP 159/71 | HR 78 | Temp 98.1°F | Wt 182.0 lb

## 2017-09-20 DIAGNOSIS — J029 Acute pharyngitis, unspecified: Secondary | ICD-10-CM

## 2017-09-20 MED ORDER — AMOXICILLIN 875 MG PO TABS: 875.0000 mg | ORAL_TABLET | Freq: Two times a day (BID) | ORAL | 0 refills | Status: DC

## 2017-09-20 NOTE — Progress Notes (Signed)
BP (!) 159/71   Pulse 78   Temp 98.1 F (36.7 C) (Oral)   Wt 182 lb (82.6 kg)   SpO2 98%   BMI 26.96 kg/m    Subjective:    Patient ID: Karla Little, female    DOB: Dec 17, 1933, 84 y.o.   MRN: [id number]  HPI: Karla Little is a 84 y.o. female  Chief Complaint  Patient presents with  . Sore Throat    x 2 days. OTC tylenol   Sore Throat   This is a new problem. Episode onset: 2 days. The problem has been unchanged. There has been no fever. Associated symptoms include congestion and headaches. Pertinent negatives include no abdominal pain, coughing, diarrhea, drooling, ear discharge, ear pain, hoarse voice, plugged ear sensation, neck pain, shortness of breath, swollen glands or vomiting. Associated symptoms comments: Neck and upper arm pain. She has tried acetaminophen for the symptoms. The treatment provided no relief.     Relevant past medical, surgical, family and social history reviewed and updated as indicated. Interim medical history since our last visit reviewed. Allergies and medications reviewed and updated.  Review of Systems  HENT: Positive for congestion. Negative for drooling, ear discharge, ear pain and hoarse voice.   Respiratory: Negative for cough and shortness of breath.   Gastrointestinal: Negative for abdominal pain, diarrhea and vomiting.  Musculoskeletal: Negative for neck pain.  Neurological: Positive for headaches.    Per HPI unless specifically indicated above     Objective:    BP (!) 159/71   Pulse 78   Temp 98.1 F (36.7 C) (Oral)   Wt 182 lb (82.6 kg)   SpO2 98%   BMI 26.96 kg/m   Wt Readings from Last 3 Encounters:  09/20/17 182 lb (82.6 kg)  06/24/17 178 lb (80.7 kg)  09/23/16 180 lb (81.6 kg)    Physical Exam  Constitutional: She is oriented to person, place, and time. She appears well-developed and well-nourished. No distress.  HENT:  Head: Normocephalic and atraumatic.  Right Ear: Tympanic membrane and ear canal  normal.  Left Ear: Tympanic membrane and ear canal normal.  Nose: Rhinorrhea present. Right sinus exhibits no maxillary sinus tenderness and no frontal sinus tenderness. Left sinus exhibits no maxillary sinus tenderness and no frontal sinus tenderness.  Mouth/Throat: Mucous membranes are normal. Posterior oropharyngeal erythema present.  Eyes: Conjunctivae and lids are normal. Right eye exhibits no discharge. Left eye exhibits no discharge. No scleral icterus.  Cardiovascular: Normal rate and regular rhythm.  Pulmonary/Chest: Effort normal and breath sounds normal. No respiratory distress.  Abdominal: Normal appearance. There is no splenomegaly or hepatomegaly.  Musculoskeletal: Normal range of motion.  Neurological: She is alert and oriented to person, place, and time.  Skin: Skin is intact. No rash noted. No pallor.  Psychiatric: She has a normal mood and affect. Her behavior is normal. Judgment and thought content normal.    Results for orders placed or performed in visit on 06/24/17  Microscopic Examination  Result Value Ref Range   WBC, UA 11-30 (A) 0 - 5 /hpf   RBC, UA None seen 0 - 2 /hpf   Epithelial Cells (non renal) 0-10 0 - 10 /hpf   Renal Epithel, UA 0-10 (A) None seen /hpf   Casts Present None seen /lpf   Cast Type Granular casts (A) N/A   Bacteria, UA Few (A) None seen/Few  Lipid panel  Result Value Ref Range   Cholesterol, Total 249 (H) 100 - 199  mg/dL   Triglycerides 154 (H) 0 - 149 mg/dL   HDL 71 >39 mg/dL   VLDL Cholesterol Cal 31 5 - 40 mg/dL   LDL Calculated 147 (H) 0 - 99 mg/dL   Chol/HDL Ratio 3.5 0.0 - 4.4 ratio  TSH  Result Value Ref Range   TSH 3.750 0.450 - 4.500 uIU/mL  Urinalysis, Routine w reflex microscopic  Result Value Ref Range   Specific Gravity, UA 1.015 1.005 - 1.030   pH, UA 6.0 5.0 - 7.5   Color, UA Yellow Yellow   Appearance Ur Clear Clear   Leukocytes, UA 3+ (A) Negative   Protein, UA 1+ (A) Negative/Trace   Glucose, UA Negative  Negative   Ketones, UA Negative Negative   RBC, UA Negative Negative   Bilirubin, UA Negative Negative   Urobilinogen, Ur 0.2 0.2 - 1.0 mg/dL   Nitrite, UA Negative Negative   Microscopic Examination See below:   CBC and differential  Result Value Ref Range   Hemoglobin 13.2 12.0 - 16.0   HCT 39 36 - 46   Neutrophils Absolute 3    Platelets 341 150 - 399   WBC 4.9   Basic metabolic panel  Result Value Ref Range   Glucose 97    BUN 25 (A) 4 - 21   Creatinine 1.5 (A) 0.5 - 1.1   Potassium 5.0 3.4 - 5.3   Sodium 139 137 - 147  Hepatic function panel  Result Value Ref Range   Alkaline Phosphatase 66 25 - 125   ALT 10 7 - 35   AST 13 13 - 35   Bilirubin, Total 0.4       Assessment & Plan:   Problem List Items Addressed This Visit    None    Visit Diagnoses    Sore throat    -  Primary   Encouraged supportive care.  Salt water gargles and Tylenol prn.  Rest and fluids.  Rx for Amoxil if worsening or not improved in 1 week    Relevant Orders   Rapid Strep Screen ([facility] ONLY)       Follow up plan: Return if symptoms worsen or fail to improve.

## 2017-09-23 LAB — CULTURE, GROUP A STREP: STREP A CULTURE: NEGATIVE

## 2017-09-23 LAB — RAPID STREP SCREEN (MED CTR MEBANE ONLY): Strep Gp A Ag, IA W/Reflex: NEGATIVE

## 2017-10-14 DIAGNOSIS — I10 Essential (primary) hypertension: Secondary | ICD-10-CM | POA: Diagnosis not present

## 2017-10-14 DIAGNOSIS — N183 Chronic kidney disease, stage 3 (moderate): Secondary | ICD-10-CM | POA: Diagnosis not present

## 2017-10-14 DIAGNOSIS — N2581 Secondary hyperparathyroidism of renal origin: Secondary | ICD-10-CM | POA: Diagnosis not present

## 2017-10-17 DIAGNOSIS — N2581 Secondary hyperparathyroidism of renal origin: Secondary | ICD-10-CM | POA: Diagnosis not present

## 2017-10-17 DIAGNOSIS — E872 Acidosis: Secondary | ICD-10-CM | POA: Diagnosis not present

## 2017-10-17 DIAGNOSIS — N183 Chronic kidney disease, stage 3 (moderate): Secondary | ICD-10-CM | POA: Diagnosis not present

## 2017-10-17 DIAGNOSIS — I10 Essential (primary) hypertension: Secondary | ICD-10-CM | POA: Diagnosis not present

## 2017-10-25 DIAGNOSIS — H1045 Other chronic allergic conjunctivitis: Secondary | ICD-10-CM | POA: Diagnosis not present

## 2017-11-21 DIAGNOSIS — D1801 Hemangioma of skin and subcutaneous tissue: Secondary | ICD-10-CM | POA: Diagnosis not present

## 2017-11-21 DIAGNOSIS — L821 Other seborrheic keratosis: Secondary | ICD-10-CM | POA: Diagnosis not present

## 2017-12-25 ENCOUNTER — Ambulatory Visit (INDEPENDENT_AMBULATORY_CARE_PROVIDER_SITE_OTHER): Payer: Medicare HMO | Admitting: Family Medicine

## 2017-12-25 ENCOUNTER — Encounter: Payer: Self-pay | Admitting: Family Medicine

## 2017-12-25 DIAGNOSIS — I10 Essential (primary) hypertension: Secondary | ICD-10-CM | POA: Diagnosis not present

## 2017-12-25 DIAGNOSIS — N184 Chronic kidney disease, stage 4 (severe): Secondary | ICD-10-CM | POA: Diagnosis not present

## 2017-12-25 DIAGNOSIS — E039 Hypothyroidism, unspecified: Secondary | ICD-10-CM | POA: Diagnosis not present

## 2017-12-25 NOTE — Assessment & Plan Note (Signed)
Stable from review of nephrology notes

## 2017-12-25 NOTE — Assessment & Plan Note (Addendum)
The current medical regimen is effective;  continue present plan and medications. Blood pressure is a little high but patient is a little lightheaded with standing and on review of nephrology notes same concerns. Will leave blood pressure medication alone.

## 2017-12-25 NOTE — Assessment & Plan Note (Signed)
The current medical regimen is effective;  continue present plan and medications.  

## 2017-12-25 NOTE — Progress Notes (Signed)
   BP (!) 154/74 (BP Location: Left Arm, Patient Position: Sitting, Cuff Size: Normal)   Pulse 65   Temp 98.4 F (36.9 C) (Tympanic)   Ht 5\' 7"  (1.702 m)   Wt 183 lb 12.8 oz (83.4 kg)   SpO2 99%   BMI 28.79 kg/m    Subjective:    Patient ID: Karla Little, female    DOB: 26-Jan-1934, 84 y.o.   MRN: [id number]  HPI: Karla Little is a 84 y.o. female  Chief Complaint  Patient presents with  . Hypertension  . Hyperlipidemia  Patient all in all doing well no complaints taking blood pressure medications without problems or issues Does have a little lightheaded sometimes with standing up no falling episodes. Reviewed nephrology notes and blood pressure control adequate with suggestion for fall risk and lightheaded. CKD stable on review. No issues cholesterol. Thyroid also stable.   Relevant past medical, surgical, family and social history reviewed and updated as indicated. Interim medical history since our last visit reviewed. Allergies and medications reviewed and updated.  Review of Systems  Constitutional: Negative.   Respiratory: Negative.   Cardiovascular: Negative.     Per HPI unless specifically indicated above     Objective:    BP (!) 154/74 (BP Location: Left Arm, Patient Position: Sitting, Cuff Size: Normal)   Pulse 65   Temp 98.4 F (36.9 C) (Tympanic)   Ht 5\' 7"  (1.702 m)   Wt 183 lb 12.8 oz (83.4 kg)   SpO2 99%   BMI 28.79 kg/m   Wt Readings from Last 3 Encounters:  12/25/17 183 lb 12.8 oz (83.4 kg)  09/20/17 182 lb (82.6 kg)  06/24/17 178 lb (80.7 kg)    Physical Exam  Constitutional: She is oriented to person, place, and time. She appears well-developed and well-nourished.  HENT:  Head: Normocephalic and atraumatic.  Eyes: Conjunctivae and EOM are normal.  Neck: Normal range of motion.  Cardiovascular: Normal rate, regular rhythm and normal heart sounds.  Pulmonary/Chest: Effort normal and breath sounds normal.  Musculoskeletal: Normal  range of motion.  Neurological: She is alert and oriented to person, place, and time.  Skin: No erythema.  Psychiatric: She has a normal mood and affect. Her behavior is normal. Judgment and thought content normal.    Results for orders placed or performed in visit on 09/20/17  Rapid Strep Screen ([facility] ONLY)  Result Value Ref Range   Strep Gp A Ag, IA W/Reflex Negative Negative  Culture, Group A Strep  Result Value Ref Range   Strep A Culture Negative       Assessment & Plan:   Problem List Items Addressed This Visit      Cardiovascular and Mediastinum   Hypertension    The current medical regimen is effective;  continue present plan and medications. Blood pressure is a little high but patient is a little lightheaded with standing and on review of nephrology notes same concerns. Will leave blood pressure medication alone.         Endocrine   Hypothyroidism    The current medical regimen is effective;  continue present plan and medications.         Genitourinary   Chronic kidney disease, stage 4, severely decreased GFR ([place])    Stable from review of nephrology notes          Follow up plan: Return in about 6 months (around 06/27/2018) for Physical Exam.

## 2018-02-21 DIAGNOSIS — N2581 Secondary hyperparathyroidism of renal origin: Secondary | ICD-10-CM | POA: Diagnosis not present

## 2018-02-21 DIAGNOSIS — N183 Chronic kidney disease, stage 3 (moderate): Secondary | ICD-10-CM | POA: Diagnosis not present

## 2018-02-21 DIAGNOSIS — I10 Essential (primary) hypertension: Secondary | ICD-10-CM | POA: Diagnosis not present

## 2018-02-21 DIAGNOSIS — E872 Acidosis: Secondary | ICD-10-CM | POA: Diagnosis not present

## 2018-03-17 ENCOUNTER — Telehealth: Payer: Self-pay | Admitting: Family Medicine

## 2018-03-17 ENCOUNTER — Ambulatory Visit (INDEPENDENT_AMBULATORY_CARE_PROVIDER_SITE_OTHER): Payer: Medicare HMO

## 2018-03-17 DIAGNOSIS — Z23 Encounter for immunization: Secondary | ICD-10-CM | POA: Diagnosis not present

## 2018-03-17 NOTE — Telephone Encounter (Signed)
Pt came in for flu shot and stated that she now wants her prescriptions to go to [facility] in [place] and no longer [place].

## 2018-03-17 NOTE — Telephone Encounter (Signed)
Updated in chart

## 2018-03-20 ENCOUNTER — Telehealth: Payer: Self-pay | Admitting: Family Medicine

## 2018-03-20 DIAGNOSIS — Z23 Encounter for immunization: Secondary | ICD-10-CM

## 2018-03-20 NOTE — Telephone Encounter (Signed)
Yes go ahead with hepatitis B series

## 2018-03-20 NOTE — Telephone Encounter (Signed)
Please advise 

## 2018-03-20 NOTE — Telephone Encounter (Signed)
Copied from [place] #[id number]. Topic: Quick Communication - See Telephone Encounter >> Mar 20, 2018  3:03 PM Gardiner Ramus wrote: CRM for notification. See Telephone encounter for: 03/20/18. Pt called and stated that she received a call from [place] letting her know that because of her kidney issues she could be susceptible for hepatitis. Pt would like to know if she should be vaccinated. Please advise  [phone]

## 2018-03-21 NOTE — Telephone Encounter (Signed)
Please place future orders for all 3 vaccines.

## 2018-06-30 DIAGNOSIS — N183 Chronic kidney disease, stage 3 (moderate): Secondary | ICD-10-CM | POA: Diagnosis not present

## 2018-06-30 DIAGNOSIS — I10 Essential (primary) hypertension: Secondary | ICD-10-CM | POA: Diagnosis not present

## 2018-06-30 DIAGNOSIS — N2581 Secondary hyperparathyroidism of renal origin: Secondary | ICD-10-CM | POA: Diagnosis not present

## 2018-06-30 DIAGNOSIS — E872 Acidosis: Secondary | ICD-10-CM | POA: Diagnosis not present

## 2018-07-02 ENCOUNTER — Encounter: Payer: Self-pay | Admitting: Family Medicine

## 2018-07-02 ENCOUNTER — Ambulatory Visit (INDEPENDENT_AMBULATORY_CARE_PROVIDER_SITE_OTHER): Payer: Medicare HMO | Admitting: Family Medicine

## 2018-07-02 DIAGNOSIS — N184 Chronic kidney disease, stage 4 (severe): Secondary | ICD-10-CM

## 2018-07-02 DIAGNOSIS — I10 Essential (primary) hypertension: Secondary | ICD-10-CM

## 2018-07-02 DIAGNOSIS — E039 Hypothyroidism, unspecified: Secondary | ICD-10-CM | POA: Diagnosis not present

## 2018-07-02 DIAGNOSIS — Z7189 Other specified counseling: Secondary | ICD-10-CM | POA: Diagnosis not present

## 2018-07-02 DIAGNOSIS — N898 Other specified noninflammatory disorders of vagina: Secondary | ICD-10-CM | POA: Insufficient documentation

## 2018-07-02 MED ORDER — LEVOTHYROXINE SODIUM 50 MCG PO TABS: 50.0000 ug | ORAL_TABLET | Freq: Every day | ORAL | 4 refills | Status: DC

## 2018-07-02 MED ORDER — HYDRALAZINE HCL 25 MG PO TABS: 50.0000 mg | ORAL_TABLET | Freq: Every day | ORAL | 4 refills | Status: DC

## 2018-07-02 MED ORDER — LISINOPRIL 10 MG PO TABS: 10.0000 mg | ORAL_TABLET | Freq: Every day | ORAL | 4 refills | Status: DC

## 2018-07-02 MED ORDER — AMLODIPINE BESYLATE 10 MG PO TABS: 10.0000 mg | ORAL_TABLET | Freq: Every day | ORAL | 4 refills | Status: DC

## 2018-07-02 NOTE — Assessment & Plan Note (Signed)
A voluntary discussion about advanced care planning including explanation and discussion of advanced directives was extentively discussed with the patient.  Explained about the healthcare proxy and living will was reviewed and packet with forms with expiration of how to fill them out was given.  Time spent: Encounter 16+ min individuals present: Patient 

## 2018-07-02 NOTE — Assessment & Plan Note (Signed)
Followed by nephrology. 

## 2018-07-02 NOTE — Assessment & Plan Note (Signed)
The current medical regimen is effective;  continue present plan and medications.  

## 2018-07-02 NOTE — Progress Notes (Addendum)
Depression screen [facility] 2/9 07/02/2018 06/24/2017 06/22/2016  Decreased Interest 0 0 0  Down, Depressed, Hopeless 0 0 0  PHQ - 2 Score 0 0 0  Altered sleeping 0 - -  Tired, decreased energy 0 - -  Change in appetite 0 - -  Feeling bad or failure about yourself  0 - -  Trouble concentrating 0 - -  Moving slowly or fidgety/restless 0 - -  Suicidal thoughts 0 - -  PHQ-9 Score 0 - -   Functional Status Survey: Is the patient deaf or have difficulty hearing?: Yes Does the patient have difficulty seeing, even when wearing glasses/contacts?: No Does the patient have difficulty concentrating, remembering, or making decisions?: No Does the patient have difficulty walking or climbing stairs?: No Does the patient have difficulty dressing or bathing?: No Does the patient have difficulty doing errands alone such as visiting a doctor's office or shopping?: No   Fall Risk  07/02/2018 09/20/2017 06/24/2017 06/22/2016 03/16/2015  Falls in the past year? 0 No No No No  Number falls in past yr: 0 - - - -  Injury with Fall? 0 - - - -

## 2018-07-02 NOTE — Progress Notes (Signed)
BP 138/66 (BP Location: Left Arm, Patient Position: Sitting, Cuff Size: Normal)   Pulse 85   Temp 98.2 F (36.8 C) (Oral)   Ht 5\' 9"  (1.753 m)   Wt 178 lb 12.8 oz (81.1 kg)   SpO2 100%   BMI 26.40 kg/m    Subjective:    Patient ID: Karla Little, female    DOB: 29-May-1934, 85 y.o.   MRN: [id number]  HPI: Karla Little is a 85 y.o. female  Chief Complaint  Patient presents with  . Annual Exam  Patient concerned over the last 2 weeks is developed a lump in the vaginal opening area. This lesion is painless. Patient otherwise with good blood pressure control was at kidney doctors earlier this week and it was elevated. Taking medication without side effects or problems. Takes vitamins also without problems. Also takes thyroid medication faithfully and without problems.  Relevant past medical, surgical, family and social history reviewed and updated as indicated. Interim medical history since our last visit reviewed. Allergies and medications reviewed and updated.  Review of Systems  Constitutional: Negative.   HENT: Negative.   Eyes: Negative.   Respiratory: Negative.   Cardiovascular: Negative.   Gastrointestinal: Negative.   Endocrine: Negative.   Genitourinary: Negative.   Musculoskeletal: Negative.   Skin: Negative.   Allergic/Immunologic: Negative.   Neurological: Negative.   Hematological: Negative.   Psychiatric/Behavioral: Negative.     Per HPI unless specifically indicated above     Objective:    BP 138/66 (BP Location: Left Arm, Patient Position: Sitting, Cuff Size: Normal)   Pulse 85   Temp 98.2 F (36.8 C) (Oral)   Ht 5\' 9"  (1.753 m)   Wt 178 lb 12.8 oz (81.1 kg)   SpO2 100%   BMI 26.40 kg/m   Wt Readings from Last 3 Encounters:  07/02/18 178 lb 12.8 oz (81.1 kg)  12/25/17 183 lb 12.8 oz (83.4 kg)  09/20/17 182 lb (82.6 kg)    Physical Exam Constitutional:      Appearance: She is well-developed.  HENT:     Head: Normocephalic and  atraumatic.     Right Ear: External ear normal.     Left Ear: External ear normal.     Nose: Nose normal.  Eyes:     Conjunctiva/sclera: Conjunctivae normal.     Pupils: Pupils are equal, round, and reactive to light.  Neck:     Musculoskeletal: Normal range of motion and neck supple.     Vascular: No carotid bruit.  Cardiovascular:     Rate and Rhythm: Normal rate and regular rhythm.     Heart sounds: Normal heart sounds. No murmur.  Pulmonary:     Effort: Pulmonary effort is normal.     Breath sounds: Normal breath sounds.  Chest:     Chest wall: No mass.     Breasts: Breasts are symmetrical.        Right: No mass, skin change or tenderness.        Left: No mass, skin change or tenderness.  Abdominal:     General: Bowel sounds are normal.     Palpations: Abdomen is soft.  Genitourinary:    General: Normal vulva.     Rectum: Normal.     Comments: Posterior vaginal fourchette area with some roughened tissues.  Feeling firmer than normal. Musculoskeletal: Normal range of motion.  Skin:    Findings: No rash.  Neurological:     Mental Status: She is alert and  oriented to person, place, and time.  Psychiatric:        Behavior: Behavior normal.        Thought Content: Thought content normal.        Judgment: Judgment normal.     Results for orders placed or performed in visit on 09/20/17  Rapid Strep Screen ([facility] ONLY)  Result Value Ref Range   Strep Gp A Ag, IA W/Reflex Negative Negative  Culture, Group A Strep  Result Value Ref Range   Strep A Culture Negative       Assessment & Plan:   Problem List Items Addressed This Visit      Cardiovascular and Mediastinum   Hypertension    The current medical regimen is effective;  continue present plan and medications.       Relevant Medications   lisinopril (PRINIVIL,ZESTRIL) 10 MG tablet   amLODipine (NORVASC) 10 MG tablet   hydrALAZINE (APRESOLINE) 25 MG tablet   Other Relevant Orders   Lipid panel      Endocrine   Hypothyroidism    The current medical regimen is effective;  continue present plan and medications.       Relevant Medications   levothyroxine (SYNTHROID, LEVOTHROID) 50 MCG tablet   Other Relevant Orders   CBC with Differential/Platelet   TSH     Genitourinary   Chronic kidney disease, stage 4, severely decreased GFR ([facility])    Followed by nephrology      Relevant Medications   lisinopril (PRINIVIL,ZESTRIL) 10 MG tablet   amLODipine (NORVASC) 10 MG tablet   Other Relevant Orders   Comprehensive metabolic panel   Lipid panel   CBC with Differential/Platelet     Other   Advanced care planning/counseling discussion    A voluntary discussion about advanced care planning including explanation and discussion of advanced directives was extentively discussed with the patient.  Explained about the healthcare proxy and living will was reviewed and packet with forms with expiration of how to fill them out was given.  Time spent: Encounter 16+ min individuals present: Patient      Relevant Orders   CBC with Differential/Platelet   Vaginal lesion    Nonspecific vaginal lesion will refer to GYN to further evaluate.      Relevant Orders   Ambulatory referral to Obstetrics / Gynecology       Follow up plan: Return in about 1 year (around 07/03/2019) for Physical Exam.

## 2018-07-02 NOTE — Assessment & Plan Note (Signed)
Nonspecific vaginal lesion will refer to GYN to further evaluate.

## 2018-07-03 ENCOUNTER — Telehealth: Payer: Self-pay | Admitting: Family Medicine

## 2018-07-03 LAB — LIPID PANEL
CHOLESTEROL TOTAL: 268 mg/dL — AB (ref 100–199)
Chol/HDL Ratio: 3.4 ratio (ref 0.0–4.4)
HDL: 78 mg/dL (ref >39)
LDL Calculated: 164 mg/dL — ABNORMAL HIGH (ref 0–99)
TRIGLYCERIDES: 132 mg/dL (ref 0–149)
VLDL CHOLESTEROL CAL: 26 mg/dL (ref 5–40)

## 2018-07-03 LAB — CBC WITH DIFFERENTIAL/PLATELET
BASOS: 1 %
Basophils Absolute: 0 10*3/uL (ref 0.0–0.2)
EOS (ABSOLUTE): 0.1 10*3/uL (ref 0.0–0.4)
EOS: 2 %
HEMATOCRIT: 39 % (ref 34.0–46.6)
Hemoglobin: 13 g/dL (ref 11.1–15.9)
IMMATURE GRANS (ABS): 0 10*3/uL (ref 0.0–0.1)
IMMATURE GRANULOCYTES: 0 %
LYMPHS: 26 %
Lymphocytes Absolute: 1.1 10*3/uL (ref 0.7–3.1)
MCH: 31 pg (ref 26.6–33.0)
MCHC: 33.3 g/dL (ref 31.5–35.7)
MCV: 93 fL (ref 79–97)
Monocytes Absolute: 0.4 10*3/uL (ref 0.1–0.9)
Monocytes: 10 %
NEUTROS PCT: 61 %
Neutrophils Absolute: 2.6 10*3/uL (ref 1.4–7.0)
Platelets: 257 10*3/uL (ref 150–450)
RBC: 4.2 x10E6/uL (ref 3.77–5.28)
RDW: 12.8 % (ref 11.7–15.4)
WBC: 4.2 10*3/uL (ref 3.4–10.8)

## 2018-07-03 LAB — COMPREHENSIVE METABOLIC PANEL
A/G RATIO: 1.9 (ref 1.2–2.2)
ALK PHOS: 78 IU/L (ref 39–117)
ALT: 10 IU/L (ref 0–32)
AST: 11 IU/L (ref 0–40)
Albumin: 4.4 g/dL (ref 3.6–4.6)
BUN/Creatinine Ratio: 12 (ref 12–28)
BUN: 20 mg/dL (ref 8–27)
Bilirubin Total: 0.4 mg/dL (ref 0.0–1.2)
CALCIUM: 9.9 mg/dL (ref 8.7–10.3)
CHLORIDE: 102 mmol/L (ref 96–106)
CO2: 18 mmol/L — AB (ref 20–29)
Creatinine, Ser: 1.63 mg/dL — ABNORMAL HIGH (ref 0.57–1.00)
GFR calc Af Amer: 34 mL/min/{1.73_m2} — ABNORMAL LOW (ref >59)
GFR calc non Af Amer: 29 mL/min/{1.73_m2} — ABNORMAL LOW (ref >59)
GLOBULIN, TOTAL: 2.3 g/dL (ref 1.5–4.5)
Glucose: 95 mg/dL (ref 65–99)
Potassium: 4.7 mmol/L (ref 3.5–5.2)
SODIUM: 136 mmol/L (ref 134–144)
TOTAL PROTEIN: 6.7 g/dL (ref 6.0–8.5)

## 2018-07-03 LAB — TSH: TSH: 2.76 u[IU]/mL (ref 0.450–4.500)

## 2018-07-03 NOTE — Telephone Encounter (Signed)
Phone call Discussed with patient elevated cholesterol patient will do better with diet and nutrition we will recheck cholesterol next office visit.

## 2018-07-07 ENCOUNTER — Ambulatory Visit: Payer: Medicare HMO | Admitting: Family Medicine

## 2018-07-09 DIAGNOSIS — J09X2 Influenza due to identified novel influenza A virus with other respiratory manifestations: Secondary | ICD-10-CM | POA: Diagnosis not present

## 2018-07-15 ENCOUNTER — Emergency Department
Admission: EM | Admit: 2018-07-15 | Discharge: 2018-07-15 | Disposition: A | Discharge status: Home / Self Care | Payer: Medicare HMO | Attending: Emergency Medicine | Admitting: Emergency Medicine

## 2018-07-15 ENCOUNTER — Encounter: Payer: Self-pay | Admitting: Emergency Medicine

## 2018-07-15 ENCOUNTER — Emergency Department: Payer: Medicare HMO

## 2018-07-15 DIAGNOSIS — R05 Cough: Secondary | ICD-10-CM | POA: Diagnosis not present

## 2018-07-15 DIAGNOSIS — B9789 Other viral agents as the cause of diseases classified elsewhere: Secondary | ICD-10-CM

## 2018-07-15 DIAGNOSIS — Z79899 Other long term (current) drug therapy: Secondary | ICD-10-CM | POA: Insufficient documentation

## 2018-07-15 DIAGNOSIS — I129 Hypertensive chronic kidney disease with stage 1 through stage 4 chronic kidney disease, or unspecified chronic kidney disease: Secondary | ICD-10-CM | POA: Insufficient documentation

## 2018-07-15 DIAGNOSIS — J069 Acute upper respiratory infection, unspecified: Secondary | ICD-10-CM | POA: Diagnosis not present

## 2018-07-15 DIAGNOSIS — E785 Hyperlipidemia, unspecified: Secondary | ICD-10-CM | POA: Insufficient documentation

## 2018-07-15 DIAGNOSIS — N184 Chronic kidney disease, stage 4 (severe): Secondary | ICD-10-CM | POA: Insufficient documentation

## 2018-07-15 MED ORDER — ALBUTEROL SULFATE HFA 108 (90 BASE) MCG/ACT IN AERS: 2.0000 | INHALATION_SPRAY | Freq: Four times a day (QID) | RESPIRATORY_TRACT | 2 refills | Status: DC | PRN

## 2018-07-15 MED ORDER — IPRATROPIUM-ALBUTEROL 0.5-2.5 (3) MG/3ML IN SOLN
3.0000 mL | Freq: Once | RESPIRATORY_TRACT | Status: AC
  Administered 2018-07-15: 3 mL via RESPIRATORY_TRACT
  Filled 2018-07-15: qty 3

## 2018-07-15 NOTE — ED Provider Notes (Signed)
[facility] Emergency Department Provider Note   ____________________________________________    I have reviewed the triage vital signs and the nursing notes.   HISTORY  Chief Complaint Cough     HPI Karla Little is a 85 y.o. female who presents with complaints of a cough.  Patient reports she was diagnosed with the flu last week, seemed to have improved from that but developed a cough which she describes as productive of yellow sputum.  She reports chronic mild shortness of breath, which has not worsened.  No reports of fevers.  No recent travel.  No calf pain or leg swelling.  Has not take anything for this.  Does not smoke  Past Medical History:  Diagnosis Date  . Chronic kidney disease   . Hyperlipidemia   . Hypertension   . Hypothyroidism   . Osteoporosis   . Renal insufficiency   . Vertigo     Patient Active Problem List   Diagnosis Date Noted  . Vaginal lesion 07/02/2018  . Advanced care planning/counseling discussion 06/24/2017  . Atrophy of right kidney 03/16/2015  . Chronic kidney disease, stage 4, severely decreased GFR (HCC) 03/16/2015  . Shoulder bursitis 03/16/2015  . Hypothyroidism   . Hyperlipidemia   . Hypertension     Past Surgical History:  Procedure Laterality Date  . BREAST BIOPSY    . COLONOSCOPY W/ POLYPECTOMY    . COLONOSCOPY WITH PROPOFOL N/A 06/02/2015   Procedure: COLONOSCOPY WITH PROPOFOL;  Surgeon: Manya Silvas, MD;  Location: [facility] ENDOSCOPY;  Service: Endoscopy;  Laterality: N/A;    Prior to Admission medications   Medication Sig Start Date End Date Taking? Authorizing Provider  albuterol (PROVENTIL HFA;VENTOLIN HFA) 108 (90 Base) MCG/ACT inhaler Inhale 2 puffs into the lungs every 6 (six) hours as needed for wheezing or shortness of breath. 07/15/18   Lavonia Drafts, MD  amLODipine (NORVASC) 10 MG tablet Take 1 tablet (10 mg total) by mouth daily. 07/02/18   [name], MD  calcitRIOL  (ROCALTROL) 0.25 MCG capsule Take 0.25 mcg by mouth. 11/24/15   [provider]  hydrALAZINE (APRESOLINE) 25 MG tablet Take 2 tablets (50 mg total) by mouth daily. 07/02/18   [name], MD  levothyroxine (SYNTHROID, LEVOTHROID) 50 MCG tablet Take 1 tablet (50 mcg total) by mouth daily. 07/02/18   [name], MD  lisinopril (PRINIVIL,ZESTRIL) 10 MG tablet Take 1 tablet (10 mg total) by mouth daily. 07/02/18   [name], MD  nitroGLYCERIN (NITROSTAT) 0.4 MG SL tablet Place 0.4 mg under the tongue every 5 (five) minutes as needed for chest pain.    [provider]  sodium bicarbonate 650 MG tablet  02/25/15   [provider]  vitamin B-12 (CYANOCOBALAMIN) 250 MCG tablet Take 250 mcg by mouth daily.    [provider]  Vitamin D, Ergocalciferol, (DRISDOL) 50000 UNITS CAPS capsule Take 50,000 Units by mouth daily.     [provider]  vitamin E 200 UNIT capsule Take 200 Units by mouth daily.    [provider]     Allergies Amoxicillin  Family History  Problem Relation Age of Onset  . Hypertension Mother   . Heart disease Father   . Cancer Sister        breast    Social History Social History   Tobacco Use  . Smoking status: Never Smoker  . Smokeless tobacco: Never Used  Substance Use Topics  . Alcohol use:  No  . Drug use: No    Review of Systems  Constitutional: No fever/chills  ENT: No throat swelling Tori: As above  Gastrointestinal:   No nausea, no vomiting.    Musculoskeletal: No further myalgias Skin: Negative for rash.     ____________________________________________   PHYSICAL EXAM:  VITAL SIGNS: ED Triage Vitals [07/15/18 1037]  Enc Vitals Group     BP (!) 128/58     Pulse Rate 85     Resp 18     Temp 98.3 F (36.8 C)     Temp Source Oral     SpO2 97 %     Weight      Height      Head Circumference      Peak Flow      Pain Score      Pain Loc      Pain Edu?      Excl. in [place]?       Constitutional: Alert and oriented. No acute distress.  Anxious Eyes: Conjunctivae are normal.  Head: Atraumatic. Nose: No congestion/rhinnorhea. Mouth/Throat: Mucous membranes are moist.   Cardiovascular: Normal rate, regular rhythm.  Respiratory: Normal respiratory effort.  No retractions.  Clear to auscultation bilaterally  Musculoskeletal: No lower extremity tenderness nor edema.   Neurologic:  Normal speech and language. No gross focal neurologic deficits are appreciated.   Skin:  Skin is warm, dry and intact. No rash noted.   ____________________________________________   LABS (all labs ordered are listed, but only abnormal results are displayed)  Labs Reviewed - No data to display ____________________________________________  EKG   ____________________________________________  RADIOLOGY  Negative for pneumonia ____________________________________________   PROCEDURES  Procedure(s) performed: No  Procedures   Critical Care performed: No ____________________________________________   INITIAL IMPRESSION / ASSESSMENT AND PLAN / ED COURSE  Pertinent labs & imaging results that were available during my care of the patient were reviewed by me and considered in my medical decision making (see chart for details).  Patient well-appearing and in no acute distress, vital signs quite reassuring.  Lung exam is overall unremarkable, treated with DuoNeb which helped her symptoms somewhat, suspect upper respiratory infection possibly with some mild bronchospasm.  No indication for antibiotics at this time given likely viral etiology, recommend outpatient follow-up with PCP   ____________________________________________   FINAL CLINICAL IMPRESSION(S) / ED DIAGNOSES  Final diagnoses:  Viral URI with cough      NEW MEDICATIONS STARTED DURING THIS VISIT:  New Prescriptions   ALBUTEROL (PROVENTIL HFA;VENTOLIN HFA) 108 (90 BASE) MCG/ACT INHALER    Inhale 2  puffs into the lungs every 6 (six) hours as needed for wheezing or shortness of breath.     Note:  This document was prepared using Dragon voice recognition software and may include unintentional dictation errors.   Lavonia Drafts, MD 07/15/18 1216

## 2018-07-15 NOTE — ED Triage Notes (Signed)
Dx flu last week.  Says unsure of fever.  Says she has had cough and it is in her chest.

## 2018-09-11 ENCOUNTER — Other Ambulatory Visit: Payer: Self-pay

## 2018-09-11 ENCOUNTER — Inpatient Hospital Stay
Admission: EM | Admit: 2018-09-11 | Discharge: 2018-09-14 | DRG: 440 | Disposition: A | Discharge status: Home / Self Care | Payer: Medicare HMO | Attending: Internal Medicine | Admitting: Internal Medicine

## 2018-09-11 ENCOUNTER — Emergency Department: Payer: Medicare HMO

## 2018-09-11 ENCOUNTER — Encounter: Payer: Self-pay | Admitting: Emergency Medicine

## 2018-09-11 DIAGNOSIS — R1900 Intra-abdominal and pelvic swelling, mass and lump, unspecified site: Secondary | ICD-10-CM

## 2018-09-11 DIAGNOSIS — K859 Acute pancreatitis without necrosis or infection, unspecified: Secondary | ICD-10-CM

## 2018-09-11 DIAGNOSIS — R1907 Generalized intra-abdominal and pelvic swelling, mass and lump: Secondary | ICD-10-CM | POA: Diagnosis not present

## 2018-09-11 DIAGNOSIS — M810 Age-related osteoporosis without current pathological fracture: Secondary | ICD-10-CM | POA: Diagnosis present

## 2018-09-11 DIAGNOSIS — N183 Chronic kidney disease, stage 3 (moderate): Secondary | ICD-10-CM | POA: Diagnosis present

## 2018-09-11 DIAGNOSIS — Z66 Do not resuscitate: Secondary | ICD-10-CM | POA: Diagnosis present

## 2018-09-11 DIAGNOSIS — I10 Essential (primary) hypertension: Secondary | ICD-10-CM | POA: Diagnosis not present

## 2018-09-11 DIAGNOSIS — Z79899 Other long term (current) drug therapy: Secondary | ICD-10-CM

## 2018-09-11 DIAGNOSIS — Z880 Allergy status to penicillin: Secondary | ICD-10-CM

## 2018-09-11 DIAGNOSIS — I129 Hypertensive chronic kidney disease with stage 1 through stage 4 chronic kidney disease, or unspecified chronic kidney disease: Secondary | ICD-10-CM | POA: Diagnosis present

## 2018-09-11 DIAGNOSIS — R1013 Epigastric pain: Secondary | ICD-10-CM | POA: Diagnosis not present

## 2018-09-11 DIAGNOSIS — E785 Hyperlipidemia, unspecified: Secondary | ICD-10-CM | POA: Diagnosis present

## 2018-09-11 DIAGNOSIS — R079 Chest pain, unspecified: Secondary | ICD-10-CM | POA: Diagnosis not present

## 2018-09-11 DIAGNOSIS — N859 Noninflammatory disorder of uterus, unspecified: Secondary | ICD-10-CM | POA: Diagnosis not present

## 2018-09-11 DIAGNOSIS — Z8249 Family history of ischemic heart disease and other diseases of the circulatory system: Secondary | ICD-10-CM

## 2018-09-11 DIAGNOSIS — E039 Hypothyroidism, unspecified: Secondary | ICD-10-CM | POA: Diagnosis present

## 2018-09-11 DIAGNOSIS — N898 Other specified noninflammatory disorders of vagina: Secondary | ICD-10-CM | POA: Diagnosis present

## 2018-09-11 DIAGNOSIS — R000 Tachycardia, unspecified: Secondary | ICD-10-CM | POA: Diagnosis not present

## 2018-09-11 DIAGNOSIS — K851 Biliary acute pancreatitis without necrosis or infection: Secondary | ICD-10-CM | POA: Diagnosis not present

## 2018-09-11 DIAGNOSIS — R1084 Generalized abdominal pain: Secondary | ICD-10-CM | POA: Diagnosis not present

## 2018-09-11 DIAGNOSIS — D259 Leiomyoma of uterus, unspecified: Secondary | ICD-10-CM | POA: Diagnosis present

## 2018-09-11 DIAGNOSIS — Z803 Family history of malignant neoplasm of breast: Secondary | ICD-10-CM

## 2018-09-11 DIAGNOSIS — Z79890 Hormone replacement therapy (postmenopausal): Secondary | ICD-10-CM

## 2018-09-11 DIAGNOSIS — N858 Other specified noninflammatory disorders of uterus: Secondary | ICD-10-CM

## 2018-09-11 DIAGNOSIS — R740 Nonspecific elevation of levels of transaminase and lactic acid dehydrogenase [LDH]: Secondary | ICD-10-CM | POA: Diagnosis present

## 2018-09-11 LAB — CBC WITH DIFFERENTIAL/PLATELET
Abs Immature Granulocytes: 0.01 10*3/uL (ref 0.00–0.07)
Basophils Absolute: 0 10*3/uL (ref 0.0–0.1)
Basophils Relative: 0 %
Eosinophils Absolute: 0 10*3/uL (ref 0.0–0.5)
Eosinophils Relative: 0 %
HCT: 37.4 % (ref 36.0–46.0)
Hemoglobin: 12.5 g/dL (ref 12.0–15.0)
Immature Granulocytes: 0 %
Lymphocytes Relative: 7 %
Lymphs Abs: 0.4 10*3/uL — ABNORMAL LOW (ref 0.7–4.0)
MCH: 31.4 pg (ref 26.0–34.0)
MCHC: 33.4 g/dL (ref 30.0–36.0)
MCV: 94 fL (ref 80.0–100.0)
Monocytes Absolute: 0.5 10*3/uL (ref 0.1–1.0)
Monocytes Relative: 8 %
Neutro Abs: 5.2 10*3/uL (ref 1.7–7.7)
Neutrophils Relative %: 85 %
Platelets: 189 10*3/uL (ref 150–400)
RBC: 3.98 MIL/uL (ref 3.87–5.11)
RDW: 12.7 % (ref 11.5–15.5)
WBC: 5.9 10*3/uL (ref 4.0–10.5)
nRBC: 0 % (ref 0.0–0.2)

## 2018-09-11 LAB — HEPATIC FUNCTION PANEL
ALT: 371 U/L — ABNORMAL HIGH (ref 0–44)
AST: 741 U/L — ABNORMAL HIGH (ref 15–41)
Albumin: 3.8 g/dL (ref 3.5–5.0)
Alkaline Phosphatase: 155 U/L — ABNORMAL HIGH (ref 38–126)
Bilirubin, Direct: 0.3 mg/dL — ABNORMAL HIGH (ref 0.0–0.2)
Indirect Bilirubin: 0.4 mg/dL (ref 0.3–0.9)
Total Bilirubin: 0.7 mg/dL (ref 0.3–1.2)
Total Protein: 7.3 g/dL (ref 6.5–8.1)

## 2018-09-11 LAB — BASIC METABOLIC PANEL
Anion gap: 10 (ref 5–15)
BUN: 30 mg/dL — ABNORMAL HIGH (ref 8–23)
CO2: 17 mmol/L — ABNORMAL LOW (ref 22–32)
Calcium: 9.9 mg/dL (ref 8.9–10.3)
Chloride: 107 mmol/L (ref 98–111)
Creatinine, Ser: 1.62 mg/dL — ABNORMAL HIGH (ref 0.44–1.00)
GFR calc Af Amer: 34 mL/min — ABNORMAL LOW (ref >60)
GFR calc non Af Amer: 29 mL/min — ABNORMAL LOW (ref >60)
Glucose, Bld: 155 mg/dL — ABNORMAL HIGH (ref 70–99)
Potassium: 4.2 mmol/L (ref 3.5–5.1)
Sodium: 134 mmol/L — ABNORMAL LOW (ref 135–145)

## 2018-09-11 LAB — URINALYSIS, COMPLETE (UACMP) WITH MICROSCOPIC
Bilirubin Urine: NEGATIVE
Glucose, UA: NEGATIVE mg/dL
Ketones, ur: NEGATIVE mg/dL
Nitrite: NEGATIVE
Protein, ur: NEGATIVE mg/dL
Specific Gravity, Urine: 1.006 (ref 1.005–1.030)
pH: 6 (ref 5.0–8.0)

## 2018-09-11 LAB — TROPONIN I: Troponin I: <0.03 ng/mL (ref <0.03)

## 2018-09-11 NOTE — ED Triage Notes (Signed)
Pt presents from home via [facility] with c/o epigastric abdominal pain. Sudden onset after eating at 4pm. Pt also c/o lump in vagina that has been present for several months. Pt took nitro at home. Pt temp 99.9 for ems. Pt states she has no known cardiac hx. Right bundle branch block on 12 lead for ems. 20G in left AC placed prior to arrival.

## 2018-09-11 NOTE — ED Provider Notes (Addendum)
[facility] Provider Note ____________________________________________   First MD Initiated Contact with Patient 09/11/18 2201     (approximate)  I have reviewed the triage vital signs and the nursing notes.   HISTORY  Chief Complaint Abdominal Pain    HPI Karla Little is a 85 y.o. female with PMH as noted below who presents primarily with epigastric abdominal pain, acute onset around 4 PM today, and lasting for about 4 hours.  She states she initially took some antacids with no improvement.  She later took nitroglycerin and the pain did improve.  She reports some associated nausea but no vomiting.  She has had diarrhea but states that this has been present for some time.  She denies chest pain, shortness of breath, or fever.  In addition, the patient reports a palpable vaginal mass that has been present for a few months.  She had gotten a referral to see an OB/GYN but then canceled the appointment because she was concerned about exposing herself to COVID-19.  Past Medical History:  Diagnosis Date  . Chronic kidney disease   . Hyperlipidemia   . Hypertension   . Hypothyroidism   . Osteoporosis   . Renal insufficiency   . Vertigo     Patient Active Problem List   Diagnosis Date Noted  . Vaginal lesion 07/02/2018  . Advanced care planning/counseling discussion 06/24/2017  . Atrophy of right kidney 03/16/2015  . Chronic kidney disease, stage 4, severely decreased GFR (HCC) 03/16/2015  . Shoulder bursitis 03/16/2015  . Hypothyroidism   . Hyperlipidemia   . Hypertension     Past Surgical History:  Procedure Laterality Date  . BREAST BIOPSY    . COLONOSCOPY W/ POLYPECTOMY    . COLONOSCOPY WITH PROPOFOL N/A 06/02/2015   Procedure: COLONOSCOPY WITH PROPOFOL;  Surgeon: Manya Silvas, MD;  Location: [facility];  Service: Endoscopy;  Laterality: N/A;    Prior to Admission medications   Medication Sig Start Date End  Date Taking? Authorizing Provider  albuterol (PROVENTIL HFA;VENTOLIN HFA) 108 (90 Base) MCG/ACT inhaler Inhale 2 puffs into the lungs every 6 (six) hours as needed for wheezing or shortness of breath. 07/15/18   Lavonia Drafts, MD  amLODipine (NORVASC) 10 MG tablet Take 1 tablet (10 mg total) by mouth daily. 07/02/18   [name], MD  calcitRIOL (ROCALTROL) 0.25 MCG capsule Take 0.25 mcg by mouth. 11/24/15   [provider]  hydrALAZINE (APRESOLINE) 25 MG tablet Take 2 tablets (50 mg total) by mouth daily. 07/02/18   [name], MD  levothyroxine (SYNTHROID, LEVOTHROID) 50 MCG tablet Take 1 tablet (50 mcg total) by mouth daily. 07/02/18   [name], MD  lisinopril (PRINIVIL,ZESTRIL) 10 MG tablet Take 1 tablet (10 mg total) by mouth daily. 07/02/18   [name], MD  nitroGLYCERIN (NITROSTAT) 0.4 MG SL tablet Place 0.4 mg under the tongue every 5 (five) minutes as needed for chest pain.    [provider]  sodium bicarbonate 650 MG tablet  02/25/15   [provider]  vitamin B-12 (CYANOCOBALAMIN) 250 MCG tablet Take 250 mcg by mouth daily.    [provider]  Vitamin D, Ergocalciferol, (DRISDOL) 50000 UNITS CAPS capsule Take 50,000 Units by mouth daily.     [provider]  vitamin E 200 UNIT capsule Take 200 Units by mouth daily.    [provider]    Allergies Amoxicillin  Family History  Problem Relation Age of  Onset  . Hypertension Mother   . Heart disease Father   . Cancer Sister        breast    Social History Social History   Tobacco Use  . Smoking status: Never Smoker  . Smokeless tobacco: Never Used  Substance Use Topics  . Alcohol use: No  . Drug use: No    Review of Systems  Constitutional: No fever/chills. Eyes: No redness. ENT: No sore throat. Cardiovascular: Denies chest pain. Respiratory: Denies shortness of breath. Gastrointestinal: No vomiting or diarrhea. Genitourinary: Negative for  dysuria.  Musculoskeletal: Negative for back pain. Skin: Negative for rash. Neurological: Negative for headache.   ____________________________________________   PHYSICAL EXAM:  VITAL SIGNS: ED Triage Vitals  Enc Vitals Group     BP 09/11/18 2200 (!) 154/63     Pulse Rate 09/11/18 2200 77     Resp 09/11/18 2200 17     Temp 09/11/18 2145 98.5 F (36.9 C)     Temp Source 09/11/18 2145 Oral     SpO2 09/11/18 2200 94 %     Weight 09/11/18 2146 170 lb (77.1 kg)     Height 09/11/18 2146 5\' 9"  (1.753 m)     Head Circumference --      Peak Flow --      Pain Score 09/11/18 2146 0     Pain Loc --      Pain Edu? --      Excl. in [place]? --     Constitutional: Alert and oriented.  Relatively well appearing and in no acute distress. Eyes: Conjunctivae are normal.  No scleral icterus. Head: Atraumatic. Nose: No congestion/rhinnorhea. Mouth/Throat: Mucous membranes are moist.   Neck: Normal range of motion.  Cardiovascular: Normal rate, regular rhythm.  Good peripheral circulation. Respiratory: Normal respiratory effort.  No retractions.  Gastrointestinal: Soft with mild epigastric discomfort but no focal tenderness.  No distention.  Genitourinary: No flank tenderness.  No vaginal lesions.  Palpable firm mass in the introitus just inferior to the urethral opening. Musculoskeletal: No lower extremity edema.  Extremities warm and well perfused.  Neurologic:  Normal speech and language. No gross focal neurologic deficits are appreciated.  Skin:  Skin is warm and dry. No rash noted. Psychiatric: Mood and affect are normal. Speech and behavior are normal.  ____________________________________________   LABS (all labs ordered are listed, but only abnormal results are displayed)  Labs Reviewed  CBC WITH DIFFERENTIAL/PLATELET - Abnormal; Notable for the following components:      Result Value   Lymphs Abs 0.4 (*)    All other components within normal limits  URINALYSIS, COMPLETE (UACMP)  WITH MICROSCOPIC - Abnormal; Notable for the following components:   Color, Urine STRAW (*)    APPearance CLEAR (*)    Hgb urine dipstick SMALL (*)    Leukocytes,Ua TRACE (*)    Bacteria, UA MANY (*)    All other components within normal limits  BASIC METABOLIC PANEL  HEPATIC FUNCTION PANEL  LIPASE, BLOOD  TROPONIN I   ____________________________________________  EKG  ED ECG REPORT I, Arta Silence, the attending physician, personally viewed and interpreted this ECG.  Date: 09/11/2018 EKG Time: 2147 Rate: 81 Rhythm: normal sinus rhythm QRS Axis: normal Intervals: RBBB, L PFB ST/T Wave abnormalities: Nonspecific anterior ST abnormalities Narrative Interpretation: Nonspecific abnormalities with no significant change when compared to EKG of 09/14/2016; no evidence of acute ischemia  ____________________________________________  RADIOLOGY  US pelvis: Pending  ____________________________________________   PROCEDURES  Procedure(s) performed:  No  Procedures  Critical Care performed: No ____________________________________________   INITIAL IMPRESSION / ASSESSMENT AND PLAN / ED COURSE  Pertinent labs & imaging results that were available during my care of the patient were reviewed by me and considered in my medical decision making (see chart for details).  85 year old female with PMH as noted above presents with an episode of epigastric pain this afternoon after eating which lasted about 4 hours and is now almost completely resolved.  She had associated nausea but no vomiting.  She reports some chronic diarrhea.  In addition, the patient has a palpable mass at her vagina which has been present for a few months but she is concerned about.  She states that she did not follow-up with an OB/GYN as an outpatient because of her concerns for pressure to COVID-19 at the hospital.  On exam, the patient is relatively well-appearing.  Her vital signs are normal except for  hypertension.  The abdomen is soft with no focal tenderness.  There is a palpable firm mass just inside the introitus but no vaginal or labial lesions.  In terms of the epigastric pain, overall I suspect most likely gastritis or PUD.  Given the patient's age and comorbidities ACS is also in the differential although less likely.  Given the duration of pain of more than 6 hours we will obtain a single troponin.  I am also considering pancreatitis or other hepatobiliary cause.  The vaginal lesion is consistent with a possible fibroid although malignant masses are possible.  Given the current COVID-19 outbreak which may be affecting the patient's ability to get timely outpatient follow-up, I will obtain a pelvic ultrasound in the ED to further evaluate.  If the lab work-up is unremarkable and the patient continues to feel well and is tolerating p.o., I expect discharge home.  ----------------------------------------- 11:28 PM on 09/11/2018 -----------------------------------------  Lab work-up and ultrasound are pending.  I am signing the patient out to the oncoming physician Dr. Beather Arbour.  _______  [name] was evaluated in Emergency Department on 09/11/2018 for the symptoms described in the history of present illness. She was evaluated in the context of the global COVID-19 pandemic, which necessitated consideration that the patient might be at risk for infection with the SARS-CoV-2 virus that causes COVID-19. Institutional protocols and algorithms that pertain to the evaluation of patients at risk for COVID-19 are in a state of rapid change based on information released by regulatory bodies including the CDC and federal and state organizations. These policies and algorithms were followed during the patient's care in the ED.  ____________________________________________   FINAL CLINICAL IMPRESSION(S) / ED DIAGNOSES  Final diagnoses:  Uterine mass  Epigastric pain  Pelvic mass      NEW  MEDICATIONS STARTED DURING THIS VISIT:  New Prescriptions   No medications on file     Note:  This document was prepared using Dragon voice recognition software and may include unintentional dictation errors.    Arta Silence, MD 09/11/18 [facility] [place]    Arta Silence, MD 09/11/18 2329

## 2018-09-12 ENCOUNTER — Inpatient Hospital Stay: Payer: Medicare HMO

## 2018-09-12 ENCOUNTER — Emergency Department: Payer: Medicare HMO

## 2018-09-12 DIAGNOSIS — Z66 Do not resuscitate: Secondary | ICD-10-CM | POA: Diagnosis not present

## 2018-09-12 DIAGNOSIS — I10 Essential (primary) hypertension: Secondary | ICD-10-CM | POA: Diagnosis not present

## 2018-09-12 DIAGNOSIS — Z803 Family history of malignant neoplasm of breast: Secondary | ICD-10-CM | POA: Diagnosis not present

## 2018-09-12 DIAGNOSIS — R1012 Left upper quadrant pain: Secondary | ICD-10-CM | POA: Diagnosis not present

## 2018-09-12 DIAGNOSIS — R740 Nonspecific elevation of levels of transaminase and lactic acid dehydrogenase [LDH]: Secondary | ICD-10-CM | POA: Diagnosis not present

## 2018-09-12 DIAGNOSIS — Z79899 Other long term (current) drug therapy: Secondary | ICD-10-CM | POA: Diagnosis not present

## 2018-09-12 DIAGNOSIS — I129 Hypertensive chronic kidney disease with stage 1 through stage 4 chronic kidney disease, or unspecified chronic kidney disease: Secondary | ICD-10-CM | POA: Diagnosis not present

## 2018-09-12 DIAGNOSIS — M810 Age-related osteoporosis without current pathological fracture: Secondary | ICD-10-CM | POA: Diagnosis not present

## 2018-09-12 DIAGNOSIS — K859 Acute pancreatitis without necrosis or infection, unspecified: Secondary | ICD-10-CM | POA: Diagnosis present

## 2018-09-12 DIAGNOSIS — N898 Other specified noninflammatory disorders of vagina: Secondary | ICD-10-CM | POA: Diagnosis not present

## 2018-09-12 DIAGNOSIS — Z8249 Family history of ischemic heart disease and other diseases of the circulatory system: Secondary | ICD-10-CM | POA: Diagnosis not present

## 2018-09-12 DIAGNOSIS — K7689 Other specified diseases of liver: Secondary | ICD-10-CM | POA: Diagnosis not present

## 2018-09-12 DIAGNOSIS — Z880 Allergy status to penicillin: Secondary | ICD-10-CM | POA: Diagnosis not present

## 2018-09-12 DIAGNOSIS — E039 Hypothyroidism, unspecified: Secondary | ICD-10-CM | POA: Diagnosis present

## 2018-09-12 DIAGNOSIS — R1900 Intra-abdominal and pelvic swelling, mass and lump, unspecified site: Secondary | ICD-10-CM | POA: Diagnosis present

## 2018-09-12 DIAGNOSIS — K8020 Calculus of gallbladder without cholecystitis without obstruction: Secondary | ICD-10-CM | POA: Diagnosis not present

## 2018-09-12 DIAGNOSIS — Z79890 Hormone replacement therapy (postmenopausal): Secondary | ICD-10-CM | POA: Diagnosis not present

## 2018-09-12 DIAGNOSIS — R3989 Other symptoms and signs involving the genitourinary system: Secondary | ICD-10-CM | POA: Diagnosis not present

## 2018-09-12 DIAGNOSIS — D259 Leiomyoma of uterus, unspecified: Secondary | ICD-10-CM | POA: Diagnosis not present

## 2018-09-12 DIAGNOSIS — K851 Biliary acute pancreatitis without necrosis or infection: Secondary | ICD-10-CM | POA: Diagnosis not present

## 2018-09-12 DIAGNOSIS — N183 Chronic kidney disease, stage 3 (moderate): Secondary | ICD-10-CM | POA: Diagnosis not present

## 2018-09-12 DIAGNOSIS — R1013 Epigastric pain: Secondary | ICD-10-CM | POA: Diagnosis not present

## 2018-09-12 DIAGNOSIS — E785 Hyperlipidemia, unspecified: Secondary | ICD-10-CM | POA: Diagnosis not present

## 2018-09-12 LAB — COMPREHENSIVE METABOLIC PANEL
ALT: 343 U/L — ABNORMAL HIGH (ref 0–44)
AST: 424 U/L — ABNORMAL HIGH (ref 15–41)
Albumin: 3.7 g/dL (ref 3.5–5.0)
Alkaline Phosphatase: 146 U/L — ABNORMAL HIGH (ref 38–126)
Anion gap: 9 (ref 5–15)
BUN: 26 mg/dL — ABNORMAL HIGH (ref 8–23)
CO2: 18 mmol/L — ABNORMAL LOW (ref 22–32)
Calcium: 9.3 mg/dL (ref 8.9–10.3)
Chloride: 109 mmol/L (ref 98–111)
Creatinine, Ser: 1.48 mg/dL — ABNORMAL HIGH (ref 0.44–1.00)
GFR calc Af Amer: 38 mL/min — ABNORMAL LOW (ref >60)
GFR calc non Af Amer: 32 mL/min — ABNORMAL LOW (ref >60)
Glucose, Bld: 119 mg/dL — ABNORMAL HIGH (ref 70–99)
Potassium: 4 mmol/L (ref 3.5–5.1)
Sodium: 136 mmol/L (ref 135–145)
Total Bilirubin: 0.4 mg/dL (ref 0.3–1.2)
Total Protein: 7.1 g/dL (ref 6.5–8.1)

## 2018-09-12 LAB — CBC
HCT: 37.7 % (ref 36.0–46.0)
Hemoglobin: 12.7 g/dL (ref 12.0–15.0)
MCH: 31.7 pg (ref 26.0–34.0)
MCHC: 33.7 g/dL (ref 30.0–36.0)
MCV: 94 fL (ref 80.0–100.0)
Platelets: 219 10*3/uL (ref 150–400)
RBC: 4.01 MIL/uL (ref 3.87–5.11)
RDW: 12.6 % (ref 11.5–15.5)
WBC: 4.9 10*3/uL (ref 4.0–10.5)
nRBC: 0 % (ref 0.0–0.2)

## 2018-09-12 LAB — LIPASE, BLOOD
Lipase: 3433 U/L — ABNORMAL HIGH (ref 11–51)
Lipase: 2727 U/L — ABNORMAL HIGH (ref 11–51)

## 2018-09-12 MED ORDER — LORAZEPAM 2 MG/ML IJ SOLN
0.5000 mg | Freq: Once | INTRAMUSCULAR | Status: AC | PRN
  Administered 2018-09-12: 12:00:00 0.5 mg via INTRAVENOUS
  Filled 2018-09-12: qty 1

## 2018-09-12 MED ORDER — SODIUM CHLORIDE 0.9 % IV SOLN
1.0000 g | INTRAVENOUS | Status: DC
  Administered 2018-09-12 – 2018-09-13 (×2): 1 g via INTRAVENOUS
  Filled 2018-09-12: qty 1
  Filled 2018-09-12 (×2): qty 10

## 2018-09-12 MED ORDER — ENOXAPARIN SODIUM 30 MG/0.3ML ~~LOC~~ SOLN
30.0000 mg | SUBCUTANEOUS | Status: DC
  Administered 2018-09-12 – 2018-09-13 (×2): 30 mg via SUBCUTANEOUS
  Filled 2018-09-12 (×2): qty 0.3

## 2018-09-12 MED ORDER — MORPHINE SULFATE (PF) 2 MG/ML IV SOLN: 2.0000 mg | INTRAVENOUS | Status: DC | PRN

## 2018-09-12 MED ORDER — CYANOCOBALAMIN 500 MCG PO TABS
250.0000 ug | ORAL_TABLET | Freq: Every day | ORAL | Status: DC
  Administered 2018-09-12 – 2018-09-13 (×2): 250 ug via ORAL
  Filled 2018-09-12 (×3): qty 1

## 2018-09-12 MED ORDER — SODIUM CHLORIDE 0.9 % IV SOLN
INTRAVENOUS | Status: DC
  Administered 2018-09-12 – 2018-09-14 (×5): via INTRAVENOUS

## 2018-09-12 MED ORDER — SODIUM BICARBONATE 650 MG PO TABS
650.0000 mg | ORAL_TABLET | Freq: Every day | ORAL | Status: DC
  Administered 2018-09-12 – 2018-09-13 (×2): 650 mg via ORAL
  Filled 2018-09-12 (×4): qty 1

## 2018-09-12 MED ORDER — LEVOTHYROXINE SODIUM 50 MCG PO TABS
50.0000 ug | ORAL_TABLET | Freq: Every day | ORAL | Status: DC
  Administered 2018-09-12 – 2018-09-14 (×3): 50 ug via ORAL
  Filled 2018-09-12 (×3): qty 1

## 2018-09-12 MED ORDER — ENOXAPARIN SODIUM 40 MG/0.4ML ~~LOC~~ SOLN
30.0000 mg | SUBCUTANEOUS | Status: DC
  Filled 2018-09-12: qty 0.4

## 2018-09-12 MED ORDER — ONDANSETRON HCL 4 MG/2ML IJ SOLN: 4.0000 mg | Freq: Four times a day (QID) | INTRAMUSCULAR | Status: DC | PRN

## 2018-09-12 MED ORDER — NITROGLYCERIN 0.4 MG SL SUBL: 0.4000 mg | SUBLINGUAL_TABLET | SUBLINGUAL | Status: DC | PRN

## 2018-09-12 MED ORDER — METRONIDAZOLE IN NACL 5-0.79 MG/ML-% IV SOLN
500.0000 mg | Freq: Once | INTRAVENOUS | Status: AC
  Administered 2018-09-12: 500 mg via INTRAVENOUS
  Filled 2018-09-12: qty 100

## 2018-09-12 MED ORDER — VITAMIN D (ERGOCALCIFEROL) 1.25 MG (50000 UNIT) PO CAPS: 50000.0000 [IU] | ORAL_CAPSULE | ORAL | Status: DC

## 2018-09-12 MED ORDER — MAGNESIUM HYDROXIDE 400 MG/5ML PO SUSP: 30.0000 mL | Freq: Every day | ORAL | Status: DC | PRN

## 2018-09-12 MED ORDER — ONDANSETRON HCL 4 MG PO TABS: 4.0000 mg | ORAL_TABLET | Freq: Four times a day (QID) | ORAL | Status: DC | PRN

## 2018-09-12 MED ORDER — SODIUM CHLORIDE 0.9 % IV BOLUS
1000.0000 mL | Freq: Once | INTRAVENOUS | Status: AC
  Administered 2018-09-12: 1000 mL via INTRAVENOUS

## 2018-09-12 MED ORDER — SODIUM CHLORIDE 0.9 % IV SOLN
INTRAVENOUS | Status: DC | PRN
  Administered 2018-09-12: 250 mL via INTRAVENOUS

## 2018-09-12 MED ORDER — OXYCODONE HCL 5 MG PO TABS
5.0000 mg | ORAL_TABLET | ORAL | Status: DC | PRN
  Administered 2018-09-13: 11:00:00 5 mg via ORAL
  Filled 2018-09-12 (×2): qty 1

## 2018-09-12 MED ORDER — HYDRALAZINE HCL 50 MG PO TABS
50.0000 mg | ORAL_TABLET | Freq: Every day | ORAL | Status: DC
  Administered 2018-09-12 – 2018-09-13 (×2): 50 mg via ORAL
  Filled 2018-09-12 (×3): qty 1

## 2018-09-12 MED ORDER — AMLODIPINE BESYLATE 10 MG PO TABS
10.0000 mg | ORAL_TABLET | Freq: Every day | ORAL | Status: DC
  Administered 2018-09-12 – 2018-09-13 (×2): 10 mg via ORAL
  Filled 2018-09-12 (×3): qty 1

## 2018-09-12 MED ORDER — ALBUTEROL SULFATE (2.5 MG/3ML) 0.083% IN NEBU: 2.5000 mg | INHALATION_SOLUTION | Freq: Four times a day (QID) | RESPIRATORY_TRACT | Status: DC | PRN

## 2018-09-12 MED ORDER — IOHEXOL 240 MG/ML SOLN: 25.0000 mL | INTRAMUSCULAR | Status: DC

## 2018-09-12 NOTE — Progress Notes (Signed)
Same day note  #1.  Acute gallstone pancreatitis 2.  Elevated transaminases 3.   Uterine fibroids and increased endometrial thickness. 4.  Possible UTI. 5.  Stage III-IV chronic kidney disease.  6.  Hypertension.   Start soft diet with improved pain Continue IV fluids and IV antibiotics Gynecology consulted Will request surgery evaluation for cholecystectomy

## 2018-09-12 NOTE — Consult Note (Signed)
Lucilla Lame, MD [facility]  [address] [place], [place] [address] Phone: [phone] Fax : [phone]  Consultation  Referring Provider:     Dr. Darvin Neighbours Primary Care Physician:  [name], MD Primary Gastroenterologist:  Dr. Vira Agar         Reason for Consultation:     Pancreatitis  Date of Admission:  09/11/2018 Date of Consultation:  09/12/2018         HPI:   Karla Little is a 85 y.o. female who was admitted with pancreatitis and abnormal liver enzymes although the bilirubin was normal.  The patient was thought to have gallstone pancreatitis a CT scan of the abdomen was ordered.  The CT showed acute edematous pancreatitis with peripancreatic fluid and a large gallstone with mild gallbladder wall thickening.  The common bile duct measured 8 mm which was the upper limit of normal for her age.  The patient reports that she was having some epigastric discomfort which brought her to the hospital.  She also reports that she has been following up with Dr. Vira Agar for her colonoscopies every 5 years but was told that her last colonoscopy that she did not need any further procedures.  The patient has a history of chronic kidney disease hypertension hypothyroidism osteoporosis and dyslipidemia. Patient's lipase on admission was 3433 which went down to 2727 today.  The patient's previous lipase back in April 2018 was normal at 29.  The patient's liver enzymes had been normal in January but on admission yesterday the AST was 741 with the ALT being 371 and the alkaline phosphatase being 155.  The patient's bilirubin was normal at 0.7.  Today the AST has gone down to 324 with the ALT going down to 343 and the patient's alkaline phosphatase came down to 146.  The patient's bilirubin was also still normal at 0.4.  Past Medical History:  Diagnosis Date   Chronic kidney disease    Hyperlipidemia    Hypertension    Hypothyroidism    Osteoporosis    Renal insufficiency     Vertigo     Past Surgical History:  Procedure Laterality Date   BREAST BIOPSY     COLONOSCOPY W/ POLYPECTOMY     COLONOSCOPY WITH PROPOFOL N/A 06/02/2015   Procedure: COLONOSCOPY WITH PROPOFOL;  Surgeon: Manya Silvas, MD;  Location: [facility];  Service: Endoscopy;  Laterality: N/A;    Prior to Admission medications   Medication Sig Start Date End Date Taking? Authorizing Provider  albuterol (PROVENTIL HFA;VENTOLIN HFA) 108 (90 Base) MCG/ACT inhaler Inhale 2 puffs into the lungs every 6 (six) hours as needed for wheezing or shortness of breath. 07/15/18   Lavonia Drafts, MD  amLODipine (NORVASC) 10 MG tablet Take 1 tablet (10 mg total) by mouth daily. 07/02/18   [name], MD  calcitRIOL (ROCALTROL) 0.25 MCG capsule Take 0.25 mcg by mouth. 11/24/15   [provider]  hydrALAZINE (APRESOLINE) 25 MG tablet Take 2 tablets (50 mg total) by mouth daily. 07/02/18   [name], MD  levothyroxine (SYNTHROID, LEVOTHROID) 50 MCG tablet Take 1 tablet (50 mcg total) by mouth daily. 07/02/18   [name], MD  lisinopril (PRINIVIL,ZESTRIL) 10 MG tablet Take 1 tablet (10 mg total) by mouth daily. 07/02/18   [name], MD  nitroGLYCERIN (NITROSTAT) 0.4 MG SL tablet Place 0.4 mg under the tongue every 5 (five) minutes as needed for chest pain.    [provider]  sodium  bicarbonate 650 MG tablet  02/25/15   [provider]  vitamin B-12 (CYANOCOBALAMIN) 250 MCG tablet Take 250 mcg by mouth daily.    [provider]  Vitamin D, Ergocalciferol, (DRISDOL) 50000 UNITS CAPS capsule Take 50,000 Units by mouth daily.     [provider]  vitamin E 200 UNIT capsule Take 200 Units by mouth daily.    [provider]    Family History  Problem Relation Age of Onset   Hypertension Mother    Heart disease Father    Cancer Sister        breast     Social History   Tobacco Use   Smoking status: Never Smoker   Smokeless  tobacco: Never Used  Substance Use Topics   Alcohol use: No   Drug use: No    Allergies as of 09/11/2018 - Review Complete 09/11/2018  Allergen Reaction Noted   Amoxicillin Rash 12/25/2017    Review of Systems:    All systems reviewed and negative except where noted in HPI.   Physical Exam:  Vital signs in last 24 hours: Temp:  [98.5 F (36.9 C)-98.7 F (37.1 C)] 98.7 F (37.1 C) (04/10 0255) Pulse Rate:  [66-77] 72 (04/10 0255) Resp:  [16-20] 20 (04/10 0255) BP: (146-154)/(57-70) 146/58 (04/10 0255) SpO2:  [94 %-99 %] 99 % (04/10 0255) Weight:  [75.3 kg-77.1 kg] 75.3 kg (04/10 0255)   General:   Pleasant, cooperative in NAD Head:  Normocephalic and atraumatic. Eyes:   No icterus.   Conjunctiva pink. PERRLA. Ears:  Normal auditory acuity. Neck:  Supple; no masses or thyroidomegaly Lungs: Respirations even and unlabored. Lungs clear to auscultation bilaterally.   No wheezes, crackles, or rhonchi.  Heart:  Regular rate and rhythm;  Without murmur, clicks, rubs or gallops Abdomen:  Soft, nondistended, nontender. Normal bowel sounds. No appreciable masses or hepatomegaly.  No rebound or guarding.  Rectal:  Not performed. Msk:  Symmetrical without gross deformities.    Extremities:  Without edema, cyanosis or clubbing. Neurologic:  Alert and oriented x3;  grossly normal neurologically. Skin:  Intact without significant lesions or rashes. Cervical Nodes:  No significant cervical adenopathy. Psych:  Alert and cooperative. Normal affect.  LAB RESULTS: Recent Labs    09/11/18 2148 09/12/18 0342  WBC 5.9 4.9  HGB 12.5 12.7  HCT 37.4 37.7  PLT 189 219   BMET Recent Labs    09/11/18 2148 09/12/18 0342  NA 134* 136  K 4.2 4.0  CL 107 109  CO2 17* 18*  GLUCOSE 155* 119*  BUN 30* 26*  CREATININE 1.62* 1.48*  CALCIUM 9.9 9.3   LFT Recent Labs    09/11/18 2148 09/12/18 0342  PROT 7.3 7.1  ALBUMIN 3.8 3.7  AST 741* 424*  ALT 371* 343*  ALKPHOS 155* 146*    BILITOT 0.7 0.4  BILIDIR 0.3*  --   IBILI 0.4  --    PT/INR No results for input(s): LABPROT, INR in the last 72 hours.  STUDIES: Ct Abdomen Pelvis Wo Contrast  Result Date: 09/12/2018 CLINICAL DATA:  Epigastric abdominal pain. Pancreatitis. EXAM: CT ABDOMEN AND PELVIS WITHOUT CONTRAST TECHNIQUE: Multidetector CT imaging of the abdomen and pelvis was performed following the standard protocol without IV contrast. COMPARISON:  Right upper quadrant ultrasound performed concurrently. Abdominal CT 09/23/2016 FINDINGS: Lower chest: Possible small diaphragmatic nodule in the left lower lobe. No pleural fluid. Moderate hiatal hernia. Hepatobiliary: Small cyst in the right lobe of the liver. Large gallstones  in the gallbladder, central low density consistent with nitrogen. Mild gallbladder wall thickening. Common bile duct is 8 mm, upper normal for age. Pancreas: Peripancreatic fat stranding most prominent about the head, uncinate process, and body. Peripancreatic fluid with fluid tracking in the right retroperitoneum and pericolic gutter. No organized peripancreatic fluid collection. Cannot assess for necrosis due to lack contrast. No ductal dilatation. Spleen: Calcified granuloma. Normal in size. Adrenals/Urinary Tract: Right renal atrophy with compensatory hypertrophy of the left kidney. Lobulated left renal contours. Mild left perinephric edema which is nonspecific. Cyst in the posterior mid left kidney which not well characterized in the absence of contrast. No hydronephrosis. Urinary bladder is near completely empty. Small focus of air in the bladder may be due to recent instrumentation. No adrenal nodule. Stomach/Bowel: Bowel evaluation is limited in the absence of enteric contrast. Moderate hiatal hernia. Wall thickening of the duodenum is likely reactive secondary to pancreatic inflammation. No other small bowel wall thickening or inflammatory change. No bowel obstruction. Colonic diverticulosis,  prominent in the sigmoid colon. No evidence of diverticulitis. Moderate colonic stool burden. No colonic inflammation. Normal appendix. Vascular/Lymphatic: Aortic atherosclerosis. No aneurysm. No abdominopelvic adenopathy. Reproductive: Uterine fibroids and pelvic ultrasound earlier this day are not well demonstrated on noncontrast exam. Ovaries not confidently visualized, no adnexal mass. Other: Peripancreatic edema with free fluid which tracks into the right pericolic gutter. No free air. Surgical clips adjacent to the left iliac vessels. Musculoskeletal: There are no acute or suspicious osseous abnormalities. IMPRESSION: 1. Acute edematous pancreatitis. Peripancreatic fluid tracks in the right pericolic gutter. No organized peripancreatic fluid collection. 2. Large gallstones with mild gallbladder wall thickening. Common bile duct measures 8 mm, upper normal for age. 3. Moderate hiatal hernia. 4. Colonic diverticulosis without diverticulitis. 5. Possible small pulmonary nodule abutting the left diaphragm. Aortic Atherosclerosis (ICD10-I70.0). Electronically Signed   By: Keith Rake M.D.   On: 09/12/2018 02:59   US Pelvis Transvanginal Non-ob (tv Only)  Result Date: 09/12/2018 CLINICAL DATA:  Initial evaluation for uterine mass. EXAM: TRANSABDOMINAL AND TRANSVAGINAL ULTRASOUND OF PELVIS TECHNIQUE: Both transabdominal and transvaginal ultrasound examinations of the pelvis were performed. Transabdominal technique was performed for global imaging of the pelvis including uterus, ovaries, adnexal regions, and pelvic cul-de-sac. It was necessary to proceed with endovaginal exam following the transabdominal exam to visualize the uterus, endometrium, and ovaries. COMPARISON:  Prior CT from 09/23/2016. FINDINGS: Uterus Measurements: 7.2 x 3.3 x 4.3 cm = volume: 52.4 mL. 1.5 x 1.6 x 1.4 cm intramural fibroid present at the left uterine fundus. Additional 0.8 x 0.9 x 0.7 cm submucosal fibroid present at the mid  uterine body. Endometrium Thickness: 8.3 mm.  No focal abnormality visualized. Right ovary Not visualized.  No adnexal mass. Left ovary Not visualized.  No adnexal mass. Other findings No abnormal free fluid. IMPRESSION: 1. Two separate uterine fibroids as detailed above. 2. Mildly thickened endometrium measuring up to 8.3 mm in thickness. Endometrial thickness is considered abnormal for an asymptomatic post-menopausal female. Endometrial sampling should be considered to exclude carcinoma. 3. Nonvisualization of the ovaries. No adnexal mass or abnormal free fluid. Electronically Signed   By: Jeannine Boga M.D.   On: 09/12/2018 00:55   US Pelvis (transabdominal Only)  Result Date: 09/12/2018 CLINICAL DATA:  Initial evaluation for uterine mass. EXAM: TRANSABDOMINAL AND TRANSVAGINAL ULTRASOUND OF PELVIS TECHNIQUE: Both transabdominal and transvaginal ultrasound examinations of the pelvis were performed. Transabdominal technique was performed for global imaging of the pelvis including uterus, ovaries, adnexal regions, and  pelvic cul-de-sac. It was necessary to proceed with endovaginal exam following the transabdominal exam to visualize the uterus, endometrium, and ovaries. COMPARISON:  Prior CT from 09/23/2016. FINDINGS: Uterus Measurements: 7.2 x 3.3 x 4.3 cm = volume: 52.4 mL. 1.5 x 1.6 x 1.4 cm intramural fibroid present at the left uterine fundus. Additional 0.8 x 0.9 x 0.7 cm submucosal fibroid present at the mid uterine body. Endometrium Thickness: 8.3 mm.  No focal abnormality visualized. Right ovary Not visualized.  No adnexal mass. Left ovary Not visualized.  No adnexal mass. Other findings No abnormal free fluid. IMPRESSION: 1. Two separate uterine fibroids as detailed above. 2. Mildly thickened endometrium measuring up to 8.3 mm in thickness. Endometrial thickness is considered abnormal for an asymptomatic post-menopausal female. Endometrial sampling should be considered to exclude carcinoma. 3.  Nonvisualization of the ovaries. No adnexal mass or abnormal free fluid. Electronically Signed   By: Jeannine Boga M.D.   On: 09/12/2018 00:55   US Abdomen Limited Ruq  Result Date: 09/12/2018 CLINICAL DATA:  Epigastric pain for 1 day.  Pancreatitis. EXAM: ULTRASOUND ABDOMEN LIMITED RIGHT UPPER QUADRANT COMPARISON:  Concurrent abdominal CT. FINDINGS: Gallbladder: Large shadowing gallstones partially obscure the posterior wall, largest stone measures 4 cm. There is gallbladder wall thickening of 4 mm. No sonographic Murphy sign noted by sonographer. Common bile duct: Diameter: 6 mm, normal. Liver: 1.1 cm cyst in the right hepatic lobe. Within normal limits in parenchymal echogenicity. Portal vein is patent on color Doppler imaging with normal direction of blood flow towards the liver. IMPRESSION: 1. Large shadowing gallstones in the gallbladder which obscure the posterior wall. There is gallbladder wall thickening of 4 mm. Negative sonographic Murphy sign. 2. Incidental small right hepatic cyst. Electronically Signed   By: Keith Rake M.D.   On: 09/12/2018 02:50      Impression / Plan:   Assessment: Active Problems:   Acute pancreatitis   Karla Little is a 85 y.o. y/o female with pancreatitis with normal bilirubin.  The patient's liver enzymes have been elevated and the CT scan showed the common bile duct to be the upper limit of normal without any filling defects seen.  The patient states that her abdominal pain is better today.  Plan:  This patient has pancreatitis which appears to be improving with decreased lipase and decreased abdominal pain.  It is strange that the patient's bilirubin is normal.  The patient denies any new medications or use of NSAIDs.  The patient imaging did not show a dilated common bile duct or common bile duct stones.  I recommend that the patient undergo an MRCP to look for any filling defects in the common bile duct before I subject this woman to an  ERCP with it inherent dangers.  It is also encouraging that the patient's liver enzymes are improving.  The patient has been explained the plan and agrees with it.  Thank you for involving me in the care of this patient.      LOS: 0 days   Lucilla Lame, MD  09/12/2018, 12:29 PM    Note: This dictation was prepared with Dragon dictation along with smaller phrase technology. Any transcriptional errors that result from this process are unintentional.

## 2018-09-12 NOTE — ED Notes (Signed)
Called daughter and spoke to son in law and notified him pt is getting admitted

## 2018-09-12 NOTE — ED Notes (Signed)
ED TO INPATIENT HANDOFF REPORT  ED Nurse Name and Phone #:  Mitchell Heir  S Name/Age/Gender Karla Little 85 y.o. female Room/Bed: ED16A/ED16A  Code Status   Code Status: Full Code  Home/SNF/Other home A&O x 4 baseline  Triage Complete: Triage complete  Chief Complaint Abdominal pain  Triage Note Pt presents from home via acems with c/o epigastric abdominal pain. Sudden onset after eating at 4pm. Pt also c/o lump in vagina that has been present for several months. Pt took nitro at home. Pt temp 99.9 for ems. Pt states she has no known cardiac hx. Right bundle branch block on 12 lead for ems. 20G in left AC placed prior to arrival.   Allergies Allergies  Allergen Reactions  . Amoxicillin Rash    Level of Care/Admitting Diagnosis ED Disposition    ED Disposition Condition [facility] Area: [place] [[id number]]  Level of Care: Med-Surg [16]  Diagnosis: Acute pancreatitis [577.0.ICD-9-CM]  Admitting Physician: Christel Mormon [[id number]]  Attending Physician: Christel Mormon [[id number]]  Estimated length of stay: past midnight tomorrow  Certification:: I certify this patient will need inpatient services for at least 2 midnights  PT Class (Do Not Modify): Inpatient [101]  PT Acc Code (Do Not Modify): Private [1]       B Medical/Surgery History Past Medical History:  Diagnosis Date  . Chronic kidney disease   . Hyperlipidemia   . Hypertension   . Hypothyroidism   . Osteoporosis   . Renal insufficiency   . Vertigo    Past Surgical History:  Procedure Laterality Date  . BREAST BIOPSY    . COLONOSCOPY W/ POLYPECTOMY    . COLONOSCOPY WITH PROPOFOL N/A 06/02/2015   Procedure: COLONOSCOPY WITH PROPOFOL;  Surgeon: Manya Silvas, MD;  Location: [facility] ENDOSCOPY;  Service: Endoscopy;  Laterality: N/A;     A IV Location/Drains/Wounds Patient Lines/Drains/Airways Status   Active Line/Drains/Airways    None          Intake/Output  Last 24 hours No intake or output data in the 24 hours ending 09/12/18 0202  Labs/Imaging Results for orders placed or performed during the hospital encounter of 09/11/18 (from the past 48 hour(s))  Urinalysis, Complete w Microscopic     Status: Abnormal   Collection Time: 09/11/18  9:40 PM  Result Value Ref Range   Color, Urine STRAW (A) YELLOW   APPearance CLEAR (A) CLEAR   Specific Gravity, Urine 1.006 1.005 - 1.030   pH 6.0 5.0 - 8.0   Glucose, UA NEGATIVE NEGATIVE mg/dL   Hgb urine dipstick SMALL (A) NEGATIVE   Bilirubin Urine NEGATIVE NEGATIVE   Ketones, ur NEGATIVE NEGATIVE mg/dL   Protein, ur NEGATIVE NEGATIVE mg/dL   Nitrite NEGATIVE NEGATIVE   Leukocytes,Ua TRACE (A) NEGATIVE   RBC / HPF 0-5 0 - 5 RBC/hpf   WBC, UA 6-10 0 - 5 WBC/hpf   Bacteria, UA MANY (A) NONE SEEN   Squamous Epithelial / LPF 0-5 0 - 5   WBC Clumps PRESENT     Comment: Performed at [facility], [address] [place], [place] [address]  Basic metabolic panel     Status: Abnormal   Collection Time: 09/11/18  9:48 PM  Result Value Ref Range   Sodium 134 (L) 135 - 145 mmol/L   Potassium 4.2 3.5 - 5.1 mmol/L   Chloride 107 98 - 111 mmol/L   CO2 17 (L) 22 - 32 mmol/L  Glucose, Bld 155 (H) 70 - 99 mg/dL   BUN 30 (H) 8 - 23 mg/dL   Creatinine, Ser 1.62 (H) 0.44 - 1.00 mg/dL   Calcium 9.9 8.9 - 10.3 mg/dL   GFR calc non Af Amer 29 (L) >60 mL/min   GFR calc Af Amer 34 (L) >60 mL/min   Anion gap 10 5 - 15    Comment: Performed at [facility], [address] [place], [place] [address]  Hepatic function panel     Status: Abnormal   Collection Time: 09/11/18  9:48 PM  Result Value Ref Range   Total Protein 7.3 6.5 - 8.1 g/dL   Albumin 3.8 3.5 - 5.0 g/dL   AST 741 (H) 15 - 41 U/L   ALT 371 (H) 0 - 44 U/L   Alkaline Phosphatase 155 (H) 38 - 126 U/L   Total Bilirubin 0.7 0.3 - 1.2 mg/dL   Bilirubin, Direct 0.3 (H) 0.0 - 0.2 mg/dL   Indirect Bilirubin 0.4 0.3 - 0.9 mg/dL     Comment: Performed at [facility], [address] [place], [place] [address]  Lipase, blood     Status: Abnormal   Collection Time: 09/11/18  9:48 PM  Result Value Ref Range   Lipase 3,433 (H) 11 - 51 U/L    Comment: RESULTS CONFIRMED BY MANUAL DILUTION RDW Performed at [facility], [address] [place], [place] [address]   Troponin I - Once     Status: None   Collection Time: 09/11/18  9:48 PM  Result Value Ref Range   Troponin I <0.03 <0.03 ng/mL    Comment: Performed at [facility], [address] [place], [place] [address]  CBC with Differential     Status: Abnormal   Collection Time: 09/11/18  9:48 PM  Result Value Ref Range   WBC 5.9 4.0 - 10.5 K/uL   RBC 3.98 3.87 - 5.11 MIL/uL   Hemoglobin 12.5 12.0 - 15.0 g/dL   HCT 37.4 36.0 - 46.0 %   MCV 94.0 80.0 - 100.0 fL   MCH 31.4 26.0 - 34.0 pg   MCHC 33.4 30.0 - 36.0 g/dL   RDW 12.7 11.5 - 15.5 %   Platelets 189 150 - 400 K/uL    Comment: PLATELET CLUMPS NOTED ON SMEAR, UNABLE TO ESTIMATE   nRBC 0.0 0.0 - 0.2 %   Neutrophils Relative % 85 %   Neutro Abs 5.2 1.7 - 7.7 K/uL   Lymphocytes Relative 7 %   Lymphs Abs 0.4 (L) 0.7 - 4.0 K/uL   Monocytes Relative 8 %   Monocytes Absolute 0.5 0.1 - 1.0 K/uL   Eosinophils Relative 0 %   Eosinophils Absolute 0.0 0.0 - 0.5 K/uL   Basophils Relative 0 %   Basophils Absolute 0.0 0.0 - 0.1 K/uL   Immature Granulocytes 0 %   Abs Immature Granulocytes 0.01 0.00 - 0.07 K/uL    Comment: Performed at [facility], [address] [place], [place] [address]   US Pelvis Transvanginal Non-ob (tv Only)  Result Date: 09/12/2018 CLINICAL DATA:  Initial evaluation for uterine mass. EXAM: TRANSABDOMINAL AND TRANSVAGINAL ULTRASOUND OF PELVIS TECHNIQUE: Both transabdominal and transvaginal ultrasound examinations of the pelvis were performed. Transabdominal technique was performed for global imaging of the pelvis including uterus, ovaries, adnexal  regions, and pelvic cul-de-sac. It was necessary to proceed with endovaginal exam following the transabdominal exam to visualize the uterus, endometrium, and ovaries. COMPARISON:  Prior CT from  09/23/2016. FINDINGS: Uterus Measurements: 7.2 x 3.3 x 4.3 cm = volume: 52.4 mL. 1.5 x 1.6 x 1.4 cm intramural fibroid present at the left uterine fundus. Additional 0.8 x 0.9 x 0.7 cm submucosal fibroid present at the mid uterine body. Endometrium Thickness: 8.3 mm.  No focal abnormality visualized. Right ovary Not visualized.  No adnexal mass. Left ovary Not visualized.  No adnexal mass. Other findings No abnormal free fluid. IMPRESSION: 1. Two separate uterine fibroids as detailed above. 2. Mildly thickened endometrium measuring up to 8.3 mm in thickness. Endometrial thickness is considered abnormal for an asymptomatic post-menopausal female. Endometrial sampling should be considered to exclude carcinoma. 3. Nonvisualization of the ovaries. No adnexal mass or abnormal free fluid. Electronically Signed   By: Jeannine Boga M.D.   On: 09/12/2018 00:55   US Pelvis (transabdominal Only)  Result Date: 09/12/2018 CLINICAL DATA:  Initial evaluation for uterine mass. EXAM: TRANSABDOMINAL AND TRANSVAGINAL ULTRASOUND OF PELVIS TECHNIQUE: Both transabdominal and transvaginal ultrasound examinations of the pelvis were performed. Transabdominal technique was performed for global imaging of the pelvis including uterus, ovaries, adnexal regions, and pelvic cul-de-sac. It was necessary to proceed with endovaginal exam following the transabdominal exam to visualize the uterus, endometrium, and ovaries. COMPARISON:  Prior CT from 09/23/2016. FINDINGS: Uterus Measurements: 7.2 x 3.3 x 4.3 cm = volume: 52.4 mL. 1.5 x 1.6 x 1.4 cm intramural fibroid present at the left uterine fundus. Additional 0.8 x 0.9 x 0.7 cm submucosal fibroid present at the mid uterine body. Endometrium Thickness: 8.3 mm.  No focal abnormality visualized.  Right ovary Not visualized.  No adnexal mass. Left ovary Not visualized.  No adnexal mass. Other findings No abnormal free fluid. IMPRESSION: 1. Two separate uterine fibroids as detailed above. 2. Mildly thickened endometrium measuring up to 8.3 mm in thickness. Endometrial thickness is considered abnormal for an asymptomatic post-menopausal female. Endometrial sampling should be considered to exclude carcinoma. 3. Nonvisualization of the ovaries. No adnexal mass or abnormal free fluid. Electronically Signed   By: Jeannine Boga M.D.   On: 09/12/2018 00:55    Pending Labs Unresulted Labs (From admission, onward)    Start     Ordered   09/12/18 0500  Comprehensive metabolic panel  Daily,   STAT     09/12/18 0147   09/12/18 0500  CBC  Daily,   STAT     09/12/18 0147   09/12/18 0500  Lipase, blood  Daily,   STAT     09/12/18 0147   09/12/18 0136  CBC  (enoxaparin (LOVENOX)    CrCl >/= 30 ml/min)  Once,   STAT    Comments:  Baseline for enoxaparin therapy IF NOT ALREADY DRAWN.  Notify MD if PLT < 100 K.    09/12/18 0147          Vitals/Pain Today's Vitals   09/11/18 2145 09/11/18 2146 09/11/18 2200 09/12/18 0030  BP:   (!) 154/63   Pulse:   77 75  Resp:   17 20  Temp: 98.5 F (36.9 C)     TempSrc: Oral     SpO2:   94% 98%  Weight:  77.1 kg    Height:  5\' 9"  (1.753 m)    PainSc:  0-No pain      Isolation Precautions No active isolations  Medications Medications  sodium chloride 0.9 % bolus 1,000 mL (has no administration in time range)  metroNIDAZOLE (FLAGYL) IVPB 500 mg (has no administration in time range)  amLODipine (  NORVASC) tablet 10 mg (has no administration in time range)  hydrALAZINE (APRESOLINE) tablet 50 mg (has no administration in time range)  nitroGLYCERIN (NITROSTAT) SL tablet 0.4 mg (has no administration in time range)  levothyroxine (SYNTHROID, LEVOTHROID) tablet 50 mcg (has no administration in time range)  sodium bicarbonate tablet 650 mg (has no  administration in time range)  vitamin B-12 (CYANOCOBALAMIN) tablet 250 mcg (has no administration in time range)  Vitamin D (Ergocalciferol) (DRISDOL) capsule 50,000 Units (has no administration in time range)  albuterol (PROVENTIL) (2.5 MG/3ML) 0.083% nebulizer solution 2.5 mg (has no administration in time range)  enoxaparin (LOVENOX) injection 30 mg (has no administration in time range)  0.9 %  sodium chloride infusion (has no administration in time range)  oxyCODONE (Oxy IR/ROXICODONE) immediate release tablet 5 mg (has no administration in time range)  magnesium hydroxide (MILK OF MAGNESIA) suspension 30 mL (has no administration in time range)  ondansetron (ZOFRAN) tablet 4 mg (has no administration in time range)    Or  ondansetron (ZOFRAN) injection 4 mg (has no administration in time range)  morphine 2 MG/ML injection 2 mg (has no administration in time range)  iohexol (OMNIPAQUE) 240 MG/ML injection 25 mL (has no administration in time range)    Mobility walks Low fall risk   Focused Assessments Pancreatitis    R Recommendations: See Admitting Provider Note  Report given to:   Additional Notes:

## 2018-09-12 NOTE — H&P (Addendum)
[facility] [place] NAME: Karla Little    MR#:  [id number]  DATE OF BIRTH:  [birth year]  DATE OF ADMISSION:  09/11/2018  PRIMARY CARE PHYSICIAN: [name], MD   REQUESTING/REFERRING PHYSICIAN: Lurline Hare, MD  CHIEF COMPLAINT:   Chief Complaint  Patient presents with   Abdominal Pain    HISTORY OF PRESENT ILLNESS:  Karla Little  is a 85 y.o. female with a known history of stage III-IV chronic kidney disease, dyslipidemia, hypertension, hypothyroidism and osteoporosis, who presented to the emergency room acute onset of right upper quadrant abdominal pain that extends to her epigastric area and to her left upper quadrant and felt like indigestion.  She graded at 5-6/10 in severity and it was not associated with any nausea or vomiting.  She has been having occasional hard bowel movement and occasional loose 1.  She admits to urinary frequency and nocturia about 2-3 times with mild dysuria with no flank pain or hematuria.  She has been having occasional dyspnea and occasional cough.  She had the flu a in January for which she was treated here and given Tamiflu and albuterol inhaler that she used then.  No fever or chills.  No recent sick exposure or travel out of the state or out of the country.  She has not been in large crowds lately and believes that she has not been exposed to anybody with COVID-19.  Upon presentation to the emergency room her blood pressure slightly elevated at 154/63 like secondary to pain, with otherwise normal vital signs.  Labs were remarkable for a creatinine of 1.62 at her baseline and a significantly elevated serum lipase of 3433 as well as elevated AST of 741, ALT 371 and troponin I was less than 0.03.  CBC was unremarkable.  Urinalysis showed many bacteria with 6-10 WBCs and WBC clumps.  The patient was feeling a mass in her vagina upon physical examination there was a palpable mass to the left portion of the  pelvic transvaginal ultrasound that revealed 2 uterine fibroids and mildly thickened endometrium measuring up to 8.3 mm in thickness that may need follow-up with endometrial sampling.  The patient was given 1 L bolus of IV normal saline 500 g of IV Flagyl.  She will be admitted to a medical bed for further evaluation and management. PAST MEDICAL HISTORY:   Past Medical History:  Diagnosis Date   Chronic kidney disease    Hyperlipidemia    Hypertension    Hypothyroidism    Osteoporosis    Renal insufficiency    Vertigo     PAST SURGICAL HISTORY:   Past Surgical History:  Procedure Laterality Date   BREAST BIOPSY     COLONOSCOPY W/ POLYPECTOMY     COLONOSCOPY WITH PROPOFOL N/A 06/02/2015   Procedure: COLONOSCOPY WITH PROPOFOL;  Surgeon: Manya Silvas, MD;  Location: [facility];  Service: Endoscopy;  Laterality: N/A;    SOCIAL HISTORY:   Social History   Tobacco Use   Smoking status: Never Smoker   Smokeless tobacco: Never Used  Substance Use Topics   Alcohol use: No    FAMILY HISTORY:   Family History  Problem Relation Age of Onset   Hypertension Mother    Heart disease Father    Cancer Sister        breast    DRUG ALLERGIES:   Allergies  Allergen Reactions   Amoxicillin Rash    REVIEW OF SYSTEMS:  ROS As per history of present illness. All pertinent systems were reviewed above. Constitutional,  HEENT, cardiovascular, respiratory, GI, GU, musculoskeletal, neuro, psychiatric, endocrine,  integumentary and hematologic systems were reviewed and are otherwise  negative/unremarkable except for positive findings mentioned above in the HPI.   MEDICATIONS AT HOME:   Prior to Admission medications   Medication Sig Start Date End Date Taking? Authorizing Provider  albuterol (PROVENTIL HFA;VENTOLIN HFA) 108 (90 Base) MCG/ACT inhaler Inhale 2 puffs into the lungs every 6 (six) hours as needed for wheezing or shortness of breath. 07/15/18    Lavonia Drafts, MD  amLODipine (NORVASC) 10 MG tablet Take 1 tablet (10 mg total) by mouth daily. 07/02/18   [name], MD  calcitRIOL (ROCALTROL) 0.25 MCG capsule Take 0.25 mcg by mouth. 11/24/15   [provider]  hydrALAZINE (APRESOLINE) 25 MG tablet Take 2 tablets (50 mg total) by mouth daily. 07/02/18   [name], MD  levothyroxine (SYNTHROID, LEVOTHROID) 50 MCG tablet Take 1 tablet (50 mcg total) by mouth daily. 07/02/18   [name], MD  lisinopril (PRINIVIL,ZESTRIL) 10 MG tablet Take 1 tablet (10 mg total) by mouth daily. 07/02/18   [name], MD  nitroGLYCERIN (NITROSTAT) 0.4 MG SL tablet Place 0.4 mg under the tongue every 5 (five) minutes as needed for chest pain.    [provider]  sodium bicarbonate 650 MG tablet  02/25/15   [provider]  vitamin B-12 (CYANOCOBALAMIN) 250 MCG tablet Take 250 mcg by mouth daily.    [provider]  Vitamin D, Ergocalciferol, (DRISDOL) 50000 UNITS CAPS capsule Take 50,000 Units by mouth daily.     [provider]  vitamin E 200 UNIT capsule Take 200 Units by mouth daily.    [provider]      VITAL SIGNS:  Blood pressure (!) 154/63, pulse 75, temperature 98.5 F (36.9 C), temperature source Oral, resp. rate 20, height 5\' 9"  (1.753 m), weight 77.1 kg, SpO2 98 %.  PHYSICAL EXAMINATION:  Physical Exam  GENERAL:  85 y.o.-year-old Caucasian female patient lying in the bed with no acute distress.  EYES: Pupils equal, round, reactive to light and accommodation. No scleral icterus. Extraocular muscles intact.  HEENT: Head atraumatic, normocephalic. Oropharynx and nasopharynx clear.  NECK:  Supple, no jugular venous distention. No thyroid enlargement, no tenderness.  LUNGS: Normal breath sounds bilaterally, no wheezing, rales,rhonchi or crepitation. No use of accessory muscles of respiration.  CARDIOVASCULAR: S1, S2 normal. No murmurs, rubs, or gallops.  ABDOMEN: Soft,  nondistended with epigastric and right upper quadrant tenderness and mild positive Murphy sign with less tenderness in the left upper quadrant without rebound tenderness guarding or rigidity.. Bowel sounds present. No organomegaly or mass.  EXTREMITIES: No pedal edema, cyanosis, or clubbing.  NEUROLOGIC: Cranial nerves II through XII are intact. Muscle strength 5/5 in all extremities. Sensation intact. Gait not checked.  PSYCHIATRIC: The patient is alert and oriented x 3.  SKIN: No obvious rash, lesion, or ulcer.   LABORATORY PANEL:   CBC Recent Labs  Lab 09/11/18 2148  WBC 5.9  HGB 12.5  HCT 37.4  PLT 189   ------------------------------------------------------------------------------------------------------------------  Chemistries  Recent Labs  Lab 09/11/18 2148  NA 134*  K 4.2  CL 107  CO2 17*  GLUCOSE 155*  BUN 30*  CREATININE 1.62*  CALCIUM 9.9  AST 741*  ALT 371*  ALKPHOS 155*  BILITOT 0.7   ------------------------------------------------------------------------------------------------------------------  Cardiac Enzymes Recent Labs  Lab 09/11/18  2148  TROPONINI <0.03   ------------------------------------------------------------------------------------------------------------------  RADIOLOGY:  US Pelvis Transvanginal Non-ob (tv Only)  Result Date: 09/12/2018 CLINICAL DATA:  Initial evaluation for uterine mass. EXAM: TRANSABDOMINAL AND TRANSVAGINAL ULTRASOUND OF PELVIS TECHNIQUE: Both transabdominal and transvaginal ultrasound examinations of the pelvis were performed. Transabdominal technique was performed for global imaging of the pelvis including uterus, ovaries, adnexal regions, and pelvic cul-de-sac. It was necessary to proceed with endovaginal exam following the transabdominal exam to visualize the uterus, endometrium, and ovaries. COMPARISON:  Prior CT from 09/23/2016. FINDINGS: Uterus Measurements: 7.2 x 3.3 x 4.3 cm = volume: 52.4 mL. 1.5 x 1.6 x 1.4  cm intramural fibroid present at the left uterine fundus. Additional 0.8 x 0.9 x 0.7 cm submucosal fibroid present at the mid uterine body. Endometrium Thickness: 8.3 mm.  No focal abnormality visualized. Right ovary Not visualized.  No adnexal mass. Left ovary Not visualized.  No adnexal mass. Other findings No abnormal free fluid. IMPRESSION: 1. Two separate uterine fibroids as detailed above. 2. Mildly thickened endometrium measuring up to 8.3 mm in thickness. Endometrial thickness is considered abnormal for an asymptomatic post-menopausal female. Endometrial sampling should be considered to exclude carcinoma. 3. Nonvisualization of the ovaries. No adnexal mass or abnormal free fluid. Electronically Signed   By: Jeannine Boga M.D.   On: 09/12/2018 00:55   US Pelvis (transabdominal Only)  Result Date: 09/12/2018 CLINICAL DATA:  Initial evaluation for uterine mass. EXAM: TRANSABDOMINAL AND TRANSVAGINAL ULTRASOUND OF PELVIS TECHNIQUE: Both transabdominal and transvaginal ultrasound examinations of the pelvis were performed. Transabdominal technique was performed for global imaging of the pelvis including uterus, ovaries, adnexal regions, and pelvic cul-de-sac. It was necessary to proceed with endovaginal exam following the transabdominal exam to visualize the uterus, endometrium, and ovaries. COMPARISON:  Prior CT from 09/23/2016. FINDINGS: Uterus Measurements: 7.2 x 3.3 x 4.3 cm = volume: 52.4 mL. 1.5 x 1.6 x 1.4 cm intramural fibroid present at the left uterine fundus. Additional 0.8 x 0.9 x 0.7 cm submucosal fibroid present at the mid uterine body. Endometrium Thickness: 8.3 mm.  No focal abnormality visualized. Right ovary Not visualized.  No adnexal mass. Left ovary Not visualized.  No adnexal mass. Other findings No abnormal free fluid. IMPRESSION: 1. Two separate uterine fibroids as detailed above. 2. Mildly thickened endometrium measuring up to 8.3 mm in thickness. Endometrial thickness is  considered abnormal for an asymptomatic post-menopausal female. Endometrial sampling should be considered to exclude carcinoma. 3. Nonvisualization of the ovaries. No adnexal mass or abnormal free fluid. Electronically Signed   By: Jeannine Boga M.D.   On: 09/12/2018 00:55      IMPRESSION AND PLAN:   #1.  Acute pancreatitis.  The patient will be made to a medical bed.  He will be kept n.p.o.  Will follow serial lipase levels.  Pain management will be provided.  Abdominal ultrasound is currently pending for further assessment.  She has no history of alcohol abuse and has no significant offending medications therefore at this time the etiology is unknown.  She will need an abdominal CT scan for further assessment to rule out masses and complication with cysts.  A GI consultation by Dr. Allen Norris will be obtained.  I contacted him regarding the patient  2.  Elevated transaminases.  This could be mild associated hepatitis.  We will follow her right upper quadrant ultrasound and repeat LFTs with hydration.  Will obtain viral hepatitis panel.  The patient's bilirubin is normal and her alkaline phosphatase is not significantly  elevated to suggest obstruction by gallstones.  She could still have gallstones though which will make her pancreatitis more suspicious to be gallstone pancreatitis.  3.   Uterine fibroids and increased endometrial thickness.  A GYN consult will be obtained while she is here by Dr. Marcelline Mates.  I contacted her about the patient.  4.  Possible UTI.  Will obtain urine culture and sensitivity and place her on IV Rocephin  5.  Stage III-IV chronic kidney disease.  We will continue her serum bicarbonate  6.  Hypertension.  I will hold off her lisinopril for now and continue her hydralazine and amlodipine.   7.  DVT prophylaxis.  Subcutaneous Lovenox will be provided.   All the records are reviewed and case discussed with ED provider. The plan of care was discussed in details with the  patient (and family). I answered all questions. The patient agreed to proceed with the above mentioned plan. Further management will depend upon hospital course.  This dictation has been created by [employer].  Any transcription errors that resulted from this process are unintentional.   CODE STATUS: Full  TOTAL TIME TAKING CARE OF THIS PATIENT: 50 minutes.    Christel Mormon M.D on 09/12/2018 at 1:48 AM  Pager - [phone]  After 6pm go to [email or web address] - Proofreader  [facility]  Office  [phone]  CC: Primary care physician; [name], MD   Note: This dictation was prepared with Dragon dictation along with smaller phrase technology. Any transcriptional errors that result from this process are unintentional.

## 2018-09-12 NOTE — Progress Notes (Signed)
Advance care planning  Purpose of Encounter Gallstone pancreatitis  Parties in Attendance Patient  Patients Decisional capacity Alert and oriented.  Able to make medical decisions. Does not have a HCPOA or ACP documents in place.  Encouraged to have documentation done  She would like her daughter Karla Little to make decisions if she is unable in the hospital.   We discussed regarding patient's pancreatitis and gallstones found on ultrasound and treatment plan and prognosis.  All questions answered.  Advised that she will also be seen by surgery and gynecology team during the hospital stay.  CODE STATUS discussed and patient tells me that she wishes to be DO NOT RESUSCITATE and DO NOT INTUBATE.  Explained DNR status and all questions answered  Orders entered and CODE STATUS changed  DNR/DNI  Time spent - 17 minutes

## 2018-09-12 NOTE — ED Provider Notes (Signed)
-----------------------------------------   1:18 AM on 09/12/2018 -----------------------------------------  Updated patient on pelvic ultrasound and rest of lab results.  Will send for right upper quadrant abdominal ultrasound to evaluate for gallstone pancreatitis.  Anticipate admission.   ----------------------------------------- 1:28 AM on 09/12/2018 -----------------------------------------  Spoke with hospitalist Dr. Sidney Ace who will evaluate patient in the emergency department for admission.   Paulette Blanch, MD 09/12/18 [phone]

## 2018-09-13 DIAGNOSIS — K851 Biliary acute pancreatitis without necrosis or infection: Principal | ICD-10-CM

## 2018-09-13 LAB — HEPATITIS PANEL, ACUTE
HCV Ab: <0.1 s/co ratio (ref 0.0–0.9)
Hep A IgM: NEGATIVE
Hep B C IgM: NEGATIVE
Hepatitis B Surface Ag: NEGATIVE

## 2018-09-13 LAB — COMPREHENSIVE METABOLIC PANEL
ALT: 187 U/L — ABNORMAL HIGH (ref 0–44)
AST: 120 U/L — ABNORMAL HIGH (ref 15–41)
Albumin: 3.3 g/dL — ABNORMAL LOW (ref 3.5–5.0)
Alkaline Phosphatase: 125 U/L (ref 38–126)
Anion gap: 7 (ref 5–15)
BUN: 18 mg/dL (ref 8–23)
CO2: 20 mmol/L — ABNORMAL LOW (ref 22–32)
Calcium: 8.8 mg/dL — ABNORMAL LOW (ref 8.9–10.3)
Chloride: 110 mmol/L (ref 98–111)
Creatinine, Ser: 1.2 mg/dL — ABNORMAL HIGH (ref 0.44–1.00)
GFR calc Af Amer: 48 mL/min — ABNORMAL LOW (ref >60)
GFR calc non Af Amer: 42 mL/min — ABNORMAL LOW (ref >60)
Glucose, Bld: 104 mg/dL — ABNORMAL HIGH (ref 70–99)
Potassium: 4 mmol/L (ref 3.5–5.1)
Sodium: 137 mmol/L (ref 135–145)
Total Bilirubin: 0.5 mg/dL (ref 0.3–1.2)
Total Protein: 6.4 g/dL — ABNORMAL LOW (ref 6.5–8.1)

## 2018-09-13 LAB — LIPASE, BLOOD: Lipase: 1392 U/L — ABNORMAL HIGH (ref 11–51)

## 2018-09-13 LAB — URINE CULTURE

## 2018-09-13 LAB — CBC
HCT: 35 % — ABNORMAL LOW (ref 36.0–46.0)
Hemoglobin: 11.5 g/dL — ABNORMAL LOW (ref 12.0–15.0)
MCH: 31.5 pg (ref 26.0–34.0)
MCHC: 32.9 g/dL (ref 30.0–36.0)
MCV: 95.9 fL (ref 80.0–100.0)
Platelets: 225 10*3/uL (ref 150–400)
RBC: 3.65 MIL/uL — ABNORMAL LOW (ref 3.87–5.11)
RDW: 13.1 % (ref 11.5–15.5)
WBC: 6.2 10*3/uL (ref 4.0–10.5)
nRBC: 0 % (ref 0.0–0.2)

## 2018-09-13 MED ORDER — ENOXAPARIN SODIUM 40 MG/0.4ML ~~LOC~~ SOLN
40.0000 mg | SUBCUTANEOUS | Status: DC
  Administered 2018-09-13: 40 mg via SUBCUTANEOUS
  Filled 2018-09-13: qty 0.4

## 2018-09-13 MED ORDER — LISINOPRIL 10 MG PO TABS
10.0000 mg | ORAL_TABLET | Freq: Every day | ORAL | Status: DC
  Filled 2018-09-13: qty 1

## 2018-09-13 MED ORDER — ACETAMINOPHEN 325 MG PO TABS
650.0000 mg | ORAL_TABLET | ORAL | Status: DC | PRN
  Administered 2018-09-13: 650 mg via ORAL
  Filled 2018-09-13: qty 2

## 2018-09-13 NOTE — Progress Notes (Signed)
[facility] [place] NAME: Karla Little    MR#:  [id number]  DATE OF BIRTH:  [birth year]  SUBJECTIVE:   Patient still having significant epigastric abdominal pain today.  She denies any nausea, vomiting, fevers, chills.  REVIEW OF SYSTEMS:  Review of Systems  Constitutional: Negative for chills and fever.  HENT: Negative for congestion and sore throat.   Eyes: Negative for blurred vision and double vision.  Respiratory: Negative for cough and shortness of breath.   Cardiovascular: Negative for chest pain and palpitations.  Gastrointestinal: Positive for abdominal pain. Negative for nausea and vomiting.  Genitourinary: Negative for dysuria and urgency.  Musculoskeletal: Negative for back pain and neck pain.  Neurological: Negative for dizziness and headaches.  Psychiatric/Behavioral: Negative for depression. The patient is not nervous/anxious.     DRUG ALLERGIES:   Allergies  Allergen Reactions  . Amoxicillin Rash   VITALS:  Blood pressure (!) 143/54, pulse 74, temperature 98 F (36.7 C), temperature source Oral, resp. rate 16, height 5\' 6"  (1.676 m), weight 75.3 kg, SpO2 98 %. PHYSICAL EXAMINATION:  Physical Exam  GENERAL:  Laying in the bed with no acute distress.  HEENT: Head atraumatic, normocephalic. Pupils equal, round, reactive to light and accommodation. No scleral icterus. Extraocular muscles intact. Oropharynx and nasopharynx clear.  NECK:  Supple, no jugular venous distention. No thyroid enlargement. LUNGS: Lungs are clear to auscultation bilaterally. No wheezes, crackles, rhonchi. No use of accessory muscles of respiration.  CARDIOVASCULAR: RRR, S1, S2 normal. No murmurs, rubs, or gallops.  ABDOMEN: Soft, nondistended. Bowel sounds present. +epigastric abdominal pain, no rebound or guarding. EXTREMITIES: No pedal edema, cyanosis, or clubbing.  NEUROLOGIC: CN 2-12 intact, no focal deficits. 5/5 muscle strength  throughout all extremities. Sensation intact throughout. Gait not checked.  PSYCHIATRIC: The patient is alert and oriented x 3.  SKIN: No obvious rash, lesion, or ulcer.  LABORATORY PANEL:  Female CBC Recent Labs  Lab 09/13/18 0453  WBC 6.2  HGB 11.5*  HCT 35.0*  PLT 225   ------------------------------------------------------------------------------------------------------------------ Chemistries  Recent Labs  Lab 09/13/18 0453  NA 137  K 4.0  CL 110  CO2 20*  GLUCOSE 104*  BUN 18  CREATININE 1.20*  CALCIUM 8.8*  AST 120*  ALT 187*  ALKPHOS 125  BILITOT 0.5   RADIOLOGY:  No results found. ASSESSMENT AND PLAN:   Gallstone pancreatitis- improving, although patient has continued pain today and still not having good p.o. intake.  Lipase and liver transaminases trending down. -MRCP 4/10 without evidence of choledocholithiasis -Pain control -Recheck CMP in the morning -Seen by surgery, who recommends cholecystectomy in 6 to 8 weeks.  Will need to follow-up with them as an outpatient in 4 to 6 weeks.  Left kidney lesion- incidental finding seen on MRCP. -Needs follow-up MRI with and without contrast in 3 months  Uterine fibroids and increased endometrial thickness- seen on pelvic US in the ED -Needs to follow-up with gynecology as an outpatient for endometrial biopsy  Stage III-IV chronic kidney disease- creatinine improving -Continue her serum bicarbonate  Hypertension- blood pressures mildly elevated -Continue her hydralazine and amlodipine -Restart lisinopril in the morning   DVT prophylaxis-Lovenox  All the records are reviewed and case discussed with Care Management/Social Worker. Management plans discussed with the patient, family and they are in agreement.  CODE STATUS: DNR  TOTAL TIME TAKING CARE OF THIS PATIENT: 45 minutes.   More than 50% of the time was spent in  counseling/coordination of care: YES  POSSIBLE D/C IN 1-2 DAYS, DEPENDING ON  CLINICAL CONDITION.   Berna Spare Sovereign Ramiro M.D on 09/13/2018 at 3:16 PM  Between 7am to 6pm - Pager - [phone]  After 6pm go to [email or web address] - Proofreader  [facility]  Office  [phone]  CC: Primary care physician; [name], MD  Note: This dictation was prepared with Dragon dictation along with smaller phrase technology. Any transcriptional errors that result from this process are unintentional.

## 2018-09-13 NOTE — Consult Note (Signed)
Patient ID: Karla Little, female   DOB: 04/07/1934, 85 y.o.   MRN: [id number]  HPI Karla Little is a 85 y.o. female asked to see in consultation at the request of Dr. Brett Albino for gallstone pancreatitis.  She reported around 36-hour history of abdominal pain in the epigastric area.  The pain was acute in onset intermittent and lasted for a few hours.  The pain is sharp.  Currently the pain has subsided and she feels much improved.  Initially she thought she was having a heart attack. He is very functional active for an 85 year old female.  She walks without a cane and is able to perform more than 4 METS of activity without any shortness of breath or chest pain.  Her cortical activities are intact. Further work-up iincluding a CT scan as well as an ultrasound and MRCP were performed.  His note that I have personally review all of the images.  There is evidence of gallstones and pancreatitis without evidence of necrosis.  No evidence of choledocholithiasis.  Her lipase was 3433 and now lipase is 1392.  WB Count is normal.  cmp shows mild transaminitis And chronic renal insufficiency. TB 0.5  HPI  Past Medical History:  Diagnosis Date  . Chronic kidney disease   . Hyperlipidemia   . Hypertension   . Hypothyroidism   . Osteoporosis   . Renal insufficiency   . Vertigo     Past Surgical History:  Procedure Laterality Date  . BREAST BIOPSY    . COLONOSCOPY W/ POLYPECTOMY    . COLONOSCOPY WITH PROPOFOL N/A 06/02/2015   Procedure: COLONOSCOPY WITH PROPOFOL;  Surgeon: Manya Silvas, MD;  Location[facility] ENDOSCOPY;  Service: Endoscopy;  Laterality: N/A;    Family History  Problem Relation Age of Onset  . Hypertension Mother   . Heart disease Father   . Cancer Sister        breast    Social History Social History   Tobacco Use  . Smoking status: Never Smoker  . Smokeless tobacco: Never Used  Substance Use Topics  . Alcohol use: No  . Drug use: No    Allergies  Allergen  Reactions  . Amoxicillin Rash    Current Facility-Administered Medications  Medication Dose Route Frequency Provider Last Rate Last Dose  . 0.9 %  sodium chloride infusion   Intravenous Continuous Hillary Bow, MD 75 mL/hr at 09/13/18 0452    . 0.9 %  sodium chloride infusion   Intravenous PRN Mansy, Arvella Merles, MD   Stopped at 09/12/18 0759  . acetaminophen (TYLENOL) tablet 650 mg  650 mg Oral Q4H PRN Mansy, Jan A, MD   650 mg at 09/13/18 0427  . albuterol (PROVENTIL) (2.5 MG/3ML) 0.083% nebulizer solution 2.5 mg  2.5 mg Inhalation Q6H PRN Mansy, Jan A, MD      . amLODipine (NORVASC) tablet 10 mg  10 mg Oral Daily Mansy, Jan A, MD   10 mg at 09/13/18 9629  . cefTRIAXone (ROCEPHIN) 1 g in sodium chloride 0.9 % 100 mL IVPB  1 g Intravenous Q24H Mansy, Arvella Merles, MD   Stopped at 09/13/18 0451  . enoxaparin (LOVENOX) injection 40 mg  40 mg Subcutaneous Q24H Dallie Piles, RPH      . hydrALAZINE (APRESOLINE) tablet 50 mg  50 mg Oral Daily Mansy, Jan A, MD   50 mg at 09/13/18 5284  . levothyroxine (SYNTHROID, LEVOTHROID) tablet 50 mcg  50 mcg Oral Daily Mansy, Arvella Merles, MD  50 mcg at 09/13/18 0627  . magnesium hydroxide (MILK OF MAGNESIA) suspension 30 mL  30 mL Oral Daily PRN Mansy, Jan A, MD      . morphine 2 MG/ML injection 2 mg  2 mg Intravenous Q4H PRN Mansy, Jan A, MD      . nitroGLYCERIN (NITROSTAT) SL tablet 0.4 mg  0.4 mg Sublingual Q5 min PRN Mansy, Jan A, MD      . ondansetron [facility]) tablet 4 mg  4 mg Oral Q6H PRN Mansy, Jan A, MD       Or  . ondansetron [facility]) injection 4 mg  4 mg Intravenous Q6H PRN Mansy, Jan A, MD      . oxyCODONE (Oxy IR/ROXICODONE) immediate release tablet 5 mg  5 mg Oral Q4H PRN Mansy, Jan A, MD   5 mg at 09/13/18 1059  . sodium bicarbonate tablet 650 mg  650 mg Oral Daily Mansy, Jan A, MD   650 mg at 09/13/18 0998  . vitamin B-12 (CYANOCOBALAMIN) tablet 250 mcg  250 mcg Oral Daily Mansy, Jan A, MD   250 mcg at 09/13/18 0926  . [START ON 09/15/2018] Vitamin D  (Ergocalciferol) (DRISDOL) capsule 50,000 Units  50,000 Units Oral Weekly Mansy, Jan A, MD         Review of Systems Full ROS  was asked and was negative except for the information on the HPI  Physical Exam Blood pressure (!) 143/54, pulse 74, temperature 98 F (36.7 C), temperature source Oral, resp. rate 16, height 5\' 6"  (1.676 m), weight 75.3 kg, SpO2 98 %. CONSTITUTIONAL: NAD EYES: Pupils are equal, round, and reactive to light, Sclera are non-icteric. EARS, NOSE, MOUTH AND THROAT: The oropharynx is clear. The oral mucosa is pink and moist. Hearing is intact to voice. LYMPH NODES:  Lymph nodes in the neck are normal. RESPIRATORY:  Lungs are clear. There is normal respiratory effort, with equal breath sounds bilaterally, and without pathologic use of accessory muscles. CARDIOVASCULAR: Heart is regular without murmurs, gallops, or rubs. GI: The abdomen is  soft, nontender, and nondistended. There are no palpable masses. There is no hepatosplenomegaly. There are normal bowel sounds in all quadrants. GU: Rectal deferred.   MUSCULOSKELETAL: Normal muscle strength and tone. No cyanosis or edema.   SKIN: Turgor is good and there are no pathologic skin lesions or ulcers. NEUROLOGIC: Motor and sensation is grossly normal. Cranial nerves are grossly intact. PSYCH:  Oriented to person, place and time. Affect is normal.  Data Reviewed  I have personally reviewed the patient's imaging, laboratory findings and medical records.    Assessment/Plan  85 year old female presented with gallstone pancreatitis.  No Evidence of biliary obstruction.  Currently no evidence of ascending cholangitis.  She is slowly improving from her pancreatitis.  Given Covid-19 epidemic and given her age and increased risk of morbidity associated with COVID-19 I do recommend cholecystectomy in the outpatient setting in 6 to 8 weeks. Recommend low-fat diet and serial abdominal exam. There is no evidence of necrotizing  pancreatitis. There is no need for any emergent surgical intervention at this time. We will follow her as an outpatient in about 4 to 6 weeks I have Provided extensive counseling and discussed the situation with the patient in detail   Caroleen Hamman, MD [facility] 09/13/2018, 12:23 PM

## 2018-09-13 NOTE — Progress Notes (Signed)
Karla Lame, MD [facility]   [address] [place], [place] [address] Phone: [phone] Fax : [phone]   Subjective: This patient was consulted for presumed gallstone pancreas.  The patient had MRCP done that showed no common bile duct stones.  There was evidence of pancreatitis without necrosis.  The patient's lipase which was over 3000 has decreased by approximately 1,000 every day. The patient denies any abdominal pain at this time but states that she was given pain medication which is making her feel tingly all over and does not like the sensation.    Objective: Vital signs in last 24 hours: Vitals:   09/12/18 1818 09/12/18 1922 09/13/18 0435 09/13/18 1153  BP: (!) 143/53 (!) 146/61 (!) 144/54 (!) 143/54  Pulse: 77 77 73 74  Resp:  20 16 16   Temp: 98.7 F (37.1 C) 98.4 F (36.9 C) 98.7 F (37.1 C) 98 F (36.7 C)  TempSrc: Oral Oral Oral Oral  SpO2: 98% 97% 98% 98%  Weight:      Height:       Weight change:   Intake/Output Summary (Last 24 hours) at 09/13/2018 1442 Last data filed at 09/13/2018 1439 Gross per 24 hour  Intake 2893.04 ml  Output 1450 ml  Net 1443.04 ml     Exam: Heart:: Regular rate and rhythm, S1S2 present or without murmur or extra heart sounds Lungs: normal and clear to auscultation and percussion Abdomen: soft, nontender, normal bowel sounds   Lab Results: @LABTEST2 @ Micro Results: Recent Results (from the past 240 hour(s))  Urine Culture     Status: Abnormal   Collection Time: 09/12/18  5:07 AM  Result Value Ref Range Status   Specimen Description   Final    URINE, RANDOM Performed at [facility], [address], [place], [place] [address]    Special Requests   Final    NONE Performed at [facility], [address] [place], [place] [address]    Culture MULTIPLE SPECIES PRESENT, SUGGEST RECOLLECTION (A)  Final   Report Status 09/13/2018 FINAL  Final   Studies/Results: Ct Abdomen Pelvis Wo  Contrast  Result Date: 09/12/2018 CLINICAL DATA:  Epigastric abdominal pain. Pancreatitis. EXAM: CT ABDOMEN AND PELVIS WITHOUT CONTRAST TECHNIQUE: Multidetector CT imaging of the abdomen and pelvis was performed following the standard protocol without IV contrast. COMPARISON:  Right upper quadrant ultrasound performed concurrently. Abdominal CT 09/23/2016 FINDINGS: Lower chest: Possible small diaphragmatic nodule in the left lower lobe. No pleural fluid. Moderate hiatal hernia. Hepatobiliary: Small cyst in the right lobe of the liver. Large gallstones in the gallbladder, central low density consistent with nitrogen. Mild gallbladder wall thickening. Common bile duct is 8 mm, upper normal for age. Pancreas: Peripancreatic fat stranding most prominent about the head, uncinate process, and body. Peripancreatic fluid with fluid tracking in the right retroperitoneum and pericolic gutter. No organized peripancreatic fluid collection. Cannot assess for necrosis due to lack contrast. No ductal dilatation. Spleen: Calcified granuloma. Normal in size. Adrenals/Urinary Tract: Right renal atrophy with compensatory hypertrophy of the left kidney. Lobulated left renal contours. Mild left perinephric edema which is nonspecific. Cyst in the posterior mid left kidney which not well characterized in the absence of contrast. No hydronephrosis. Urinary bladder is near completely empty. Small focus of air in the bladder may be due to recent instrumentation. No adrenal nodule. Stomach/Bowel: Bowel evaluation is limited in the absence of enteric contrast. Moderate hiatal hernia. Wall thickening of the duodenum is likely reactive secondary to pancreatic  inflammation. No other small bowel wall thickening or inflammatory change. No bowel obstruction. Colonic diverticulosis, prominent in the sigmoid colon. No evidence of diverticulitis. Moderate colonic stool burden. No colonic inflammation. Normal appendix. Vascular/Lymphatic: Aortic  atherosclerosis. No aneurysm. No abdominopelvic adenopathy. Reproductive: Uterine fibroids and pelvic ultrasound earlier this day are not well demonstrated on noncontrast exam. Ovaries not confidently visualized, no adnexal mass. Other: Peripancreatic edema with free fluid which tracks into the right pericolic gutter. No free air. Surgical clips adjacent to the left iliac vessels. Musculoskeletal: There are no acute or suspicious osseous abnormalities. IMPRESSION: 1. Acute edematous pancreatitis. Peripancreatic fluid tracks in the right pericolic gutter. No organized peripancreatic fluid collection. 2. Large gallstones with mild gallbladder wall thickening. Common bile duct measures 8 mm, upper normal for age. 3. Moderate hiatal hernia. 4. Colonic diverticulosis without diverticulitis. 5. Possible small pulmonary nodule abutting the left diaphragm. Aortic Atherosclerosis (ICD10-I70.0). Electronically Signed   By: Keith Rake M.D.   On: 09/12/2018 02:59   US Pelvis Transvanginal Non-ob (tv Only)  Result Date: 09/12/2018 CLINICAL DATA:  Initial evaluation for uterine mass. EXAM: TRANSABDOMINAL AND TRANSVAGINAL ULTRASOUND OF PELVIS TECHNIQUE: Both transabdominal and transvaginal ultrasound examinations of the pelvis were performed. Transabdominal technique was performed for global imaging of the pelvis including uterus, ovaries, adnexal regions, and pelvic cul-de-sac. It was necessary to proceed with endovaginal exam following the transabdominal exam to visualize the uterus, endometrium, and ovaries. COMPARISON:  Prior CT from 09/23/2016. FINDINGS: Uterus Measurements: 7.2 x 3.3 x 4.3 cm = volume: 52.4 mL. 1.5 x 1.6 x 1.4 cm intramural fibroid present at the left uterine fundus. Additional 0.8 x 0.9 x 0.7 cm submucosal fibroid present at the mid uterine body. Endometrium Thickness: 8.3 mm.  No focal abnormality visualized. Right ovary Not visualized.  No adnexal mass. Left ovary Not visualized.  No adnexal  mass. Other findings No abnormal free fluid. IMPRESSION: 1. Two separate uterine fibroids as detailed above. 2. Mildly thickened endometrium measuring up to 8.3 mm in thickness. Endometrial thickness is considered abnormal for an asymptomatic post-menopausal female. Endometrial sampling should be considered to exclude carcinoma. 3. Nonvisualization of the ovaries. No adnexal mass or abnormal free fluid. Electronically Signed   By: Jeannine Boga M.D.   On: 09/12/2018 00:55   US Pelvis (transabdominal Only)  Result Date: 09/12/2018 CLINICAL DATA:  Initial evaluation for uterine mass. EXAM: TRANSABDOMINAL AND TRANSVAGINAL ULTRASOUND OF PELVIS TECHNIQUE: Both transabdominal and transvaginal ultrasound examinations of the pelvis were performed. Transabdominal technique was performed for global imaging of the pelvis including uterus, ovaries, adnexal regions, and pelvic cul-de-sac. It was necessary to proceed with endovaginal exam following the transabdominal exam to visualize the uterus, endometrium, and ovaries. COMPARISON:  Prior CT from 09/23/2016. FINDINGS: Uterus Measurements: 7.2 x 3.3 x 4.3 cm = volume: 52.4 mL. 1.5 x 1.6 x 1.4 cm intramural fibroid present at the left uterine fundus. Additional 0.8 x 0.9 x 0.7 cm submucosal fibroid present at the mid uterine body. Endometrium Thickness: 8.3 mm.  No focal abnormality visualized. Right ovary Not visualized.  No adnexal mass. Left ovary Not visualized.  No adnexal mass. Other findings No abnormal free fluid. IMPRESSION: 1. Two separate uterine fibroids as detailed above. 2. Mildly thickened endometrium measuring up to 8.3 mm in thickness. Endometrial thickness is considered abnormal for an asymptomatic post-menopausal female. Endometrial sampling should be considered to exclude carcinoma. 3. Nonvisualization of the ovaries. No adnexal mass or abnormal free fluid. Electronically Signed   By: Jeannine Boga  M.D.   On: 09/12/2018 00:55   Mr Abdomen  Mrcp Wo Contrast  Result Date: 09/12/2018 CLINICAL DATA:  Pancreatitis. EXAM: MRI ABDOMEN WITHOUT CONTRAST  (INCLUDING MRCP) TECHNIQUE: Multiplanar multisequence MR imaging of the abdomen was performed. Heavily T2-weighted images of the biliary and pancreatic ducts were obtained, and three-dimensional MRCP images were rendered by post processing. COMPARISON:  CT scan earlier same day FINDINGS: Lower chest: Unremarkable. Hepatobiliary: 11 mm cyst noted lower right hepatic lobe. Mild intrahepatic biliary duct prominence noted. Extrahepatic common duct is upper normal at 8 mm diameter. Common bile duct upper normal for age at 7 mm diameter. As on CT, large gallstones evident. No choledocholithiasis. Pancreas: Pancreatic head and peripancreatic fat around the head of pancreas appear edematous. Mild prominence of the main pancreatic duct noted in head of pancreas without diffuse dilatation of the main pancreatic duct. Spleen:  No splenomegaly. No focal mass lesion. Adrenals/Urinary Tract: No adrenal nodule or mass. 9 mm cystic lesion noted upper pole of the atrophic right kidney. Cortical scarring noted left kidney with 15 mm intermediate intensity lesion in the posterior interpolar region on T2 imaging (23/15). No hydronephrosis. Stomach/Bowel: Moderate to large hiatal hernia. No small bowel or colonic dilatation. Vascular/Lymphatic: No abdominal aortic aneurysm. Normal flow signal is seen in the portal vein and superior mesenteric vein. No lymphadenopathy. Other: Edema is identified around the descending duodenum and pylorus. Fluid tracks into Morison's pouch and is seen in the right paracolic gutter. Musculoskeletal: No abnormal marrow signal within the visualized bony anatomy. IMPRESSION: 1. Prominent edema in and around the pancreatic head involving the duodenal bulb and descending duodenum with edema/fluid tracking in the right retroperitoneal space and into the right paracolic gutter. Imaging features are  compatible with the reported clinical history of pancreatitis. 2. Large gallstones with mild prominence of the intrahepatic bile ducts and upper normal for age diameter of the common bile duct. No findings of choledocholithiasis. 3. 15 mm indeterminate lesion interpolar left kidney. Consider follow-up MRI without and with contrast (renal function permitting) in 3 months. 4. Moderate to large hiatal hernia. Electronically Signed   By: Misty Stanley M.D.   On: 09/12/2018 12:59   Mr 3d Recon At Scanner  Result Date: 09/12/2018 CLINICAL DATA:  Pancreatitis. EXAM: MRI ABDOMEN WITHOUT CONTRAST  (INCLUDING MRCP) TECHNIQUE: Multiplanar multisequence MR imaging of the abdomen was performed. Heavily T2-weighted images of the biliary and pancreatic ducts were obtained, and three-dimensional MRCP images were rendered by post processing. COMPARISON:  CT scan earlier same day FINDINGS: Lower chest: Unremarkable. Hepatobiliary: 11 mm cyst noted lower right hepatic lobe. Mild intrahepatic biliary duct prominence noted. Extrahepatic common duct is upper normal at 8 mm diameter. Common bile duct upper normal for age at 7 mm diameter. As on CT, large gallstones evident. No choledocholithiasis. Pancreas: Pancreatic head and peripancreatic fat around the head of pancreas appear edematous. Mild prominence of the main pancreatic duct noted in head of pancreas without diffuse dilatation of the main pancreatic duct. Spleen:  No splenomegaly. No focal mass lesion. Adrenals/Urinary Tract: No adrenal nodule or mass. 9 mm cystic lesion noted upper pole of the atrophic right kidney. Cortical scarring noted left kidney with 15 mm intermediate intensity lesion in the posterior interpolar region on T2 imaging (23/15). No hydronephrosis. Stomach/Bowel: Moderate to large hiatal hernia. No small bowel or colonic dilatation. Vascular/Lymphatic: No abdominal aortic aneurysm. Normal flow signal is seen in the portal vein and superior mesenteric vein.  No lymphadenopathy. Other: Edema is identified around  the descending duodenum and pylorus. Fluid tracks into Morison's pouch and is seen in the right paracolic gutter. Musculoskeletal: No abnormal marrow signal within the visualized bony anatomy. IMPRESSION: 1. Prominent edema in and around the pancreatic head involving the duodenal bulb and descending duodenum with edema/fluid tracking in the right retroperitoneal space and into the right paracolic gutter. Imaging features are compatible with the reported clinical history of pancreatitis. 2. Large gallstones with mild prominence of the intrahepatic bile ducts and upper normal for age diameter of the common bile duct. No findings of choledocholithiasis. 3. 15 mm indeterminate lesion interpolar left kidney. Consider follow-up MRI without and with contrast (renal function permitting) in 3 months. 4. Moderate to large hiatal hernia. Electronically Signed   By: Misty Stanley M.D.   On: 09/12/2018 12:59   US Abdomen Limited Ruq  Result Date: 09/12/2018 CLINICAL DATA:  Epigastric pain for 1 day.  Pancreatitis. EXAM: ULTRASOUND ABDOMEN LIMITED RIGHT UPPER QUADRANT COMPARISON:  Concurrent abdominal CT. FINDINGS: Gallbladder: Large shadowing gallstones partially obscure the posterior wall, largest stone measures 4 cm. There is gallbladder wall thickening of 4 mm. No sonographic Murphy sign noted by sonographer. Common bile duct: Diameter: 6 mm, normal. Liver: 1.1 cm cyst in the right hepatic lobe. Within normal limits in parenchymal echogenicity. Portal vein is patent on color Doppler imaging with normal direction of blood flow towards the liver. IMPRESSION: 1. Large shadowing gallstones in the gallbladder which obscure the posterior wall. There is gallbladder wall thickening of 4 mm. Negative sonographic Murphy sign. 2. Incidental small right hepatic cyst. Electronically Signed   By: Keith Rake M.D.   On: 09/12/2018 02:50   Medications: I have reviewed the  patient's current medications. Scheduled Meds:  amLODipine  10 mg Oral Daily   enoxaparin (LOVENOX) injection  40 mg Subcutaneous Q24H   hydrALAZINE  50 mg Oral Daily   levothyroxine  50 mcg Oral Daily   sodium bicarbonate  650 mg Oral Daily   vitamin B-12  250 mcg Oral Daily   [START ON 09/15/2018] Vitamin D (Ergocalciferol)  50,000 Units Oral Weekly   Continuous Infusions:  sodium chloride 75 mL/hr at 09/13/18 1439   sodium chloride Stopped (09/12/18 0759)   cefTRIAXone (ROCEPHIN)  IV Stopped (09/13/18 0451)   PRN Meds:.sodium chloride, acetaminophen, albuterol, magnesium hydroxide, morphine injection, nitroGLYCERIN, ondansetron **OR** ondansetron (ZOFRAN) IV, oxyCODONE   Assessment: Active Problems:   Acute pancreatitis    Plan: This patient has presumed gallstone pancreatitis.  The patient was seen by surgery who recommended a cholecystectomy in 6-8 weeks.  The patient is improving and I would continue to recommend following her symptomatically for her pancreatitis.  The patient is not in need of any ERCP or intervention at this time.   LOS: 1 day   Karla Little 09/13/2018, 2:42 PM

## 2018-09-14 LAB — CBC
HCT: 35.3 % — ABNORMAL LOW (ref 36.0–46.0)
Hemoglobin: 11.8 g/dL — ABNORMAL LOW (ref 12.0–15.0)
MCH: 31.4 pg (ref 26.0–34.0)
MCHC: 33.4 g/dL (ref 30.0–36.0)
MCV: 93.9 fL (ref 80.0–100.0)
Platelets: 227 10*3/uL (ref 150–400)
RBC: 3.76 MIL/uL — ABNORMAL LOW (ref 3.87–5.11)
RDW: 12.8 % (ref 11.5–15.5)
WBC: 5.9 10*3/uL (ref 4.0–10.5)
nRBC: 0 % (ref 0.0–0.2)

## 2018-09-14 LAB — COMPREHENSIVE METABOLIC PANEL
ALT: 195 U/L — ABNORMAL HIGH (ref 0–44)
AST: 92 U/L — ABNORMAL HIGH (ref 15–41)
Albumin: 3.3 g/dL — ABNORMAL LOW (ref 3.5–5.0)
Alkaline Phosphatase: 155 U/L — ABNORMAL HIGH (ref 38–126)
Anion gap: 10 (ref 5–15)
BUN: 14 mg/dL (ref 8–23)
CO2: 18 mmol/L — ABNORMAL LOW (ref 22–32)
Calcium: 8.9 mg/dL (ref 8.9–10.3)
Chloride: 108 mmol/L (ref 98–111)
Creatinine, Ser: 1.09 mg/dL — ABNORMAL HIGH (ref 0.44–1.00)
GFR calc Af Amer: 54 mL/min — ABNORMAL LOW (ref >60)
GFR calc non Af Amer: 47 mL/min — ABNORMAL LOW (ref >60)
Glucose, Bld: 103 mg/dL — ABNORMAL HIGH (ref 70–99)
Potassium: 3.7 mmol/L (ref 3.5–5.1)
Sodium: 136 mmol/L (ref 135–145)
Total Bilirubin: 0.8 mg/dL (ref 0.3–1.2)
Total Protein: 6.3 g/dL — ABNORMAL LOW (ref 6.5–8.1)

## 2018-09-14 LAB — LIPASE, BLOOD: Lipase: 274 U/L — ABNORMAL HIGH (ref 11–51)

## 2018-09-14 MED ORDER — TRAMADOL HCL 50 MG PO TABS: 50.0000 mg | ORAL_TABLET | Freq: Two times a day (BID) | ORAL | 0 refills | Status: DC | PRN

## 2018-09-14 NOTE — Discharge Summary (Signed)
[place] [facility] [place] NAME: Karla Little    MR#:  [id number]  DATE OF BIRTH:  02-Nov-1933  DATE OF ADMISSION:  09/11/2018   ADMITTING PHYSICIAN: Christel Mormon, MD  DATE OF DISCHARGE: 09/14/2018 11:35 AM  PRIMARY CARE PHYSICIAN: [name], MD   ADMISSION DIAGNOSIS:  Pelvic mass [R19.00] Epigastric pain [R10.13] Uterine mass [N85.8] Acute pancreatitis, unspecified complication status, unspecified pancreatitis type [K85.90] DISCHARGE DIAGNOSIS:  Active Problems:   Acute pancreatitis  SECONDARY DIAGNOSIS:   Past Medical History:  Diagnosis Date  . Chronic kidney disease   . Hyperlipidemia   . Hypertension   . Hypothyroidism   . Osteoporosis   . Renal insufficiency   . Vertigo    HOSPITAL COURSE:   Karla Little is an 85 year old female who presented to the ED with acute onset right upper quadrant abdominal pain and epigastric abdominal pain.  In the ED, she was noted to have serum lipase 3433, as well as elevated AST and ALT.  CT abdomen pelvis showed acute edematous pancreatitis and large gallstones with mild gallbladder thickening.  Common bile duct was 8 mm, upper limit of normal for age.  She was admitted for further management.  Gallstone pancreatitis- pain greatly improved on discharge -MRCP 4/10 without evidence of choledocholithiasis- may have had a stone that she passed. -Seen by surgery, who recommends cholecystectomy in 6 to 8 weeks.  Will need to follow-up with them as an outpatient in 4 to 6 weeks.  Left kidney lesion- incidental finding seen on MRCP. -Needs follow-up MRI with and without contrast in 3 months  Uterine fibroids and increased endometrial thickness- seen on pelvic US in the ED -Needs to follow-up with gynecology as an outpatient for endometrial biopsy  Stage III-IV chronic kidney disease- creatinine improved -Lisinopril initially held, but was restarted when creatinine improved -Continued serum  bicarbonate  Hypertension- blood pressures mildly elevated -Continued lisinopril, hydralazine, Norvasc  DISCHARGE CONDITIONS:  Gallstone pancreatitis Left kidney lesion Uterine fibroids and increased endometrial thickness Stage III chronic kidney disease Hypertension CONSULTS OBTAINED:  Treatment Team:  Lucilla Lame, MD Jules Husbands, MD DRUG ALLERGIES:   Allergies  Allergen Reactions  . Amoxicillin Rash   DISCHARGE MEDICATIONS:   Allergies as of 09/14/2018      Reactions   Amoxicillin Rash      Medication List    TAKE these medications   albuterol 108 (90 Base) MCG/ACT inhaler Commonly known as:  PROVENTIL HFA;VENTOLIN HFA Inhale 2 puffs into the lungs every 6 (six) hours as needed for wheezing or shortness of breath.   amLODipine 10 MG tablet Commonly known as:  NORVASC Take 1 tablet (10 mg total) by mouth daily.   calcitRIOL 0.25 MCG capsule Commonly known as:  ROCALTROL Take 0.25 mcg by mouth daily.   hydrALAZINE 25 MG tablet Commonly known as:  APRESOLINE Take 2 tablets (50 mg total) by mouth daily.   levothyroxine 50 MCG tablet Commonly known as:  SYNTHROID, LEVOTHROID Take 1 tablet (50 mcg total) by mouth daily.   lisinopril 10 MG tablet Commonly known as:  PRINIVIL,ZESTRIL Take 1 tablet (10 mg total) by mouth daily.   nitroGLYCERIN 0.4 MG SL tablet Commonly known as:  NITROSTAT Place 0.4 mg under the tongue every 5 (five) minutes as needed for chest pain.   traMADol 50 MG tablet Commonly known as:  ULTRAM Take 1 tablet (50 mg total) by mouth every 12 (twelve) hours as needed for severe pain.  vitamin B-12 250 MCG tablet Commonly known as:  CYANOCOBALAMIN Take 250 mcg by mouth daily.   Vitamin D (Ergocalciferol) 1.25 MG (50000 UT) Caps capsule Commonly known as:  DRISDOL Take 50,000 Units by mouth every 7 (seven) days.   vitamin E 200 UNIT capsule Take 200 Units by mouth daily.        DISCHARGE INSTRUCTIONS:  1.  Follow-up with  PCP in 5 days 2.  Follow-up with surgery in 4 weeks 3.  Needs MRI abdomen with and without contrast for further evaluation of left kidney lesion in 1 month 4.  Needs OB/GYN referral due to uterine fibroids and increased endometrial thickening seen on pelvic ultrasound DIET:  Cardiac diet DISCHARGE CONDITION:  Stable ACTIVITY:  Activity as tolerated OXYGEN:  Home Oxygen: No.  Oxygen Delivery: room air DISCHARGE LOCATION:  home   If you experience worsening of your admission symptoms, develop shortness of breath, life threatening emergency, suicidal or homicidal thoughts you must seek medical attention immediately by calling 911 or calling your MD immediately  if symptoms less severe.  You Must read complete instructions/literature along with all the possible adverse reactions/side effects for all the Medicines you take and that have been prescribed to you. Take any new Medicines after you have completely understood and accpet all the possible adverse reactions/side effects.   Please note  You were cared for by a hospitalist during your hospital stay. If you have any questions about your discharge medications or the care you received while you were in the hospital after you are discharged, you can call the unit and asked to speak with the hospitalist on call if the hospitalist that took care of you is not available. Once you are discharged, your primary care physician will handle any further medical issues. Please note that NO REFILLS for any discharge medications will be authorized once you are discharged, as it is imperative that you return to your primary care physician (or establish a relationship with a primary care physician if you do not have one) for your aftercare needs so that they can reassess your need for medications and monitor your lab values.    On the day of Discharge:  VITAL SIGNS:  Blood pressure (!) 168/66, pulse 79, temperature 99.5 F (37.5 C), temperature source  Oral, resp. rate 18, height 5\' 6"  (1.676 m), weight 75.3 kg, SpO2 96 %. PHYSICAL EXAMINATION:  GENERAL:  85 y.o.-year-old patient lying in the bed with no acute distress.  EYES: Pupils equal, round, reactive to light and accommodation. No scleral icterus. Extraocular muscles intact.  HEENT: Head atraumatic, normocephalic. Oropharynx and nasopharynx clear.  NECK:  Supple, no jugular venous distention. No thyroid enlargement, no tenderness.  LUNGS: Normal breath sounds bilaterally, no wheezing, rales,rhonchi or crepitation. No use of accessory muscles of respiration.  CARDIOVASCULAR: S1, S2 normal. No murmurs, rubs, or gallops.  ABDOMEN: Soft, non-distended. Bowel sounds present. No organomegaly or mass. + Mild epigastric tenderness to palpation. EXTREMITIES: No pedal edema, cyanosis, or clubbing.  NEUROLOGIC: Cranial nerves II through XII are intact. Muscle strength 5/5 in all extremities. Sensation intact. Gait not checked.  PSYCHIATRIC: The patient is alert and oriented x 3.  SKIN: No obvious rash, lesion, or ulcer.  DATA REVIEW:   CBC Recent Labs  Lab 09/14/18 0527  WBC 5.9  HGB 11.8*  HCT 35.3*  PLT 227    Chemistries  Recent Labs  Lab 09/14/18 0527  NA 136  K 3.7  CL 108  CO2 18*  GLUCOSE 103*  BUN 14  CREATININE 1.09*  CALCIUM 8.9  AST 92*  ALT 195*  ALKPHOS 155*  BILITOT 0.8     Microbiology Results  Results for orders placed or performed during the hospital encounter of 09/11/18  Urine Culture     Status: Abnormal   Collection Time: 09/12/18  5:07 AM  Result Value Ref Range Status   Specimen Description   Final    URINE, RANDOM Performed at [facility], [address] [place], [place] [address]    Special Requests   Final    NONE Performed at [facility], [address] [place], [place] [address]    Culture MULTIPLE SPECIES PRESENT, SUGGEST RECOLLECTION (A)  Final   Report Status 09/13/2018 FINAL  Final    RADIOLOGY:  No  results found.   Management plans discussed with the patient, family and they are in agreement.  CODE STATUS: DNR   TOTAL TIME TAKING CARE OF THIS PATIENT: 45 minutes.    Berna Spare  M.D on 09/14/2018 at 2:19 PM  Between 7am to 6pm - Pager - [phone]  After 6pm go to [email or web address] - Proofreader  [facility]  Office  [phone]  CC: Primary care physician; [name], MD   Note: This dictation was prepared with Dragon dictation along with smaller phrase technology. Any transcriptional errors that result from this process are unintentional.

## 2018-09-14 NOTE — Progress Notes (Signed)
Pt discharged per MD order. IV removed. Discharge instructions reviewed with pt. Pt verbalized understanding of instructions. Prescription given to pt. All questions answered to pt satisfaction. Pt taken downstairs by staff.

## 2018-09-14 NOTE — Discharge Instructions (Signed)
It was so nice to meet you during this hospitalization!  You came into the hospital with abdominal pain. We found that you had inflammation of your pancreas, which was probably caused by a gallbladder stone that got stuck in the duct that drains the pancreas. We did an MRI of your gallbladder and pancreas, and it did not show a stone, so you probably passed it on your own.  Here are some instructions to remember when you go home: 1. You should take tylenol as needed for mild or moderate pain, and you can take tramadol as needed for severe pain 2. Please make sure you follow-up with Dr. Dahlia Byes (the general surgeon) in 4 weeks to schedule your gallbladder removal 3. You need to have an MRI of your abdomen with and without contrast in 1 month to take another look at the lesion on your kidney- your primary care doctor or your kidney doctor (Dr. Holley Raring) can order this. 4. Please call [facility] to schedule an appointment- you had two fibroids (benign tumors) and some thickening of your uterine lining, which needs to be looked at.  Take care, Dr. Brett Albino

## 2018-09-18 ENCOUNTER — Encounter: Payer: Self-pay | Admitting: Family Medicine

## 2018-09-18 ENCOUNTER — Other Ambulatory Visit: Payer: Self-pay

## 2018-09-18 ENCOUNTER — Ambulatory Visit (INDEPENDENT_AMBULATORY_CARE_PROVIDER_SITE_OTHER): Payer: Medicare HMO | Admitting: Family Medicine

## 2018-09-18 DIAGNOSIS — K859 Acute pancreatitis without necrosis or infection, unspecified: Secondary | ICD-10-CM

## 2018-09-18 DIAGNOSIS — I10 Essential (primary) hypertension: Secondary | ICD-10-CM

## 2018-09-18 DIAGNOSIS — N184 Chronic kidney disease, stage 4 (severe): Secondary | ICD-10-CM | POA: Diagnosis not present

## 2018-09-18 NOTE — Assessment & Plan Note (Signed)
The current medical regimen is effective;  continue present plan and medications.  

## 2018-09-18 NOTE — Assessment & Plan Note (Signed)
Secondary to gallstones probably past has gallbladder surgery pending in 1 to 2 months pending COVID-19 pandemic

## 2018-09-18 NOTE — Progress Notes (Signed)
There were no vitals taken for this visit.   Subjective:    Patient ID: Karla Little, female    DOB: [birth year], 85 y.o.   MRN: [id number]  HPI: Karla Little is a 85 y.o. female  Hospital f/u [place] for a synchronous communication visit. Today's visit due to COVID-19 isolation precautions I connected with and verified that I am speaking with the correct person using two identifiers.   I discussed the limitations, risks, security and privacy concerns of performing an evaluation and management service by telecommunication and the availability of in person appointments. I also discussed with the patient that there may be a patient responsible charge related to this service. The patient expressed understanding and agreed to proceed. The patient's location is home. I am at home.  Discussed patient taking appropriate COVID-19 precautions family is not even visiting but taking care of her from a far. Patient doing well is pain-free no abdominal complaints. Has discussed with GYN and no endometrial biopsy is necessary. Understands renal lesion and follow-up MRI in 3 months. Discussed renal status and surgery. Discussed surgery for gallbladder. Transition of Care Hospital Follow up.   Hospital/Facility:[facility] D/C Physician: [facility] Date: 09-14-2018  Records Requested:  na Records Received: na Records Reviewed: today  Diagnoses on Discharge: Acute cholelithiasis with gallstone pancreatitis. Left kidney lesion incidentally found with follow-up MRI in 3 months Uterine fibroids which have been followed up with no further intervention. CKD stable by discharge.  Date of interactive Contact within 48 hours of discharge: Done Contact was through: Phone  Date of 7 day or 14 day face-to-face visit:    Today within 7 days  Outpatient Encounter Medications as of 09/18/2018  Medication Sig  . albuterol (PROVENTIL HFA;VENTOLIN HFA) 108 (90  Base) MCG/ACT inhaler Inhale 2 puffs into the lungs every 6 (six) hours as needed for wheezing or shortness of breath.  Marland Kitchen amLODipine (NORVASC) 10 MG tablet Take 1 tablet (10 mg total) by mouth daily.  . calcitRIOL (ROCALTROL) 0.25 MCG capsule Take 0.25 mcg by mouth daily.   . hydrALAZINE (APRESOLINE) 25 MG tablet Take 2 tablets (50 mg total) by mouth daily.  Marland Kitchen levothyroxine (SYNTHROID, LEVOTHROID) 50 MCG tablet Take 1 tablet (50 mcg total) by mouth daily.  Marland Kitchen lisinopril (PRINIVIL,ZESTRIL) 10 MG tablet Take 1 tablet (10 mg total) by mouth daily.  . traMADol (ULTRAM) 50 MG tablet Take 1 tablet (50 mg total) by mouth every 12 (twelve) hours as needed for severe pain.  . vitamin B-12 (CYANOCOBALAMIN) 250 MCG tablet Take 250 mcg by mouth daily.  . Vitamin D, Ergocalciferol, (DRISDOL) 50000 UNITS CAPS capsule Take 50,000 Units by mouth every 7 (seven) days.   . vitamin E 200 UNIT capsule Take 200 Units by mouth daily.  . nitroGLYCERIN (NITROSTAT) 0.4 MG SL tablet Place 0.4 mg under the tongue every 5 (five) minutes as needed for chest pain.   No facility-administered encounter medications on file as of 09/18/2018.     Diagnostic Tests Reviewed/Disposition: Done  Consults: Reviewed  Discharge Instructions reviewed with patient  Disease/illness Education: None  Home Health/Community Services Discussions/Referrals: Reviewed  Establishment or re-establishment of referral orders for community resources: Reviewed  Discussion with other health care providers: N/A  Assessment and Support of treatment regimen adherence: Discussed  Appointments Coordinated with: N/A  Education for self-management, independent living, and ADLs: Done Relevant past medical, surgical, family and social history reviewed and updated as indicated. Interim medical history since our last  visit reviewed. Allergies and medications reviewed and updated.  Review of Systems  Constitutional: Negative.   Respiratory: Negative.    Cardiovascular: Negative.     Per HPI unless specifically indicated above     Objective:    There were no vitals taken for this visit.  Wt Readings from Last 3 Encounters:  09/12/18 166 lb 0.1 oz (75.3 kg)  07/02/18 178 lb 12.8 oz (81.1 kg)  12/25/17 183 lb 12.8 oz (83.4 kg)    Physical Exam  Results for orders placed or performed during the hospital encounter of 09/11/18  Urine Culture  Result Value Ref Range   Specimen Description      URINE, RANDOM Performed at [facility], [address] [place], [place] [address]    Special Requests      NONE Performed at [facility], [address] [place], [place] [address]    Culture MULTIPLE SPECIES PRESENT, SUGGEST RECOLLECTION (A)    Report Status 09/13/2018 FINAL   Basic metabolic panel  Result Value Ref Range   Sodium 134 (L) 135 - 145 mmol/L   Potassium 4.2 3.5 - 5.1 mmol/L   Chloride 107 98 - 111 mmol/L   CO2 17 (L) 22 - 32 mmol/L   Glucose, Bld 155 (H) 70 - 99 mg/dL   BUN 30 (H) 8 - 23 mg/dL   Creatinine, Ser 1.62 (H) 0.44 - 1.00 mg/dL   Calcium 9.9 8.9 - 10.3 mg/dL   GFR calc non Af Amer 29 (L) >60 mL/min   GFR calc Af Amer 34 (L) >60 mL/min   Anion gap 10 5 - 15  Hepatic function panel  Result Value Ref Range   Total Protein 7.3 6.5 - 8.1 g/dL   Albumin 3.8 3.5 - 5.0 g/dL   AST 741 (H) 15 - 41 U/L   ALT 371 (H) 0 - 44 U/L   Alkaline Phosphatase 155 (H) 38 - 126 U/L   Total Bilirubin 0.7 0.3 - 1.2 mg/dL   Bilirubin, Direct 0.3 (H) 0.0 - 0.2 mg/dL   Indirect Bilirubin 0.4 0.3 - 0.9 mg/dL  Lipase, blood  Result Value Ref Range   Lipase 3,433 (H) 11 - 51 U/L  Troponin I - Once  Result Value Ref Range   Troponin I <0.03 <0.03 ng/mL  CBC with Differential  Result Value Ref Range   WBC 5.9 4.0 - 10.5 K/uL   RBC 3.98 3.87 - 5.11 MIL/uL   Hemoglobin 12.5 12.0 - 15.0 g/dL   HCT 37.4 36.0 - 46.0 %   MCV 94.0 80.0 - 100.0 fL   MCH 31.4 26.0 - 34.0 pg   MCHC 33.4 30.0 - 36.0 g/dL   RDW  12.7 11.5 - 15.5 %   Platelets 189 150 - 400 K/uL   nRBC 0.0 0.0 - 0.2 %   Neutrophils Relative % 85 %   Neutro Abs 5.2 1.7 - 7.7 K/uL   Lymphocytes Relative 7 %   Lymphs Abs 0.4 (L) 0.7 - 4.0 K/uL   Monocytes Relative 8 %   Monocytes Absolute 0.5 0.1 - 1.0 K/uL   Eosinophils Relative 0 %   Eosinophils Absolute 0.0 0.0 - 0.5 K/uL   Basophils Relative 0 %   Basophils Absolute 0.0 0.0 - 0.1 K/uL   Immature Granulocytes 0 %   Abs Immature Granulocytes 0.01 0.00 - 0.07 K/uL  Urinalysis, Complete w Microscopic  Result Value Ref Range   Color, Urine STRAW (A) YELLOW   APPearance CLEAR (  A) CLEAR   Specific Gravity, Urine 1.006 1.005 - 1.030   pH 6.0 5.0 - 8.0   Glucose, UA NEGATIVE NEGATIVE mg/dL   Hgb urine dipstick SMALL (A) NEGATIVE   Bilirubin Urine NEGATIVE NEGATIVE   Ketones, ur NEGATIVE NEGATIVE mg/dL   Protein, ur NEGATIVE NEGATIVE mg/dL   Nitrite NEGATIVE NEGATIVE   Leukocytes,Ua TRACE (A) NEGATIVE   RBC / HPF 0-5 0 - 5 RBC/hpf   WBC, UA 6-10 0 - 5 WBC/hpf   Bacteria, UA MANY (A) NONE SEEN   Squamous Epithelial / LPF 0-5 0 - 5   WBC Clumps PRESENT   Comprehensive metabolic panel  Result Value Ref Range   Sodium 136 135 - 145 mmol/L   Potassium 4.0 3.5 - 5.1 mmol/L   Chloride 109 98 - 111 mmol/L   CO2 18 (L) 22 - 32 mmol/L   Glucose, Bld 119 (H) 70 - 99 mg/dL   BUN 26 (H) 8 - 23 mg/dL   Creatinine, Ser 1.48 (H) 0.44 - 1.00 mg/dL   Calcium 9.3 8.9 - 10.3 mg/dL   Total Protein 7.1 6.5 - 8.1 g/dL   Albumin 3.7 3.5 - 5.0 g/dL   AST 424 (H) 15 - 41 U/L   ALT 343 (H) 0 - 44 U/L   Alkaline Phosphatase 146 (H) 38 - 126 U/L   Total Bilirubin 0.4 0.3 - 1.2 mg/dL   GFR calc non Af Amer 32 (L) >60 mL/min   GFR calc Af Amer 38 (L) >60 mL/min   Anion gap 9 5 - 15  CBC  Result Value Ref Range   WBC 4.9 4.0 - 10.5 K/uL   RBC 4.01 3.87 - 5.11 MIL/uL   Hemoglobin 12.7 12.0 - 15.0 g/dL   HCT 37.7 36.0 - 46.0 %   MCV 94.0 80.0 - 100.0 fL   MCH 31.7 26.0 - 34.0 pg   MCHC 33.7  30.0 - 36.0 g/dL   RDW 12.6 11.5 - 15.5 %   Platelets 219 150 - 400 K/uL   nRBC 0.0 0.0 - 0.2 %  Lipase, blood  Result Value Ref Range   Lipase 2,727 (H) 11 - 51 U/L  Hepatitis panel, acute  Result Value Ref Range   Hepatitis B Surface Ag Negative Negative   HCV Ab <0.1 0.0 - 0.9 s/co ratio   Hep A IgM Negative Negative   Hep B C IgM Negative Negative  Comprehensive metabolic panel  Result Value Ref Range   Sodium 137 135 - 145 mmol/L   Potassium 4.0 3.5 - 5.1 mmol/L   Chloride 110 98 - 111 mmol/L   CO2 20 (L) 22 - 32 mmol/L   Glucose, Bld 104 (H) 70 - 99 mg/dL   BUN 18 8 - 23 mg/dL   Creatinine, Ser 1.20 (H) 0.44 - 1.00 mg/dL   Calcium 8.8 (L) 8.9 - 10.3 mg/dL   Total Protein 6.4 (L) 6.5 - 8.1 g/dL   Albumin 3.3 (L) 3.5 - 5.0 g/dL   AST 120 (H) 15 - 41 U/L   ALT 187 (H) 0 - 44 U/L   Alkaline Phosphatase 125 38 - 126 U/L   Total Bilirubin 0.5 0.3 - 1.2 mg/dL   GFR calc non Af Amer 42 (L) >60 mL/min   GFR calc Af Amer 48 (L) >60 mL/min   Anion gap 7 5 - 15  CBC  Result Value Ref Range   WBC 6.2 4.0 - 10.5 K/uL   RBC 3.65 (L) 3.87 -  5.11 MIL/uL   Hemoglobin 11.5 (L) 12.0 - 15.0 g/dL   HCT 35.0 (L) 36.0 - 46.0 %   MCV 95.9 80.0 - 100.0 fL   MCH 31.5 26.0 - 34.0 pg   MCHC 32.9 30.0 - 36.0 g/dL   RDW 13.1 11.5 - 15.5 %   Platelets 225 150 - 400 K/uL   nRBC 0.0 0.0 - 0.2 %  Lipase, blood  Result Value Ref Range   Lipase 1,392 (H) 11 - 51 U/L  Comprehensive metabolic panel  Result Value Ref Range   Sodium 136 135 - 145 mmol/L   Potassium 3.7 3.5 - 5.1 mmol/L   Chloride 108 98 - 111 mmol/L   CO2 18 (L) 22 - 32 mmol/L   Glucose, Bld 103 (H) 70 - 99 mg/dL   BUN 14 8 - 23 mg/dL   Creatinine, Ser 1.09 (H) 0.44 - 1.00 mg/dL   Calcium 8.9 8.9 - 10.3 mg/dL   Total Protein 6.3 (L) 6.5 - 8.1 g/dL   Albumin 3.3 (L) 3.5 - 5.0 g/dL   AST 92 (H) 15 - 41 U/L   ALT 195 (H) 0 - 44 U/L   Alkaline Phosphatase 155 (H) 38 - 126 U/L   Total Bilirubin 0.8 0.3 - 1.2 mg/dL   GFR calc  non Af Amer 47 (L) >60 mL/min   GFR calc Af Amer 54 (L) >60 mL/min   Anion gap 10 5 - 15  CBC  Result Value Ref Range   WBC 5.9 4.0 - 10.5 K/uL   RBC 3.76 (L) 3.87 - 5.11 MIL/uL   Hemoglobin 11.8 (L) 12.0 - 15.0 g/dL   HCT 35.3 (L) 36.0 - 46.0 %   MCV 93.9 80.0 - 100.0 fL   MCH 31.4 26.0 - 34.0 pg   MCHC 33.4 30.0 - 36.0 g/dL   RDW 12.8 11.5 - 15.5 %   Platelets 227 150 - 400 K/uL   nRBC 0.0 0.0 - 0.2 %  Lipase, blood  Result Value Ref Range   Lipase 274 (H) 11 - 51 U/L      Assessment & Plan:   Problem List Items Addressed This Visit      Cardiovascular and Mediastinum   Hypertension    The current medical regimen is effective;  continue present plan and medications.         Digestive   Acute pancreatitis    Secondary to gallstones probably past has gallbladder surgery pending in 1 to 2 months pending COVID-19 pandemic        Genitourinary   Chronic kidney disease, stage 4, severely decreased GFR (HCC)    The current medical regimen is effective;  continue present plan and medications.          I discussed the assessment and treatment plan with the patient. The patient was provided an opportunity to ask questions and all were answered. The patient agreed with the plan and demonstrated an understanding of the instructions.   The patient was advised to call back or seek an in-person evaluation if the symptoms worsen or if the condition fails to improve as anticipated.   I provided 21+ minutes of time during this encounter. Follow up plan: Return in about 3 months (around 12/18/2018).

## 2018-09-26 DIAGNOSIS — N2581 Secondary hyperparathyroidism of renal origin: Secondary | ICD-10-CM | POA: Diagnosis not present

## 2018-09-26 DIAGNOSIS — I10 Essential (primary) hypertension: Secondary | ICD-10-CM | POA: Diagnosis not present

## 2018-09-26 DIAGNOSIS — N183 Chronic kidney disease, stage 3 (moderate): Secondary | ICD-10-CM | POA: Diagnosis not present

## 2018-09-26 DIAGNOSIS — E872 Acidosis: Secondary | ICD-10-CM | POA: Diagnosis not present

## 2018-10-13 DIAGNOSIS — N2581 Secondary hyperparathyroidism of renal origin: Secondary | ICD-10-CM | POA: Diagnosis not present

## 2018-10-13 DIAGNOSIS — E872 Acidosis: Secondary | ICD-10-CM | POA: Diagnosis not present

## 2018-10-13 DIAGNOSIS — I10 Essential (primary) hypertension: Secondary | ICD-10-CM | POA: Diagnosis not present

## 2018-10-13 DIAGNOSIS — N184 Chronic kidney disease, stage 4 (severe): Secondary | ICD-10-CM | POA: Diagnosis not present

## 2018-10-15 ENCOUNTER — Ambulatory Visit: Payer: Self-pay | Admitting: Surgery

## 2018-10-20 ENCOUNTER — Other Ambulatory Visit: Payer: Self-pay

## 2018-10-20 ENCOUNTER — Telehealth (INDEPENDENT_AMBULATORY_CARE_PROVIDER_SITE_OTHER): Payer: Medicare HMO | Admitting: Surgery

## 2018-10-20 DIAGNOSIS — K851 Biliary acute pancreatitis without necrosis or infection: Secondary | ICD-10-CM

## 2018-10-20 NOTE — Progress Notes (Signed)
Telemedicine Surgical Follow Up  10/20/2018  Karla Little is an 85 y.o. female.   No chief complaint on file.   HPI:  Mrs Karla Little an 85 year old female following night for gallstone pancreatitis.  She was hospitalized 5 weeks ago and was managed medically.  Her abdominal pain has improved.  She is taking p.o.  No fevers no chills no evidence of recurrent pancreatitis.  Ultrasound personally reviewed showing evidence of gallstones. Did have an MRCP showing no evidence of choledocholithiasis. SHe is gaining her strength back.  She is taking p.o. Location of the patient:home Time spent on call: 14 min Total Time spent in the encounter including counseling and coordination of care: 22 Location of the Provider: Office The patient had given consent for a telemedicine visit and they understands the limitations associated with this including but not limited to privacy, cyber security and technology issues. Persons participating in the visit: Pt and daughter Phone call   Past Medical History:  Diagnosis Date  . Chronic kidney disease   . Hyperlipidemia   . Hypertension   . Hypothyroidism   . Osteoporosis   . Renal insufficiency   . Vertigo     Past Surgical History:  Procedure Laterality Date  . BREAST BIOPSY    . COLONOSCOPY W/ POLYPECTOMY    . COLONOSCOPY WITH PROPOFOL N/A 06/02/2015   Procedure: COLONOSCOPY WITH PROPOFOL;  Surgeon: Manya Silvas, MD;  Location: [facility] ENDOSCOPY;  Service: Endoscopy;  Laterality: N/A;    Family History  Problem Relation Age of Onset  . Hypertension Mother   . Heart disease Father   . Cancer Sister        breast    Social History:  reports that she has never smoked. She has never used smokeless tobacco. She reports that she does not drink alcohol or use drugs.  Allergies:  Allergies  Allergen Reactions  . Amoxicillin Rash    Medications reviewed.    ROS Full ROS performed and is otherwise negative other than what is stated  in HPI   Assessment/Plan: 85 year old female with previous evidence of gallstone pancreatitis surgery was postponed due to the COVID 19 epidemic.  Currently she is doing better.  I have asked her to have a face-to-face consultation with me next week so we can set her up for elective cholecystectomy. Discussed with patient in detail her daughter about her disease process and the indication for cholecystectomy given evidence of prior gallstone pancreatitis. The pt was provided an opportunity to ask questions and all were answered. The pt was advised to call back or seek in-person evaluation if the symptoms worsen or if the condition fails to improve as anticipated. Greater than 50% of the 22 minutes  visit was spent in counseling/coordination of care   Caroleen Hamman, MD [place] Surgeon

## 2018-10-22 DIAGNOSIS — I10 Essential (primary) hypertension: Secondary | ICD-10-CM | POA: Diagnosis not present

## 2018-10-22 DIAGNOSIS — N2581 Secondary hyperparathyroidism of renal origin: Secondary | ICD-10-CM | POA: Diagnosis not present

## 2018-10-22 DIAGNOSIS — E872 Acidosis: Secondary | ICD-10-CM | POA: Diagnosis not present

## 2018-10-22 DIAGNOSIS — N184 Chronic kidney disease, stage 4 (severe): Secondary | ICD-10-CM | POA: Diagnosis not present

## 2018-10-30 ENCOUNTER — Encounter: Payer: Self-pay | Admitting: Surgery

## 2018-10-30 ENCOUNTER — Ambulatory Visit (INDEPENDENT_AMBULATORY_CARE_PROVIDER_SITE_OTHER): Payer: Medicare HMO | Admitting: Surgery

## 2018-10-30 ENCOUNTER — Other Ambulatory Visit: Payer: Self-pay

## 2018-10-30 VITALS — BP 177/83 | HR 92 | Temp 97.7°F | Resp 16 | Ht 69.0 in | Wt 166.2 lb

## 2018-10-30 DIAGNOSIS — K851 Biliary acute pancreatitis without necrosis or infection: Secondary | ICD-10-CM | POA: Diagnosis not present

## 2018-10-30 NOTE — Progress Notes (Signed)
Outpatient Surgical Follow Up  10/30/2018  Karla Little is an 85 y.o. female.   Chief Complaint  Patient presents with  . Follow-up    Gallstones /pancreatitis    HPI: 85 year old female well-known to me with a prior history of gallstone pancreatitis was [place] expectantly in the hospital.  Due to COVID-19 we postpone her cholecystectomy.  She is now ready has recovered from recent pancreatitis.  She is taking p.o.  No fevers no chills no runny nose.  Personally review her MRI as well as her CT and her ultrasound showing evidence of pancreatitis and cholelithiasis. No evidence of choledocholithiasis.  He is 42 but she is independent she is able to walk without assistance and she is still drives.  Past Medical History:  Diagnosis Date  . Chronic kidney disease   . Hyperlipidemia   . Hypertension   . Hypothyroidism   . Osteoporosis   . Renal insufficiency   . Vertigo     Past Surgical History:  Procedure Laterality Date  . BREAST BIOPSY    . COLONOSCOPY W/ POLYPECTOMY    . COLONOSCOPY WITH PROPOFOL N/A 06/02/2015   Procedure: COLONOSCOPY WITH PROPOFOL;  Surgeon: Manya Silvas, MD;  Location: [facility] ENDOSCOPY;  Service: Endoscopy;  Laterality: N/A;    Family History  Problem Relation Age of Onset  . Hypertension Mother   . Heart disease Father   . Cancer Sister        breast    Social History:  reports that she has never smoked. She has never used smokeless tobacco. She reports that she does not drink alcohol or use drugs.  Allergies:  Allergies  Allergen Reactions  . Amoxicillin Rash    Medications reviewed.    ROS Full ROS performed and is otherwise negative other than what is stated in HPI   BP (!) 177/83   Pulse 92   Temp 97.7 F (36.5 C) (Temporal)   Resp 16   Ht 5\' 9"  (1.753 m)   Wt 166 lb 3.2 oz (75.4 kg)   SpO2 97%   BMI 24.54 kg/m   Physical Exam Vitals signs and nursing note reviewed. Exam conducted with a chaperone present.   Constitutional:      Appearance: Normal appearance.  Eyes:     General: No scleral icterus.       Right eye: No discharge.        Left eye: No discharge.  Neck:     Musculoskeletal: Normal range of motion and neck supple. No neck rigidity or muscular tenderness.  Cardiovascular:     Rate and Rhythm: Normal rate and regular rhythm.     Heart sounds: No murmur. No friction rub.  Pulmonary:     Effort: Pulmonary effort is normal. No respiratory distress.     Breath sounds: Normal breath sounds. No stridor. No rhonchi.  Abdominal:     General: Abdomen is flat. There is no distension.     Palpations: There is no mass.     Tenderness: There is no abdominal tenderness.     Hernia: No hernia is present.  Musculoskeletal: Normal range of motion.        General: No swelling.  Lymphadenopathy:     Cervical: No cervical adenopathy.  Skin:    Capillary Refill: Capillary refill takes less than 2 seconds.  Neurological:     General: No focal deficit present.     Mental Status: She is alert and oriented to person, place, and time.  Psychiatric:        Mood and Affect: Mood normal.        Behavior: Behavior normal.        Thought Content: Thought content normal.        Judgment: Judgment normal.     Assessment/Plan:  85 yo hx of gallstone pancreatitis associated with gallstones at this time I do recommend cholecystectomy.The risks, benefits, complications, treatment options, and expected outcomes were discussed with the patient. The possibilities of bleeding, recurrent infection, finding a normal gallbladder, perforation of viscus organs, damage to surrounding structures, bile leak, abscess formation, needing a drain placed, the need for additional procedures, reaction to medication, pulmonary aspiration,  failure to diagnose a condition, the possible need to convert to an open procedure, and creating a complication requiring transfusion or operation were discussed with the patient. The  patient and/or family concurred with the proposed plan, giving informed consent.  Think that she will be a good candidate for robotic assisted cholecystectomy since this will allow [place] to perform ICG cholangiography and have better visualization. She is in agreement with this.  Caroleen Hamman, MD [facility] General Surgeon

## 2018-10-30 NOTE — Patient Instructions (Addendum)
Patient's surgery to be scheduled for 11/11/18 at [facility].  The patient is aware to have COVID-19 testing done on 11/07/18 at the [facility] building drive thru ([address] [place]) between 10:30 am and 12:30 pm. She is aware to isolate after, have no visitors, wash hands frequently, and avoid touching face.   The patient is aware she will need to Pre-Admit prior to surgery. Patient will check in at the [place] entrance due to COVID-19 restrictions and will then be escorted to the [place], [address] (first floor). Patient will be contacted once Pre-admission appointment has been arranged with date and time.   Patient aware to be NPO after midnight and have a driver.   She is aware to check in at the [place] entrance where he/she will be screened for the coronavirus and then sent to Same Day Surgery.   Patient aware that she may have no visitors and driver will need to wait in the car due to COVID-19 restrictions.   The patient verbalizes understanding of the above.   The patient is aware to call the office should he/she have further questions.    You will most likely be out of work 1-2 weeks for this surgery. You will return after your post-op appointment with a lifting restriction for approximately 4 more weeks.  You will be able to eat anything you would like to following surgery. But, start by eating a bland diet and advance this as tolerated. The Gallbladder diet is below, please go as closely by this diet as possible prior to surgery to avoid any further attacks.   If you have any questions, please call our office.  Laparoscopic Cholecystectomy Laparoscopic cholecystectomy is surgery to remove the gallbladder. The gallbladder is located in the upper right part of the abdomen, behind the liver. It is a storage sac for bile, which is produced in the liver. Bile aids in the digestion and absorption of fats. Cholecystectomy is often done for inflammation of  the gallbladder (cholecystitis). This condition is usually caused by a buildup of gallstones (cholelithiasis) in the gallbladder. Gallstones can block the flow of bile, and that can result in inflammation and pain. In severe cases, emergency surgery may be required. If emergency surgery is not required, you will have time to prepare for the procedure. Laparoscopic surgery is an alternative to open surgery. Laparoscopic surgery has a shorter recovery time. Your common bile duct may also need to be examined during the procedure. If stones are found in the common bile duct, they may be removed. LET [facility] CARE PROVIDER KNOW ABOUT:  Any allergies you have.  All medicines you are taking, including vitamins, herbs, eye drops, creams, and over-the-counter medicines.  Previous problems you or members of your family have had with the use of anesthetics.  Any blood disorders you have.  Previous surgeries you have had.    Any medical conditions you have. RISKS AND COMPLICATIONS Generally, this is a safe procedure. However, problems may occur, including:  Infection.  Bleeding.  Allergic reactions to medicines.  Damage to other structures or organs.  A stone remaining in the common bile duct.  A bile leak from the cyst duct that is clipped when your gallbladder is removed.  The need to convert to open surgery, which requires a larger incision in the abdomen. This may be necessary if your surgeon thinks that it is not safe to continue with a laparoscopic procedure. BEFORE THE PROCEDURE  Ask your  health care provider about:  Changing or stopping your regular medicines. This is especially important if you are taking diabetes medicines or blood thinners.  Taking medicines such as aspirin and ibuprofen. These medicines can thin your blood. Do not take these medicines before your procedure if your health care provider instructs you not to.  Follow instructions from your health care  provider about eating or drinking restrictions.  Let your health care provider know if you develop a cold or an infection before surgery.  Plan to have someone take you home after the procedure.  Ask your health care provider how your surgical site will be marked or identified.  You may be given antibiotic medicine to help prevent infection. PROCEDURE  To reduce your risk of infection:  Your health care team will wash or sanitize their hands.  Your skin will be washed with soap.  An IV tube may be inserted into one of your veins.  You will be given a medicine to make you fall asleep (general anesthetic).  A breathing tube will be placed in your mouth.  The surgeon will make several small cuts (incisions) in your abdomen.  A thin, lighted tube (laparoscope) that has a tiny camera on the end will be inserted through one of the small incisions. The camera on the laparoscope will send a picture to a TV screen (monitor) in the operating room. This will give the surgeon a good view inside your abdomen.  A gas will be pumped into your abdomen. This will expand your abdomen to give the surgeon more room to perform the surgery.  Other tools that are needed for the procedure will be inserted through the other incisions. The gallbladder will be removed through one of the incisions.  After your gallbladder has been removed, the incisions will be closed with stitches (sutures), staples, or skin glue.  Your incisions may be covered with a bandage (dressing). The procedure may vary among health care providers and hospitals. AFTER THE PROCEDURE  Your blood pressure, heart rate, breathing rate, and blood oxygen level will be monitored often until the medicines you were given have worn off.  You will be given medicines as needed to control your pain.   This information is not intended to replace advice given to you by your health care provider. Make sure you discuss any questions you have with  your health care provider.   Document Released: 05/21/2005 Document Revised: 02/09/2015 Document Reviewed: 12/31/2012 Elsevier Interactive Patient Education 2016 [place] Diet for Gallbladder Conditions A low-fat diet can be helpful if you have pancreatitis or a gallbladder condition. With these conditions, your pancreas and gallbladder have trouble digesting fats. A healthy eating plan with less fat will help rest your pancreas and gallbladder and reduce your symptoms. WHAT DO I NEED TO KNOW ABOUT THIS DIET?  Eat a low-fat diet.  Reduce your fat intake to less than 20-30% of your total daily calories. This is less than 50-60 g of fat per day.  Remember that you need some fat in your diet. Ask your dietician what your daily goal should be.  Choose nonfat and low-fat healthy foods. Look for the words "nonfat," "low fat," or "fat free."  As a guide, look on the label and choose foods with less than 3 g of fat per serving. Eat only one serving.  Avoid alcohol.  Do not smoke. If you need help quitting, talk with your health care provider.  Eat small frequent meals  instead of three large heavy meals. WHAT FOODS CAN I EAT? Grains Include healthy grains and starches such as potatoes, wheat bread, fiber-rich cereal, and brown rice. Choose whole grain options whenever possible. In adults, whole grains should account for 45-65% of your daily calories.  Fruits and Vegetables Eat plenty of fruits and vegetables. Fresh fruits and vegetables add fiber to your diet. Meats and Other Protein Sources Eat lean meat such as chicken and pork. Trim any fat off of meat before cooking it. Eggs, fish, and beans are other sources of protein. In adults, these foods should account for 10-35% of your daily calories. Dairy Choose low-fat milk and dairy options. Dairy includes fat and protein, as well as calcium.  Fats and Oils Limit high-fat foods such as fried foods, sweets, baked goods, sugary  drinks.  Other Creamy sauces and condiments, such as mayonnaise, can add extra fat. Think about whether or not you need to use them, or use smaller amounts or low fat options. WHAT FOODS ARE NOT RECOMMENDED?  High fat foods, such as:  [employer].  Ice cream.  [place] toast.  Sweet rolls.  Pizza.  Cheese bread.  Foods covered with batter, butter, creamy sauces, or cheese.  Fried foods.  Sugary drinks and desserts.  Foods that cause gas or bloating   This information is not intended to replace advice given to you by your health care provider. Make sure you discuss any questions you have with your health care provider.   Document Released: 05/26/2013 Document Reviewed: 05/26/2013 Elsevier Interactive Patient Education [employer].      Cholelithiasis  Cholelithiasis is also called "gallstones." It is a kind of gallbladder disease. The gallbladder is an organ that stores a liquid (bile) that helps you digest fat. Gallstones may not cause symptoms (may be silent gallstones) until they cause a blockage, and then they can cause pain (gallbladder attack). Follow these instructions at home:  Take over-the-counter and prescription medicines only as told by your doctor.  Stay at a healthy weight.  Eat healthy foods. This includes: ? Eating fewer fatty foods, like fried foods. ? Eating fewer refined carbs (refined carbohydrates). Refined carbs are breads and grains that are highly processed, like white bread and white rice. Instead, choose whole grains like whole-wheat bread and brown rice. ? Eating more fiber. Almonds, fresh fruit, and beans are healthy sources of fiber.  Keep all follow-up visits as told by your doctor. This is important. Contact a doctor if:  You have sudden pain in the upper right side of your belly (abdomen). Pain might spread to your right shoulder or your chest. This may be a sign of a gallbladder attack.  You feel sick to your stomach (are  nauseous).  You throw up (vomit).  You have been diagnosed with gallstones that have no symptoms and you get: ? Belly pain. ? Discomfort, burning, or fullness in the upper part of your belly (indigestion). Get help right away if:  You have sudden pain in the upper right side of your belly, and it lasts for more than 2 hours.  You have belly pain that lasts for more than 5 hours.  You have a fever or chills.  You keep feeling sick to your stomach or you keep throwing up.  Your skin or the whites of your eyes turn yellow (jaundice).  You have dark-colored pee (urine).  You have light-colored poop (stool). Summary  Cholelithiasis is also called "gallstones."  The gallbladder is an organ  that stores a liquid (bile) that helps you digest fat.  Silent gallstones are gallstones that do not cause symptoms.  A gallbladder attack may cause sudden pain in the upper right side of your belly. Pain might spread to your right shoulder or your chest. If this happens, contact your doctor.  If you have sudden pain in the upper right side of your belly that lasts for more than 2 hours, get help right away. This information is not intended to replace advice given to you by your health care provider. Make sure you discuss any questions you have with your health care provider. Document Released: 11/07/2007 Document Revised: 02/05/2016 Document Reviewed: 02/05/2016 Elsevier Interactive Patient Education  2019 [employer].

## 2018-10-31 ENCOUNTER — Telehealth: Payer: Self-pay | Admitting: *Deleted

## 2018-10-31 NOTE — Telephone Encounter (Signed)
Patient contacted today and aware that surgery has been scheduled for 11-11-18 at [facility] with Dr. Dahlia Byes.  The patient is aware she will need to Pre-Admit on 11-07-18 at 11:45 am. Patient will check in at the [place] entrance due to COVID-19 restrictions and will then be escorted to the [place], [address] (first floor). The patient is aware to have COVID-19 testing done at time of Pre-admission appointment.   The patient verbalizes understanding of the above.   The patient is aware to call the office should she have further questions.

## 2018-11-07 ENCOUNTER — Encounter
Admission: RE | Admit: 2018-11-07 | Discharge: 2018-11-07 | Disposition: A | Discharge status: Home / Self Care | Payer: Medicare HMO | Source: Ambulatory Visit | Attending: Surgery | Admitting: Surgery

## 2018-11-07 ENCOUNTER — Other Ambulatory Visit: Payer: Self-pay

## 2018-11-07 DIAGNOSIS — Z01812 Encounter for preprocedural laboratory examination: Secondary | ICD-10-CM | POA: Insufficient documentation

## 2018-11-07 DIAGNOSIS — Z1159 Encounter for screening for other viral diseases: Secondary | ICD-10-CM | POA: Insufficient documentation

## 2018-11-07 HISTORY — DX: Disorder of kidney and ureter, unspecified: N28.9

## 2018-11-07 HISTORY — DX: Hypo-osmolality and hyponatremia: E87.1

## 2018-11-07 LAB — SODIUM: Sodium: 128 mmol/L — ABNORMAL LOW (ref 135–145)

## 2018-11-07 NOTE — Patient Instructions (Signed)
Your procedure is scheduled on: 11-11-18 TUESDAY Report to Same Day Surgery 2nd floor medical mall [facility] Entrance-take elevator on left to 2nd floor.  Check in with surgery information desk.) To find out your arrival time please call [phone] between 1PM - 3PM on 11-10-18 MONDAY  Remember: Instructions that are not followed completely may result in serious medical risk, up to and including death, or upon the discretion of your surgeon and anesthesiologist your surgery may need to be rescheduled.    _x___ 1. Do not eat food after midnight the night before your procedure. NO GUM OR CANDY AFTER MIDNIGHT.  You may drink clear liquids up to 2 hours before you are scheduled to arrive at the hospital for your procedure.  Do not drink clear liquids within 2 hours of your scheduled arrival to the hospital.  Clear liquids include  --Water or Apple juice without pulp  --Clear carbohydrate beverage such as ClearFast or Gatorade  --Black Coffee or Clear Tea (No milk, no creamers, do not add anything to the coffee or Tea   ____Ensure clear carbohydrate drink on the way to the hospital for bariatric patients  ____Ensure clear carbohydrate drink 3 hours before surgery for Dr Dwyane Luo patients if physician instructed.    __x__ 2. No Alcohol for 24 hours before or after surgery.   __x__3. No Smoking or e-cigarettes for 24 prior to surgery.  Do not use any chewable tobacco products for at least 6 hour prior to surgery   ____  4. Bring all medications with you on the day of surgery if instructed.    __x__ 5. Notify your doctor if there is any change in your medical condition     (cold, fever, infections).    x___6. On the morning of surgery brush your teeth with toothpaste and water.  You may rinse your mouth with mouth wash if you wish.  Do not swallow any toothpaste or mouthwash.   Do not wear jewelry, make-up, hairpins, clips or nail polish.  Do not wear lotions, powders, or perfumes. You  may wear deodorant.  Do not shave 48 hours prior to surgery. Men may shave face and neck.  Do not bring valuables to the hospital.    [facility] is not responsible for any belongings or valuables.               Contacts, dentures or bridgework may not be worn into surgery.  Leave your suitcase in the car. After surgery it may be brought to your room.  For patients admitted to the hospital, discharge time is determined by your treatment team.  _  Patients discharged the day of surgery will not be allowed to drive home.  You will need someone to drive you home and stay with you the night of your procedure.    Please read over the following fact sheets that you were given:   [facility] Preparing for Surgery   _x___ TAKE THE FOLLOWING MEDICATION THE MORNING OF SURGERY WITH A SMALL SIP OF WATER. These include:  1. AMLODIPINE (NORVASC)  2. HYDRALAZINE (APRESOLINE)  3. LEVOTHYROXINE (SYNTHROID)  4.  5.  6.  ____Fleets enema or Magnesium Citrate as directed.   _x___ Use CHG Soap or sage wipes as directed on instruction sheet   ____ Use inhalers on the day of surgery and bring to hospital day of surgery  ____ Stop Metformin and Janumet 2 days prior to surgery.    ____ Take 1/2 of usual insulin  dose the night before surgery and none on the morning surgery.   ____ Follow recommendations from Cardiologist, Pulmonologist or PCP regarding  stopping Aspirin, Coumadin, Plavix ,Eliquis, Effient, or Pradaxa, and Pletal.  X____Stop Anti-inflammatories such as Advil, Aleve, Ibuprofen, Motrin, Naproxen, Naprosyn, Goodies powders or aspirin products NOW-OK to take Tylenol    ____ Stop supplements until after surgery.     ____ Bring C-Pap to the hospital.

## 2018-11-08 LAB — NOVEL CORONAVIRUS, NAA (HOSP ORDER, SEND-OUT TO REF LAB; TAT 18-24 HRS): SARS-CoV-2, NAA: NOT DETECTED

## 2018-11-10 ENCOUNTER — Other Ambulatory Visit: Payer: Self-pay

## 2018-11-10 ENCOUNTER — Telehealth: Payer: Self-pay

## 2018-11-10 ENCOUNTER — Other Ambulatory Visit
Admission: RE | Admit: 2018-11-10 | Discharge: 2018-11-10 | Disposition: A | Discharge status: Home / Self Care | Payer: Medicare HMO | Source: Ambulatory Visit | Attending: Surgery | Admitting: Surgery

## 2018-11-10 DIAGNOSIS — Z1159 Encounter for screening for other viral diseases: Secondary | ICD-10-CM | POA: Diagnosis not present

## 2018-11-10 DIAGNOSIS — Z01812 Encounter for preprocedural laboratory examination: Secondary | ICD-10-CM | POA: Diagnosis not present

## 2018-11-10 DIAGNOSIS — K851 Biliary acute pancreatitis without necrosis or infection: Secondary | ICD-10-CM

## 2018-11-10 LAB — SARS CORONAVIRUS 2 BY RT PCR (HOSPITAL ORDER, PERFORMED IN ~~LOC~~ HOSPITAL LAB): SARS Coronavirus 2: NEGATIVE

## 2018-11-10 NOTE — Telephone Encounter (Signed)
Patient notified that her Sodium level was low and that Dr Dahlia Byes wanted her to repeat a BMP. She will have this done today at [facility].

## 2018-11-10 NOTE — Telephone Encounter (Signed)
Spoke with the patient about having her lab testing done today. She is very upset due to the fact that the hospital lost her Covid test that she had done on 11/07/18. She then had to return to [facility] this morning to have repeat testing done that was very uncomfortable. She does not want to go yet again for more lab work. She is canceling her surgery for now and may reschedule in a month.  OR and Dr Dahlia Byes has been notified of cancellation.

## 2018-11-11 ENCOUNTER — Encounter: Admission: RE | Payer: Self-pay | Source: Home / Self Care

## 2018-11-11 ENCOUNTER — Ambulatory Visit: Admission: RE | Admit: 2018-11-11 | Payer: Medicare HMO | Source: Home / Self Care | Admitting: Surgery

## 2018-11-11 SURGERY — ROBOTIC ASSISTED LAPAROSCOPIC CHOLECYSTECTOMY
Anesthesia: General

## 2018-12-10 ENCOUNTER — Telehealth: Payer: Self-pay | Admitting: *Deleted

## 2018-12-10 NOTE — Telephone Encounter (Signed)
Message left on home and cell numbers for patient to call the office.   We need to see if patient would like to get her gallbladder surgery rescheduled.

## 2018-12-10 NOTE — Telephone Encounter (Signed)
Patient called the office back today.   She does not want to schedule surgery at this time.   Patient states she is waiting on kidney doctor.   The patient wishes to call the office when she is ready to proceed.

## 2018-12-15 ENCOUNTER — Ambulatory Visit: Payer: Medicare HMO | Admitting: Surgery

## 2018-12-22 ENCOUNTER — Ambulatory Visit: Payer: Medicare HMO | Admitting: Surgery

## 2018-12-23 DIAGNOSIS — I10 Essential (primary) hypertension: Secondary | ICD-10-CM | POA: Diagnosis not present

## 2018-12-23 DIAGNOSIS — N184 Chronic kidney disease, stage 4 (severe): Secondary | ICD-10-CM | POA: Diagnosis not present

## 2018-12-23 DIAGNOSIS — E872 Acidosis: Secondary | ICD-10-CM | POA: Diagnosis not present

## 2018-12-23 DIAGNOSIS — N2581 Secondary hyperparathyroidism of renal origin: Secondary | ICD-10-CM | POA: Diagnosis not present

## 2018-12-29 DIAGNOSIS — E872 Acidosis: Secondary | ICD-10-CM | POA: Diagnosis not present

## 2018-12-29 DIAGNOSIS — N2581 Secondary hyperparathyroidism of renal origin: Secondary | ICD-10-CM | POA: Diagnosis not present

## 2018-12-29 DIAGNOSIS — N183 Chronic kidney disease, stage 3 (moderate): Secondary | ICD-10-CM | POA: Diagnosis not present

## 2018-12-29 DIAGNOSIS — I10 Essential (primary) hypertension: Secondary | ICD-10-CM | POA: Diagnosis not present

## 2018-12-30 ENCOUNTER — Other Ambulatory Visit: Payer: Self-pay | Admitting: Nephrology

## 2018-12-30 DIAGNOSIS — N281 Cyst of kidney, acquired: Secondary | ICD-10-CM

## 2018-12-30 DIAGNOSIS — R918 Other nonspecific abnormal finding of lung field: Secondary | ICD-10-CM

## 2018-12-30 DIAGNOSIS — E871 Hypo-osmolality and hyponatremia: Secondary | ICD-10-CM

## 2019-01-09 ENCOUNTER — Ambulatory Visit: Payer: Medicare HMO

## 2019-01-09 ENCOUNTER — Ambulatory Visit: Admission: RE | Admit: 2019-01-09 | Payer: Medicare HMO | Source: Ambulatory Visit

## 2019-01-14 ENCOUNTER — Ambulatory Visit: Admission: RE | Admit: 2019-01-14 | Payer: Medicare HMO | Source: Ambulatory Visit

## 2019-01-14 ENCOUNTER — Ambulatory Visit: Payer: Medicare HMO

## 2019-01-27 ENCOUNTER — Ambulatory Visit
Admission: RE | Admit: 2019-01-27 | Discharge: 2019-01-27 | Disposition: A | Discharge status: Home / Self Care | Payer: Medicare HMO | Source: Ambulatory Visit | Attending: Nephrology | Admitting: Nephrology

## 2019-01-27 ENCOUNTER — Other Ambulatory Visit: Payer: Self-pay

## 2019-01-27 DIAGNOSIS — E871 Hypo-osmolality and hyponatremia: Secondary | ICD-10-CM

## 2019-01-27 DIAGNOSIS — N281 Cyst of kidney, acquired: Secondary | ICD-10-CM | POA: Insufficient documentation

## 2019-01-27 DIAGNOSIS — R918 Other nonspecific abnormal finding of lung field: Secondary | ICD-10-CM | POA: Insufficient documentation

## 2019-01-27 DIAGNOSIS — K8020 Calculus of gallbladder without cholecystitis without obstruction: Secondary | ICD-10-CM | POA: Diagnosis not present

## 2019-01-27 DIAGNOSIS — K449 Diaphragmatic hernia without obstruction or gangrene: Secondary | ICD-10-CM | POA: Diagnosis not present

## 2019-02-02 ENCOUNTER — Encounter: Payer: Self-pay | Admitting: Family Medicine

## 2019-02-13 ENCOUNTER — Ambulatory Visit (INDEPENDENT_AMBULATORY_CARE_PROVIDER_SITE_OTHER): Payer: Medicare HMO

## 2019-02-13 ENCOUNTER — Other Ambulatory Visit: Payer: Self-pay

## 2019-02-13 DIAGNOSIS — Z23 Encounter for immunization: Secondary | ICD-10-CM | POA: Diagnosis not present

## 2019-03-12 ENCOUNTER — Other Ambulatory Visit: Payer: Self-pay

## 2019-03-12 DIAGNOSIS — E039 Hypothyroidism, unspecified: Secondary | ICD-10-CM

## 2019-03-12 DIAGNOSIS — N184 Chronic kidney disease, stage 4 (severe): Secondary | ICD-10-CM

## 2019-03-12 DIAGNOSIS — I10 Essential (primary) hypertension: Secondary | ICD-10-CM

## 2019-03-12 MED ORDER — LEVOTHYROXINE SODIUM 50 MCG PO TABS: 50.0000 ug | ORAL_TABLET | Freq: Every day | ORAL | 3 refills | Status: DC

## 2019-03-12 MED ORDER — LISINOPRIL 10 MG PO TABS: 10.0000 mg | ORAL_TABLET | Freq: Every day | ORAL | 3 refills | Status: DC

## 2019-03-12 MED ORDER — AMLODIPINE BESYLATE 10 MG PO TABS: 10.0000 mg | ORAL_TABLET | Freq: Every day | ORAL | 3 refills | Status: DC

## 2019-03-12 MED ORDER — HYDRALAZINE HCL 25 MG PO TABS: 50.0000 mg | ORAL_TABLET | Freq: Every day | ORAL | 3 refills | Status: DC

## 2019-03-12 NOTE — Telephone Encounter (Signed)
Patient came into the office and would like to know if you will renew all of her prescriptions before you retire. She is due for a CPE in early 2021  [facility]

## 2019-03-31 ENCOUNTER — Other Ambulatory Visit: Payer: Self-pay | Admitting: Oncology

## 2019-03-31 ENCOUNTER — Telehealth: Payer: Self-pay | Admitting: *Deleted

## 2019-03-31 DIAGNOSIS — R911 Solitary pulmonary nodule: Secondary | ICD-10-CM

## 2019-03-31 NOTE — Telephone Encounter (Signed)
Referral received for pt to be seen in Lung Nodule Clinic for further workup of incidental lung nodule with follow up CT scan. Left message with patient to call back to discuss clinic and review recommendations and upcoming appts including follow up CT scan and visit with Jenny Burns, NP to discuss results. Awaiting call back.  

## 2019-03-31 NOTE — Progress Notes (Signed)
  Pulmonary Nodule Clinic Telephone Note  Received referral from Dr. Jeananne Rama.   Patient is followed by primary care Dr. Natale Milch and had recent CT chest without contrast on 01/27/2019 for history of lung nodules.  He was referred to the lung nodule program for monitoring with imaging and recommendations based on Fleischner's guidelines.  I personally reviewed all patient's previous imaging including most recent CT chest without contrast which revealed both lung nodules all measure less than 3 to 4 mm.  A noncontrasted chest CT is recommended in approximately 12 months.  I recommend follow-up with noncontrasted CT scan of the chest in approximately 9 months.   Patient is not a current everyday smoker.  High risk factors include: History of heavy smoking, exposure to asbestos, radium or uranium, personal family history of lung cancer, older age, sex (females greater than males), race (black and native [place] greater than weight), marginal speculation, upper lobe location, multiplicity (less than 5 nodules increases risk for malignancy) and emphysema and/or pulmonary fibrosis.   This recommendation follows the consensus statement: Guidelines for Management of Incidental Pulmonary Nodules Detected on CT Images: From the Fleischner Society 2017; Radiology 2017; 284:228-243.    I have placed order for CT scan without contrast to be completed approximately 12 months from previous CT scan.   I will touch base with patient and schedule him/her virtually for results of the CT scan and recommendations per Fleischner's guidelines and our pulmonary nodule clinic.  Faythe Casa, NP 03/31/2019 2:31 PM

## 2019-06-10 DIAGNOSIS — N2581 Secondary hyperparathyroidism of renal origin: Secondary | ICD-10-CM | POA: Insufficient documentation

## 2019-06-11 DIAGNOSIS — N184 Chronic kidney disease, stage 4 (severe): Secondary | ICD-10-CM | POA: Diagnosis not present

## 2019-06-11 DIAGNOSIS — I10 Essential (primary) hypertension: Secondary | ICD-10-CM | POA: Diagnosis not present

## 2019-06-11 DIAGNOSIS — N2581 Secondary hyperparathyroidism of renal origin: Secondary | ICD-10-CM | POA: Diagnosis not present

## 2019-06-18 DIAGNOSIS — E872 Acidosis: Secondary | ICD-10-CM | POA: Diagnosis not present

## 2019-06-18 DIAGNOSIS — E871 Hypo-osmolality and hyponatremia: Secondary | ICD-10-CM | POA: Diagnosis not present

## 2019-06-18 DIAGNOSIS — N184 Chronic kidney disease, stage 4 (severe): Secondary | ICD-10-CM | POA: Diagnosis not present

## 2019-06-18 DIAGNOSIS — R809 Proteinuria, unspecified: Secondary | ICD-10-CM | POA: Diagnosis not present

## 2019-06-18 DIAGNOSIS — I10 Essential (primary) hypertension: Secondary | ICD-10-CM | POA: Diagnosis not present

## 2019-06-18 DIAGNOSIS — N2581 Secondary hyperparathyroidism of renal origin: Secondary | ICD-10-CM | POA: Diagnosis not present

## 2019-07-06 ENCOUNTER — Encounter: Payer: Medicare HMO | Admitting: Family Medicine

## 2019-07-16 ENCOUNTER — Ambulatory Visit (INDEPENDENT_AMBULATORY_CARE_PROVIDER_SITE_OTHER): Payer: Medicare HMO

## 2019-07-16 DIAGNOSIS — Z0000 Encounter for general adult medical examination without abnormal findings: Secondary | ICD-10-CM | POA: Diagnosis not present

## 2019-07-16 NOTE — Progress Notes (Signed)
Subjective:   Karla Little is a 86 y.o. female who presents for Medicare Annual (Subsequent) preventive examination.  This visit is being conducted via phone call  - after an attmept to do on video chat - due to the COVID-19 pandemic. This patient has given me verbal consent via phone to conduct this visit, patient states they are participating from their home address. Some vital signs may be absent or patient reported.   Patient identification: identified by name, DOB, and current address.    Review of Systems:   Cardiac Risk Factors include: advanced age (>33men, >87 women);dyslipidemia;hypertension     Objective:     Vitals: There were no vitals taken for this visit.  There is no height or weight on file to calculate BMI.  Advanced Directives 07/16/2019 11/07/2018 09/12/2018 09/11/2018 07/15/2018 09/23/2016 09/13/2016  Does Patient Have a Medical Advance Directive? No Yes No No No No Yes  Type of Advance Directive - - - - - - Living will  Would patient like information on creating a medical advance directive? - - No - Patient declined No - Patient declined - No - Patient declined -    Tobacco Social History   Tobacco Use  Smoking Status Never Smoker  Smokeless Tobacco Never Used     Counseling given: Not Answered   Clinical Intake:  Pre-visit preparation completed: Yes  Pain : No/denies pain     Nutritional Risks: None Diabetes: No  How often do you need to have someone help you when you read instructions, pamphlets, or other written materials from your doctor or pharmacy?: 1 - Never  Interpreter Needed?: No  Information entered by :: Karla Trautner,LPN  Past Medical History:  Diagnosis Date  . Chronic kidney disease   . Hyperlipidemia   . Hypertension   . Hyponatremia   . Hypothyroidism   . 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 Osteoporosis   . Renal insufficiency   . Vertigo    Past Surgical History:  Procedure Laterality Date  . BREAST  BIOPSY    . COLONOSCOPY W/ POLYPECTOMY    . COLONOSCOPY WITH PROPOFOL N/A 06/02/2015   Procedure: COLONOSCOPY WITH PROPOFOL;  Surgeon: Karla Silvas, MD;  Location: [facility] ENDOSCOPY;  Service: Endoscopy;  Laterality: N/A;   Family History  Problem Relation Age of Onset  . Hypertension Mother   . Heart disease Father   . Breast cancer Sister    Social History   Socioeconomic History  . Marital status: Married    Spouse name: Not on file  . Number of children: Not on file  . Years of education: Not on file  . Highest education level: Not on file  Occupational History  . Not on file  Tobacco Use  . Smoking status: Never Smoker  . Smokeless tobacco: Never Used  Substance and Sexual Activity  . Alcohol use: No  . Drug use: No  . Sexual activity: Not on file  Other Topics Concern  . Not on file  Social History Narrative  . Not on file   Social Determinants of Health   Financial Resource Strain:   . Difficulty of Paying Living Expenses: Not on file  Food Insecurity:   . Worried About Charity fundraiser in the Last Year: Not on file  . Ran Out of Food in the Last Year: Not on file  Transportation Needs:   . Lack of Transportation (Medical): Not on file  . Lack of Transportation (  Non-Medical): Not on file  Physical Activity:   . Days of Exercise per Week: Not on file  . Minutes of Exercise per Session: Not on file  Stress:   . Feeling of Stress : Not on file  Social Connections:   . Frequency of Communication with Friends and Family: Not on file  . Frequency of Social Gatherings with Friends and Family: Not on file  . Attends Religious Services: Not on file  . Active Member of Clubs or Organizations: Not on file  . Attends Archivist Meetings: Not on file  . Marital Status: Not on file    Outpatient Encounter Medications as of 07/16/2019  Medication Sig  . albuterol (PROVENTIL HFA;VENTOLIN HFA) 108 (90 Base) MCG/ACT inhaler Inhale 2 puffs into the lungs  every 6 (six) hours as needed for wheezing or shortness of breath.  Marland Kitchen amLODipine (NORVASC) 10 MG tablet Take 1 tablet (10 mg total) by mouth daily.  . calcitRIOL (ROCALTROL) 0.25 MCG capsule Take 0.25 mcg by mouth daily.   . hydrALAZINE (APRESOLINE) 25 MG tablet Take 2 tablets (50 mg total) by mouth daily. (Patient taking differently: Take 50 mg by mouth daily. Taking 1 tablet a day)  . levothyroxine (SYNTHROID) 50 MCG tablet Take 1 tablet (50 mcg total) by mouth daily.  Marland Kitchen lisinopril (ZESTRIL) 10 MG tablet Take 1 tablet (10 mg total) by mouth daily.  . sodium bicarbonate 650 MG tablet Take 650 mg by mouth 2 (two) times daily.  . vitamin B-12 (CYANOCOBALAMIN) 250 MCG tablet Take 500 mcg by mouth daily.   . [DISCONTINUED] traMADol (ULTRAM) 50 MG tablet Take 1 tablet (50 mg total) by mouth every 12 (twelve) hours as needed for severe pain. (Patient not taking: Reported on 10/30/2018)   No facility-administered encounter medications on file as of 07/16/2019.    Activities of Daily Living In your present state of health, do you have any difficulty performing the following activities: 07/16/2019 11/07/2018  Hearing? Y N  Comment no hearing aids -  Vision? Y N  Comment eyeglasses, needs left eye cataract removed. Dr.Woodard -  Difficulty concentrating or making decisions? N N  Walking or climbing stairs? N N  Dressing or bathing? N N  Doing errands, shopping? N N  Preparing Food and eating ? N -  Using the Toilet? N -  In the past six months, have you accidently leaked urine? Y -  Comment pads -  Do you have problems with loss of bowel control? N -  Managing your Medications? N -  Managing your Finances? N -  Housekeeping or managing your Housekeeping? N -  Some recent data might be hidden    Patient Care Team: Karla Maple, MD as PCP - General (Family Medicine) Karla Ligas, MD as Referring Physician (Internal Medicine) Karla Silvas, MD (Inactive) (Gastroenterology)      Assessment:   This is a routine wellness examination for Karla Little.  Exercise Activities and Dietary recommendations Current Exercise Habits: The patient does not participate in regular exercise at present, Exercise limited by: None identified  Goals   None     Fall Risk: Fall Risk  07/16/2019 10/30/2018 07/02/2018 09/20/2017 06/24/2017  Falls in the past year? 0 0 0 No No  Number falls in past yr: 0 - 0 - -  Injury with Fall? 0 - 0 - -    FALL RISK PREVENTION PERTAINING TO THE HOME:  Any stairs in or around the home? Yes  If so, are  there any without handrails? No   Home free of loose throw rugs in walkways, pet beds, electrical cords, etc? Yes  Adequate lighting in your home to reduce risk of falls? Yes   ASSISTIVE DEVICES UTILIZED TO PREVENT FALLS:  Life alert? No  Use of a cane, walker or w/c? Yes cane  Grab bars in the bathroom? No  Shower chair or bench in shower? Yes  Elevated toilet seat or a handicapped toilet? No   DME ORDERS:  DME order needed?  No   TIMED UP AND GO:  Unable to perform    Depression Screen PHQ 2/9 Scores 07/16/2019 07/02/2018 06/24/2017 06/22/2016  PHQ - 2 Score 0 0 0 0  PHQ- 9 Score - 0 - -     Cognitive Function        Immunization History  Administered Date(s) Administered  . Fluad Quad(high Dose 65+) 02/13/2019  . Influenza, High Dose Seasonal PF 02/28/2016, 03/07/2017, 03/17/2018  . Influenza,inj,Quad PF,6+ Mos 03/16/2015  . Influenza-Unspecified 02/22/2014  . Pneumococcal Conjugate-13 12/30/2013  . Pneumococcal-Unspecified 02/17/2004, 05/03/2009  . Td 04/14/2008  . Zoster 04/22/2006    Qualifies for Shingles Vaccine? Yes  Zostavax completed n/a. Due for Shingrix. Education has been provided regarding the importance of this vaccine. Pt has been advised to call insurance company to determine out of pocket expense. Advised may also receive vaccine at local pharmacy or Health Dept. Verbalized acceptance and understanding.  Tdap:  Discussed need for TD/TDAP vaccine, patient verbalized understanding that this is not covered as a preventative with there insurance and to call the office if she develops any new skin injuries, ie: cuts, scrapes, bug bites, or open wounds.  Flu Vaccine: up to date   Pneumococcal Vaccine: up to date    Covid-19 Vaccine: scheduled   Screening Tests Health Maintenance  Topic Date Due  . TETANUS/TDAP  04/14/2018  . INFLUENZA VACCINE  Completed  . DEXA SCAN  Completed  . PNA vac Low Risk Adult  Completed    Cancer Screenings:  Colorectal Screening: no longer required   Mammogram: no longer required   Bone Density: no loner required   Lung Cancer Screening: up to date   Additional Screening:  Hepatitis C Screening: does not qualify   Vision Screening: Recommended annual ophthalmology exams for early detection of glaucoma and other disorders of the eye. Is the patient up to date with their annual eye exam?  Yes  Who is the provider or what is the name of the office in which the pt attends annual eye exams? Dr.Woodard   Dental Screening: Recommended annual dental exams for proper oral hygiene  Community Resource Referral:  CRR required this visit?  No       Plan:  I have personally reviewed and addressed the Medicare Annual Wellness questionnaire and have noted the following in the patient's chart:  A. Medical and social history B. Use of alcohol, tobacco or illicit drugs  C. Current medications and supplements D. Functional ability and status E.  Nutritional status F.  Physical activity G. Advance directives H. List of other physicians I.  Hospitalizations, surgeries, and ER visits in previous 12 months J.  [place] such as hearing and vision if needed, cognitive and depression L. Referrals and appointments   In addition, I have reviewed and discussed with patient certain preventive protocols, quality metrics, and best practice recommendations. A  written personalized care plan for preventive services as well as general preventive health recommendations were provided  to patient.  Signed,    Bevelyn Ngo, LPN  7/74/1423 Nurse Health Advisor   Nurse Notes: none

## 2019-07-16 NOTE — Patient Instructions (Signed)
Karla Little , Thank you for taking time to come for your Medicare Wellness Visit. I appreciate your ongoing commitment to your health goals. Please review the following plan we discussed and let me know if I can assist you in the future.   Screening recommendations/referrals: Colonoscopy: no longer required  Mammogram: no longer required  Bone Density: no longer required  Recommended yearly ophthalmology/optometry visit for glaucoma screening and checkup Recommended yearly dental visit for hygiene and checkup  Vaccinations: Influenza vaccine: up to date  Pneumococcal vaccine: up to date  Tdap vaccine: due now  Shingles vaccine: shingrix eligible     Advanced directives: Please bring a copy of your health care power of attorney and living will to the office at your convenience.  Conditions/risks identified: none   Next appointment: Follow up in one year for your annual wellness visit    Preventive Care 65 Years and Older, Female Preventive care refers to lifestyle choices and visits with your health care provider that can promote health and wellness. What does preventive care include?  A yearly physical exam. This is also called an annual well check.  Dental exams once or twice a year.  Routine eye exams. Ask your health care provider how often you should have your eyes checked.  Personal lifestyle choices, including:  Daily care of your teeth and gums.  Regular physical activity.  Eating a healthy diet.  Avoiding tobacco and drug use.  Limiting alcohol use.  Practicing safe sex.  Taking low-dose aspirin every day.  Taking vitamin and mineral supplements as recommended by your health care provider. What happens during an annual well check? The services and screenings done by your health care provider during your annual well check will depend on your age, overall health, lifestyle risk factors, and family history of disease. Counseling  Your health care provider may  ask you questions about your:  Alcohol use.  Tobacco use.  Drug use.  Emotional well-being.  Home and relationship well-being.  Sexual activity.  Eating habits.  History of falls.  Memory and ability to understand (cognition).  Work and work Statistician.  Reproductive health. Screening  You may have the following tests or measurements:  Height, weight, and BMI.  Blood pressure.  Lipid and cholesterol levels. These may be checked every 5 years, or more frequently if you are over 28 years old.  Skin check.  Lung cancer screening. You may have this screening every year starting at age 32 if you have a 30-pack-year history of smoking and currently smoke or have quit within the past 15 years.  Fecal occult blood test (FOBT) of the stool. You may have this test every year starting at age 46.  Flexible sigmoidoscopy or colonoscopy. You may have a sigmoidoscopy every 5 years or a colonoscopy every 10 years starting at age 69.  Hepatitis C blood test.  Hepatitis B blood test.  Sexually transmitted disease (STD) testing.  Diabetes screening. This is done by checking your blood sugar (glucose) after you have not eaten for a while (fasting). You may have this done every 1-3 years.  Bone density scan. This is done to screen for osteoporosis. You may have this done starting at age 17.  Mammogram. This may be done every 1-2 years. Talk to your health care provider about how often you should have regular mammograms. Talk with your health care provider about your test results, treatment options, and if necessary, the need for more tests. Vaccines  Your health care provider  may recommend certain vaccines, such as:  Influenza vaccine. This is recommended every year.  Tetanus, diphtheria, and acellular pertussis (Tdap, Td) vaccine. You may need a Td booster every 10 years.  Zoster vaccine. You may need this after age 80.  Pneumococcal 13-valent conjugate (PCV13) vaccine. One  dose is recommended after age 68.  Pneumococcal polysaccharide (PPSV23) vaccine. One dose is recommended after age 48. Talk to your health care provider about which screenings and vaccines you need and how often you need them. This information is not intended to replace advice given to you by your health care provider. Make sure you discuss any questions you have with your health care provider. Document Released: 06/17/2015 Document Revised: 02/08/2016 Document Reviewed: 03/22/2015 Elsevier Interactive Patient Education  2017 [place] Prevention in the Home Falls can cause injuries. They can happen to people of all ages. There are many things you can do to make your home safe and to help prevent falls. What can I do on the outside of my home?  Regularly fix the edges of walkways and driveways and fix any cracks.  Remove anything that might make you trip as you walk through a door, such as a raised step or threshold.  Trim any bushes or trees on the path to your home.  Use bright outdoor lighting.  Clear any walking paths of anything that might make someone trip, such as rocks or tools.  Regularly check to see if handrails are loose or broken. Make sure that both sides of any steps have handrails.  Any raised decks and porches should have guardrails on the edges.  Have any leaves, snow, or ice cleared regularly.  Use sand or salt on walking paths during winter.  Clean up any spills in your garage right away. This includes oil or grease spills. What can I do in the bathroom?  Use night lights.  Install grab bars by the toilet and in the tub and shower. Do not use towel bars as grab bars.  Use non-skid mats or decals in the tub or shower.  If you need to sit down in the shower, use a plastic, non-slip stool.  Keep the floor dry. Clean up any water that spills on the floor as soon as it happens.  Remove soap buildup in the tub or shower regularly.  Attach bath  mats securely with double-sided non-slip rug tape.  Do not have throw rugs and other things on the floor that can make you trip. What can I do in the bedroom?  Use night lights.  Make sure that you have a light by your bed that is easy to reach.  Do not use any sheets or blankets that are too big for your bed. They should not hang down onto the floor.  Have a firm chair that has side arms. You can use this for support while you get dressed.  Do not have throw rugs and other things on the floor that can make you trip. What can I do in the kitchen?  Clean up any spills right away.  Avoid walking on wet floors.  Keep items that you use a lot in easy-to-reach places.  If you need to reach something above you, use a strong step stool that has a grab bar.  Keep electrical cords out of the way.  Do not use floor polish or wax that makes floors slippery. If you must use wax, use non-skid floor wax.  Do not have throw rugs  and other things on the floor that can make you trip. What can I do with my stairs?  Do not leave any items on the stairs.  Make sure that there are handrails on both sides of the stairs and use them. Fix handrails that are broken or loose. Make sure that handrails are as long as the stairways.  Check any carpeting to make sure that it is firmly attached to the stairs. Fix any carpet that is loose or worn.  Avoid having throw rugs at the top or bottom of the stairs. If you do have throw rugs, attach them to the floor with carpet tape.  Make sure that you have a light switch at the top of the stairs and the bottom of the stairs. If you do not have them, ask someone to add them for you. What else can I do to help prevent falls?  Wear shoes that:  Do not have high heels.  Have rubber bottoms.  Are comfortable and fit you well.  Are closed at the toe. Do not wear sandals.  If you use a stepladder:  Make sure that it is fully opened. Do not climb a closed  stepladder.  Make sure that both sides of the stepladder are locked into place.  Ask someone to hold it for you, if possible.  Clearly mark and make sure that you can see:  Any grab bars or handrails.  First and last steps.  Where the edge of each step is.  Use tools that help you move around (mobility aids) if they are needed. These include:  Canes.  Walkers.  Scooters.  Crutches.  Turn on the lights when you go into a dark area. Replace any light bulbs as soon as they burn out.  Set up your furniture so you have a clear path. Avoid moving your furniture around.  If any of your floors are uneven, fix them.  If there are any pets around you, be aware of where they are.  Review your medicines with your doctor. Some medicines can make you feel dizzy. This can increase your chance of falling. Ask your doctor what other things that you can do to help prevent falls. This information is not intended to replace advice given to you by your health care provider. Make sure you discuss any questions you have with your health care provider. Document Released: 03/17/2009 Document Revised: 10/27/2015 Document Reviewed: 06/25/2014 Elsevier Interactive Patient Education  2017 [employer].

## 2019-07-28 ENCOUNTER — Ambulatory Visit: Payer: Medicare HMO | Admitting: Unknown Physician Specialty

## 2019-08-18 DIAGNOSIS — R69 Illness, unspecified: Secondary | ICD-10-CM | POA: Diagnosis not present

## 2019-08-19 ENCOUNTER — Encounter: Payer: Self-pay | Admitting: Family Medicine

## 2019-08-19 ENCOUNTER — Telehealth: Payer: Self-pay | Admitting: Family Medicine

## 2019-08-19 NOTE — Telephone Encounter (Signed)
erroneous error  

## 2019-08-20 DIAGNOSIS — R69 Illness, unspecified: Secondary | ICD-10-CM | POA: Diagnosis not present

## 2019-08-25 ENCOUNTER — Encounter: Payer: Medicare HMO | Admitting: Nurse Practitioner

## 2019-10-21 ENCOUNTER — Telehealth: Payer: Self-pay | Admitting: *Deleted

## 2019-10-21 NOTE — Telephone Encounter (Signed)
Spoke with patient regarding referral to the lung nodule clinic by Dr. Jeananne Rama. Informed that it is recommended to follow up on lung nodule 12 months from last CT scan. Pt due for follow up scan in Aug 2021. Pt states that she would like to discuss with Dr. Holley Raring before proceeding with scan. Informed pt that I will give her a call back towards the end of June to follow up. Pt verbalized understanding. Nothing further needed at this time.

## 2019-11-16 DIAGNOSIS — N184 Chronic kidney disease, stage 4 (severe): Secondary | ICD-10-CM | POA: Diagnosis not present

## 2019-11-20 DIAGNOSIS — E872 Acidosis: Secondary | ICD-10-CM | POA: Diagnosis not present

## 2019-11-20 DIAGNOSIS — E871 Hypo-osmolality and hyponatremia: Secondary | ICD-10-CM | POA: Diagnosis not present

## 2019-11-20 DIAGNOSIS — N184 Chronic kidney disease, stage 4 (severe): Secondary | ICD-10-CM | POA: Diagnosis not present

## 2019-11-20 DIAGNOSIS — I10 Essential (primary) hypertension: Secondary | ICD-10-CM | POA: Diagnosis not present

## 2019-11-20 DIAGNOSIS — N2581 Secondary hyperparathyroidism of renal origin: Secondary | ICD-10-CM | POA: Diagnosis not present

## 2019-12-22 ENCOUNTER — Telehealth: Payer: Self-pay | Admitting: *Deleted

## 2019-12-22 NOTE — Telephone Encounter (Signed)
Spoke with patient to see if she was agreeable to follow up CT scan and follow up visit in the lung nodule clinic to follow up on lung nodules seen on prior imaging. Pt declined appts and stated will call back if she changes her mind. Appts cancelled at this time. PCP made aware.

## 2020-01-06 ENCOUNTER — Telehealth: Payer: Self-pay | Admitting: Nurse Practitioner

## 2020-01-06 ENCOUNTER — Other Ambulatory Visit: Payer: Self-pay | Admitting: Nurse Practitioner

## 2020-01-06 DIAGNOSIS — I10 Essential (primary) hypertension: Secondary | ICD-10-CM

## 2020-01-06 MED ORDER — HYDRALAZINE HCL 25 MG PO TABS: 50.0000 mg | ORAL_TABLET | Freq: Every day | ORAL | 4 refills | Status: DC

## 2020-01-06 NOTE — Telephone Encounter (Signed)
Script sent  

## 2020-01-06 NOTE — Telephone Encounter (Signed)
Pt presented to office requesting hydrALAZINE HCl (Tab) sent to [facility]. She stated that she has 2 pills left for tomorrow and pt is scheduled for an appt 01/08/20 for her cpe.

## 2020-01-08 ENCOUNTER — Ambulatory Visit (INDEPENDENT_AMBULATORY_CARE_PROVIDER_SITE_OTHER): Payer: Medicare HMO | Admitting: Nurse Practitioner

## 2020-01-08 ENCOUNTER — Encounter: Payer: Self-pay | Admitting: Nurse Practitioner

## 2020-01-08 ENCOUNTER — Other Ambulatory Visit: Payer: Self-pay

## 2020-01-08 VITALS — BP 135/69 | HR 67 | Temp 98.5°F | Ht 67.2 in | Wt 168.8 lb

## 2020-01-08 DIAGNOSIS — E782 Mixed hyperlipidemia: Secondary | ICD-10-CM | POA: Diagnosis not present

## 2020-01-08 DIAGNOSIS — N184 Chronic kidney disease, stage 4 (severe): Secondary | ICD-10-CM | POA: Diagnosis not present

## 2020-01-08 DIAGNOSIS — I10 Essential (primary) hypertension: Secondary | ICD-10-CM

## 2020-01-08 DIAGNOSIS — Z0000 Encounter for general adult medical examination without abnormal findings: Secondary | ICD-10-CM

## 2020-01-08 DIAGNOSIS — E039 Hypothyroidism, unspecified: Secondary | ICD-10-CM

## 2020-01-08 DIAGNOSIS — N2581 Secondary hyperparathyroidism of renal origin: Secondary | ICD-10-CM

## 2020-01-08 MED ORDER — AMLODIPINE BESYLATE 10 MG PO TABS: 10.0000 mg | ORAL_TABLET | Freq: Every day | ORAL | 4 refills | Status: DC

## 2020-01-08 MED ORDER — LEVOTHYROXINE SODIUM 50 MCG PO TABS: 50.0000 ug | ORAL_TABLET | Freq: Every day | ORAL | 4 refills | Status: DC

## 2020-01-08 MED ORDER — LISINOPRIL 10 MG PO TABS: 10.0000 mg | ORAL_TABLET | Freq: Every day | ORAL | 4 refills | Status: DC

## 2020-01-08 NOTE — Assessment & Plan Note (Signed)
Chronic, stable with BP at goal for age in office today. Continue current medication regimen and adjust as needed.  Recommend she monitor BP at home at least 3 days a week and document + focus on DASH diet.  Recent labs obtained by nephrology.  Refills sent in.  Return in 6 months.

## 2020-01-08 NOTE — Assessment & Plan Note (Signed)
Chronic, ongoing.  Continue current medication regimen and adjust as needed.  Obtain TSH and Free T4 today.  Refills sent in.  Return in 6 months.

## 2020-01-08 NOTE — Assessment & Plan Note (Signed)
Chronic, ongoing, followed by nephrology.  Continue this collaboration and medication regimen as recommended by them.  Recent labs obtained by nephrology in June.

## 2020-01-08 NOTE — Assessment & Plan Note (Signed)
Chronic, ongoing.  Followed by nephrology, continue current medication regimen as recommended by them.  Recent labs obtained by nephrology in June.

## 2020-01-08 NOTE — Assessment & Plan Note (Addendum)
Chronic, ongoing.  Statin discontinued a few years back due to myalgias and fatigue, which improved without statin.  She does not wish to restart.  Check lipid panel today, non fasting.  Focus on diet.

## 2020-01-08 NOTE — Patient Instructions (Signed)
DASH Eating Plan DASH stands for "Dietary Approaches to Stop Hypertension." The DASH eating plan is a healthy eating plan that has been shown to reduce high blood pressure (hypertension). It may also reduce your risk for type 2 diabetes, heart disease, and stroke. The DASH eating plan may also help with weight loss. What are tips for following this plan?  General guidelines  Avoid eating more than 2,300 mg (milligrams) of salt (sodium) a day. If you have hypertension, you may need to reduce your sodium intake to 1,500 mg a day.  Limit alcohol intake to no more than 1 drink a day for nonpregnant women and 2 drinks a day for men. One drink equals 12 oz of beer, 5 oz of wine, or 1 oz of hard liquor.  Work with your health care provider to maintain a healthy body weight or to lose weight. Ask what an ideal weight is for you.  Get at least 30 minutes of exercise that causes your heart to beat faster (aerobic exercise) most days of the week. Activities may include walking, swimming, or biking.  Work with your health care provider or diet and nutrition specialist (dietitian) to adjust your eating plan to your individual calorie needs. Reading food labels   Check food labels for the amount of sodium per serving. Choose foods with less than 5 percent of the Daily Value of sodium. Generally, foods with less than 300 mg of sodium per serving fit into this eating plan.  To find whole grains, look for the word "whole" as the first word in the ingredient list. Shopping  Buy products labeled as "low-sodium" or "no salt added."  Buy fresh foods. Avoid canned foods and premade or frozen meals. Cooking  Avoid adding salt when cooking. Use salt-free seasonings or herbs instead of table salt or sea salt. Check with your health care provider or pharmacist before using salt substitutes.  Do not fry foods. Cook foods using healthy methods such as baking, boiling, grilling, and broiling instead.  Cook with  heart-healthy oils, such as olive, canola, soybean, or sunflower oil. Meal planning  Eat a balanced diet that includes: ? 5 or more servings of fruits and vegetables each day. At each meal, try to fill half of your plate with fruits and vegetables. ? Up to 6-8 servings of whole grains each day. ? Less than 6 oz of lean meat, poultry, or fish each day. A 3-oz serving of meat is about the same size as a deck of cards. One egg equals 1 oz. ? 2 servings of low-fat dairy each day. ? A serving of nuts, seeds, or beans 5 times each week. ? Heart-healthy fats. Healthy fats called Omega-3 fatty acids are found in foods such as flaxseeds and coldwater fish, like sardines, salmon, and mackerel.  Limit how much you eat of the following: ? Canned or prepackaged foods. ? Food that is high in trans fat, such as fried foods. ? Food that is high in saturated fat, such as fatty meat. ? Sweets, desserts, sugary drinks, and other foods with added sugar. ? Full-fat dairy products.  Do not salt foods before eating.  Try to eat at least 2 vegetarian meals each week.  Eat more home-cooked food and less restaurant, buffet, and fast food.  When eating at a restaurant, ask that your food be prepared with less salt or no salt, if possible. What foods are recommended? The items listed may not be a complete list. Talk with your dietitian about   what dietary choices are best for you. Grains Whole-grain or whole-wheat bread. Whole-grain or whole-wheat pasta. Brown rice. Oatmeal. Quinoa. Bulgur. Whole-grain and low-sodium cereals. Pita bread. Low-fat, low-sodium crackers. Whole-wheat flour tortillas. Vegetables Fresh or frozen vegetables (raw, steamed, roasted, or grilled). Low-sodium or reduced-sodium tomato and vegetable juice. Low-sodium or reduced-sodium tomato sauce and tomato paste. Low-sodium or reduced-sodium canned vegetables. Fruits All fresh, dried, or frozen fruit. Canned fruit in natural juice (without  added sugar). Meat and other protein foods Skinless chicken or turkey. Ground chicken or turkey. Pork with fat trimmed off. Fish and seafood. Egg whites. Dried beans, peas, or lentils. Unsalted nuts, nut butters, and seeds. Unsalted canned beans. Lean cuts of beef with fat trimmed off. Low-sodium, lean deli meat. Dairy Low-fat (1%) or fat-free (skim) milk. Fat-free, low-fat, or reduced-fat cheeses. Nonfat, low-sodium ricotta or cottage cheese. Low-fat or nonfat yogurt. Low-fat, low-sodium cheese. Fats and oils Soft margarine without trans fats. Vegetable oil. Low-fat, reduced-fat, or light mayonnaise and salad dressings (reduced-sodium). Canola, safflower, olive, soybean, and sunflower oils. Avocado. Seasoning and other foods Herbs. Spices. Seasoning mixes without salt. Unsalted popcorn and pretzels. Fat-free sweets. What foods are not recommended? The items listed may not be a complete list. Talk with your dietitian about what dietary choices are best for you. Grains Baked goods made with fat, such as croissants, muffins, or some breads. Dry pasta or rice meal packs. Vegetables Creamed or fried vegetables. Vegetables in a cheese sauce. Regular canned vegetables (not low-sodium or reduced-sodium). Regular canned tomato sauce and paste (not low-sodium or reduced-sodium). Regular tomato and vegetable juice (not low-sodium or reduced-sodium). Pickles. Olives. Fruits Canned fruit in a light or heavy syrup. Fried fruit. Fruit in cream or butter sauce. Meat and other protein foods Fatty cuts of meat. Ribs. Fried meat. Bacon. Sausage. Bologna and other processed lunch meats. Salami. Fatback. Hotdogs. Bratwurst. Salted nuts and seeds. Canned beans with added salt. Canned or smoked fish. Whole eggs or egg yolks. Chicken or turkey with skin. Dairy Whole or 2% milk, cream, and half-and-half. Whole or full-fat cream cheese. Whole-fat or sweetened yogurt. Full-fat cheese. Nondairy creamers. Whipped toppings.  Processed cheese and cheese spreads. Fats and oils Butter. Stick margarine. Lard. Shortening. Ghee. Bacon fat. Tropical oils, such as coconut, palm kernel, or palm oil. Seasoning and other foods Salted popcorn and pretzels. Onion salt, garlic salt, seasoned salt, table salt, and sea salt. Worcestershire sauce. Tartar sauce. Barbecue sauce. Teriyaki sauce. Soy sauce, including reduced-sodium. Steak sauce. Canned and packaged gravies. Fish sauce. Oyster sauce. Cocktail sauce. Horseradish that you find on the shelf. Ketchup. Mustard. Meat flavorings and tenderizers. Bouillon cubes. Hot sauce and Tabasco sauce. Premade or packaged marinades. Premade or packaged taco seasonings. Relishes. Regular salad dressings. Where to find more information:  National Heart, Lung, and Blood Institute: www.nhlbi.nih.gov  American Heart Association: www.heart.org Summary  The DASH eating plan is a healthy eating plan that has been shown to reduce high blood pressure (hypertension). It may also reduce your risk for type 2 diabetes, heart disease, and stroke.  With the DASH eating plan, you should limit salt (sodium) intake to 2,300 mg a day. If you have hypertension, you may need to reduce your sodium intake to 1,500 mg a day.  When on the DASH eating plan, aim to eat more fresh fruits and vegetables, whole grains, lean proteins, low-fat dairy, and heart-healthy fats.  Work with your health care provider or diet and nutrition specialist (dietitian) to adjust your eating plan to your   individual calorie needs. This information is not intended to replace advice given to you by your health care provider. Make sure you discuss any questions you have with your health care provider. Document Revised: 05/03/2017 Document Reviewed: 05/14/2016 Elsevier Patient Education  2020 Elsevier Inc.  

## 2020-01-08 NOTE — Progress Notes (Signed)
BP 135/69   Pulse 67   Temp 98.5 F (36.9 C) (Oral)   Ht 5' 7.2" (1.707 m)   Wt 168 lb 12.8 oz (76.6 kg)   SpO2 98%   BMI 26.28 kg/m    Subjective:    Patient ID: Karla Little, female    DOB: 06/21/1933, 86 y.o.   MRN: [id number]  HPI: Karla Little is a 86 y.o. female presenting on 01/08/2020 for comprehensive medical examination. Current medical complaints include:none  She currently lives with: self Menopausal Symptoms: no   HYPERTENSION / HYPERLIPIDEMIA Continues on Lisinopril, Amlodipine, Hydralazine.  No current statin, her previous PCP took her off of this a few years back due to myalgias -- these improved with discontinuation. Satisfied with current treatment? yes Duration of hypertension: chronic BP monitoring frequency: not checking BP range:  BP medication side effects: no Duration of hyperlipidemia: chronic Cholesterol medication side effects: no Cholesterol supplements: none Medication compliance: good compliance Aspirin: no Recent stressors: no Recurrent headaches: no Visual changes: no Palpitations: no Dyspnea: occasional, at baseline Chest pain: no Lower extremity edema: a little bit in ankles at times Dizzy/lightheaded: no   CHRONIC KIDNEY DISEASE Continues to be followed by nephrology with last visit 11/20/19.   Reports having been followed by them for 10 years now. Taking Calcitriol, Sodium Bicarb, and Lisinopril.  Labs on 11/20/19 -- CRT 1.62, GFR 29, K+ 5.1, PTH 56.   CKD status: stable Medications renally dose: yes Previous renal evaluation: yes Pneumovax:  Up to Date Influenza Vaccine:  Up to Date   HYPOTHYROIDISM Continues on Levothyroxine 50 MCG daily with last TSH January 2019 -- 3.750. Thyroid control status:stable Satisfied with current treatment? yes Medication side effects: no Medication compliance: good compliance Etiology of hypothyroidism:  Recent dose adjustment:no Fatigue: no Cold intolerance: no Heat intolerance:  no Weight gain: no Weight loss: no Constipation: no Diarrhea/loose stools: no Palpitations: no Lower extremity edema: no Anxiety/depressed mood: no  Depression Screen done today and results listed below:  Depression screen [facility] 2/9 01/08/2020 07/16/2019 07/02/2018 06/24/2017 06/22/2016  Decreased Interest 0 0 0 0 0  Down, Depressed, Hopeless 0 0 0 0 0  PHQ - 2 Score 0 0 0 0 0  Altered sleeping - - 0 - -  Tired, decreased energy - - 0 - -  Change in appetite - - 0 - -  Feeling bad or failure about yourself  - - 0 - -  Trouble concentrating - - 0 - -  Moving slowly or fidgety/restless - - 0 - -  Suicidal thoughts - - 0 - -  PHQ-9 Score - - 0 - -    The patient does not have a history of falls. I did not complete a risk assessment for falls. A plan of care for falls was not documented.   Past Medical History:  Past Medical History:  Diagnosis Date  . Chronic kidney disease   . Hyperlipidemia   . Hypertension   . Hyponatremia   . Hypothyroidism   . 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 Osteoporosis   . Renal insufficiency   . Vertigo     Surgical History:  Past Surgical History:  Procedure Laterality Date  . BREAST BIOPSY    . COLONOSCOPY W/ POLYPECTOMY    . COLONOSCOPY WITH PROPOFOL N/A 06/02/2015   Procedure: COLONOSCOPY WITH PROPOFOL;  Surgeon: Manya Silvas, MD;  Location: [facility] ENDOSCOPY;  Service: Endoscopy;  Laterality: N/A;  Medications:  Current Outpatient Medications on File Prior to Visit  Medication Sig  . albuterol (PROVENTIL HFA;VENTOLIN HFA) 108 (90 Base) MCG/ACT inhaler Inhale 2 puffs into the lungs every 6 (six) hours as needed for wheezing or shortness of breath.  . calcitRIOL (ROCALTROL) 0.25 MCG capsule Take 0.25 mcg by mouth daily.   . hydrALAZINE (APRESOLINE) 25 MG tablet Take 2 tablets (50 mg total) by mouth daily. (Patient taking differently: Take 25 mg by mouth daily. )  . sodium bicarbonate 650 MG tablet Take 650 mg by mouth 2 (two)  times daily.  . vitamin B-12 (CYANOCOBALAMIN) 250 MCG tablet Take 500 mcg by mouth daily.    No current facility-administered medications on file prior to visit.    Allergies:  Allergies  Allergen Reactions  . Amoxicillin Rash    Did it involve swelling of the face/tongue/throat, SOB, or low BP? Yes Did it involve sudden or severe rash/hives, skin peeling, or any reaction on the inside of your mouth or nose? No Did you need to seek medical attention at a hospital or doctor's office? No When did it last happen?07/2018 If all above answers are "NO", may proceed with cephalosporin use.   . Sod Citrate-Citric Acid Other (See Comments)    Social History:  Social History   Socioeconomic History  . Marital status: Married    Spouse name: Not on file  . Number of children: Not on file  . Years of education: Not on file  . Highest education level: Not on file  Occupational History  . Not on file  Tobacco Use  . Smoking status: Never Smoker  . Smokeless tobacco: Never Used  Vaping Use  . Vaping Use: Never used  Substance and Sexual Activity  . Alcohol use: No  . Drug use: No  . Sexual activity: Not on file  Other Topics Concern  . Not on file  Social History Narrative  . Not on file   Social Determinants of Health   Financial Resource Strain:   . Difficulty of Paying Living Expenses:   Food Insecurity:   . Worried About Charity fundraiser in the Last Year:   . Arboriculturist in the Last Year:   Transportation Needs:   . Film/video editor (Medical):   Marland Kitchen Lack of Transportation (Non-Medical):   Physical Activity:   . Days of Exercise per Week:   . Minutes of Exercise per Session:   Stress:   . Feeling of Stress :   Social Connections:   . Frequency of Communication with Friends and Family:   . Frequency of Social Gatherings with Friends and Family:   . Attends Religious Services:   . Active Member of Clubs or Organizations:   . Attends Theatre manager Meetings:   Marland Kitchen Marital Status:   Intimate Partner Violence:   . Fear of Current or Ex-Partner:   . Emotionally Abused:   Marland Kitchen Physically Abused:   . Sexually Abused:    Social History   Tobacco Use  Smoking Status Never Smoker  Smokeless Tobacco Never Used   Social History   Substance and Sexual Activity  Alcohol Use No    Family History:  Family History  Problem Relation Age of Onset  . Hypertension Mother   . Heart disease Father   . Breast cancer Sister     Past medical history, surgical history, medications, allergies, family history and social history reviewed with patient today and changes made to appropriate areas  of the chart.   Review of Systems - negative All other ROS negative except what is listed above and in the HPI.      Objective:    BP 135/69   Pulse 67   Temp 98.5 F (36.9 C) (Oral)   Ht 5' 7.2" (1.707 m)   Wt 168 lb 12.8 oz (76.6 kg)   SpO2 98%   BMI 26.28 kg/m   Wt Readings from Last 3 Encounters:  01/08/20 168 lb 12.8 oz (76.6 kg)  11/07/18 162 lb 8 oz (73.7 kg)  10/30/18 166 lb 3.2 oz (75.4 kg)    Physical Exam Constitutional:      General: She is awake. She is not in acute distress.    Appearance: She is well-developed. She is not ill-appearing.  HENT:     Head: Normocephalic and atraumatic.     Right Ear: Hearing, tympanic membrane, ear canal and external ear normal. No drainage.     Left Ear: Hearing, tympanic membrane, ear canal and external ear normal. No drainage.     Nose: Nose normal.     Right Sinus: No maxillary sinus tenderness or frontal sinus tenderness.     Left Sinus: No maxillary sinus tenderness or frontal sinus tenderness.     Mouth/Throat:     Mouth: Mucous membranes are moist.     Pharynx: Oropharynx is clear. Uvula midline. No pharyngeal swelling, oropharyngeal exudate or posterior oropharyngeal erythema.  Eyes:     General: Lids are normal.        Right eye: No discharge.        Left eye: No  discharge.     Extraocular Movements: Extraocular movements intact.     Conjunctiva/sclera: Conjunctivae normal.     Pupils: Pupils are equal, round, and reactive to light.     Visual Fields: Right eye visual fields normal and left eye visual fields normal.  Neck:     Thyroid: No thyromegaly.     Vascular: No carotid bruit.     Trachea: Trachea normal.  Cardiovascular:     Rate and Rhythm: Normal rate and regular rhythm.     Heart sounds: Normal heart sounds. No murmur heard.  No gallop.   Pulmonary:     Effort: Pulmonary effort is normal. No accessory muscle usage or respiratory distress.     Breath sounds: Normal breath sounds.  Chest:     Comments: Deferred per patient request Abdominal:     General: Bowel sounds are normal.     Palpations: Abdomen is soft. There is no hepatomegaly or splenomegaly.     Tenderness: There is no abdominal tenderness.  Musculoskeletal:        General: Normal range of motion.     Cervical back: Normal range of motion and neck supple.     Right lower leg: No edema.     Left lower leg: No edema.  Lymphadenopathy:     Head:     Right side of head: No submental, submandibular, tonsillar, preauricular or posterior auricular adenopathy.     Left side of head: No submental, submandibular, tonsillar, preauricular or posterior auricular adenopathy.     Cervical: No cervical adenopathy.  Skin:    General: Skin is warm and dry.     Capillary Refill: Capillary refill takes less than 2 seconds.     Findings: No rash.  Neurological:     Mental Status: She is alert and oriented to person, place, and time.     Cranial  Nerves: Cranial nerves are intact.     Gait: Gait is intact.     Deep Tendon Reflexes: Reflexes are normal and symmetric.     Reflex Scores:      Brachioradialis reflexes are 2+ on the right side and 2+ on the left side.      Patellar reflexes are 2+ on the right side and 2+ on the left side. Psychiatric:        Attention and Perception:  Attention normal.        Mood and Affect: Mood normal.        Speech: Speech normal.        Behavior: Behavior normal. Behavior is cooperative.        Thought Content: Thought content normal.        Judgment: Judgment normal.    Results for orders placed or performed during the hospital encounter of 11/10/18  SARS Coronavirus 2 (CEPHEID - Performed in [place] hospital lab), [facility] Order   Specimen: Nasopharyngeal Swab  Result Value Ref Range   SARS Coronavirus 2 NEGATIVE NEGATIVE      Assessment & Plan:   Problem List Items Addressed This Visit      Cardiovascular and Mediastinum   Hypertension    Chronic, stable with BP at goal for age in office today. Continue current medication regimen and adjust as needed.  Recommend she monitor BP at home at least 3 days a week and document + focus on DASH diet.  Recent labs obtained by nephrology.  Refills sent in.  Return in 6 months.      Relevant Medications   lisinopril (ZESTRIL) 10 MG tablet   amLODipine (NORVASC) 10 MG tablet     Endocrine   Hypothyroidism    Chronic, ongoing.  Continue current medication regimen and adjust as needed.  Obtain TSH and Free T4 today.  Refills sent in.  Return in 6 months.      Relevant Medications   levothyroxine (SYNTHROID) 50 MCG tablet   Other Relevant Orders   TSH   T4, free   Secondary hyperparathyroidism of renal origin (HCC)    Chronic, ongoing.  Followed by nephrology, continue current medication regimen as recommended by them.  Recent labs obtained by nephrology in June.        Genitourinary   Chronic kidney disease, stage 4, severely decreased GFR (HCC)    Chronic, ongoing, followed by nephrology.  Continue this collaboration and medication regimen as recommended by them.  Recent labs obtained by nephrology in June.      Relevant Medications   lisinopril (ZESTRIL) 10 MG tablet   amLODipine (NORVASC) 10 MG tablet     Other   Hyperlipidemia    Chronic, ongoing.  Statin  discontinued a few years back due to myalgias and fatigue, which improved without statin.  She does not wish to restart.  Check lipid panel today, non fasting.  Focus on diet.      Relevant Medications   lisinopril (ZESTRIL) 10 MG tablet   amLODipine (NORVASC) 10 MG tablet   Other Relevant Orders   Lipid Panel w/o Chol/HDL Ratio    Other Visit Diagnoses    Routine general medical examination at a health care facility    -  Primary       Follow up plan: Return in about 6 months (around 07/10/2020) for HTN/HLD, CKD.   LABORATORY TESTING:  - Pap smear: not applicable  IMMUNIZATIONS:   - Tdap: Tetanus vaccination status reviewed: will  obtain next visit. - Influenza: Up to date - Pneumovax: Up to date - Prevnar: Up to date - HPV: Not applicable - Zostavax vaccine: Up to date  SCREENING: -Mammogram: Not applicable  - Colonoscopy: Not applicable  - Bone Density: Up to date  -Hearing Test: Not applicable  -Spirometry: Not applicable   PATIENT COUNSELING:   Advised to take 1 mg of folate supplement per day if capable of pregnancy.   Sexuality: Discussed sexually transmitted diseases, partner selection, use of condoms, avoidance of unintended pregnancy  and contraceptive alternatives.   Advised to avoid cigarette smoking.  I discussed with the patient that most people either abstain from alcohol or drink within safe limits (<=14/week and <=4 drinks/occasion for males, <=7/weeks and <= 3 drinks/occasion for females) and that the risk for alcohol disorders and other health effects rises proportionally with the number of drinks per week and how often a drinker exceeds daily limits.  Discussed cessation/primary prevention of drug use and availability of treatment for abuse.   Diet: Encouraged to adjust caloric intake to maintain  or achieve ideal body weight, to reduce intake of dietary saturated fat and total fat, to limit sodium intake by avoiding high sodium foods and not adding  table salt, and to maintain adequate dietary potassium and calcium preferably from fresh fruits, vegetables, and low-fat dairy products.    stressed the importance of regular exercise  Injury prevention: Discussed safety belts, safety helmets, smoke detector, smoking near bedding or upholstery.   Dental health: Discussed importance of regular tooth brushing, flossing, and dental visits.    NEXT PREVENTATIVE PHYSICAL DUE IN 1 YEAR. Return in about 6 months (around 07/10/2020) for HTN/HLD, CKD.

## 2020-01-09 LAB — T4, FREE: Free T4: 1.23 ng/dL (ref 0.82–1.77)

## 2020-01-09 LAB — LIPID PANEL W/O CHOL/HDL RATIO
Cholesterol, Total: 258 mg/dL — ABNORMAL HIGH (ref 100–199)
HDL: 85 mg/dL (ref >39)
LDL Chol Calc (NIH): 153 mg/dL — ABNORMAL HIGH (ref 0–99)
Triglycerides: 118 mg/dL (ref 0–149)
VLDL Cholesterol Cal: 20 mg/dL (ref 5–40)

## 2020-01-09 LAB — TSH: TSH: 3.55 u[IU]/mL (ref 0.450–4.500)

## 2020-01-10 NOTE — Progress Notes (Signed)
Contacted via MyChart

## 2020-01-25 ENCOUNTER — Telehealth: Payer: Self-pay | Admitting: Family Medicine

## 2020-01-25 NOTE — Telephone Encounter (Signed)
Pt came to leave disability placard paper work , left form in bin.

## 2020-01-26 NOTE — Telephone Encounter (Signed)
Form placed in providers folder for signature

## 2020-01-26 NOTE — Telephone Encounter (Signed)
Form signed and ready for pick up. Called and let patient know that it was ready.

## 2020-01-28 ENCOUNTER — Other Ambulatory Visit: Payer: Medicare HMO

## 2020-01-29 ENCOUNTER — Ambulatory Visit: Payer: Medicare HMO | Admitting: Oncology

## 2020-03-16 ENCOUNTER — Other Ambulatory Visit: Payer: Self-pay

## 2020-03-16 ENCOUNTER — Ambulatory Visit (INDEPENDENT_AMBULATORY_CARE_PROVIDER_SITE_OTHER): Payer: Medicare HMO

## 2020-03-16 DIAGNOSIS — Z23 Encounter for immunization: Secondary | ICD-10-CM

## 2020-03-25 DIAGNOSIS — N184 Chronic kidney disease, stage 4 (severe): Secondary | ICD-10-CM | POA: Diagnosis not present

## 2020-03-25 DIAGNOSIS — E872 Acidosis: Secondary | ICD-10-CM | POA: Diagnosis not present

## 2020-03-25 DIAGNOSIS — N2581 Secondary hyperparathyroidism of renal origin: Secondary | ICD-10-CM | POA: Diagnosis not present

## 2020-03-25 DIAGNOSIS — I10 Essential (primary) hypertension: Secondary | ICD-10-CM | POA: Diagnosis not present

## 2020-03-25 DIAGNOSIS — E871 Hypo-osmolality and hyponatremia: Secondary | ICD-10-CM | POA: Diagnosis not present

## 2020-07-08 ENCOUNTER — Encounter: Payer: Self-pay | Admitting: Nurse Practitioner

## 2020-07-08 ENCOUNTER — Telehealth: Payer: Self-pay | Admitting: Family Medicine

## 2020-07-08 DIAGNOSIS — I700 Atherosclerosis of aorta: Secondary | ICD-10-CM | POA: Insufficient documentation

## 2020-07-08 NOTE — Telephone Encounter (Signed)
Contacted patient to reschedule her AWVS that was scheduled on Feb 17,2021 due to [facility] schedule changes.   Patient declined - stated she does not feel she needs to have this completed anymore.

## 2020-07-12 ENCOUNTER — Other Ambulatory Visit: Payer: Self-pay

## 2020-07-12 ENCOUNTER — Encounter: Payer: Self-pay | Admitting: Nurse Practitioner

## 2020-07-12 ENCOUNTER — Ambulatory Visit (INDEPENDENT_AMBULATORY_CARE_PROVIDER_SITE_OTHER): Payer: Medicare HMO | Admitting: Nurse Practitioner

## 2020-07-12 VITALS — BP 124/60 | HR 71 | Temp 97.7°F | Wt 160.0 lb

## 2020-07-12 DIAGNOSIS — N2581 Secondary hyperparathyroidism of renal origin: Secondary | ICD-10-CM

## 2020-07-12 DIAGNOSIS — I700 Atherosclerosis of aorta: Secondary | ICD-10-CM | POA: Diagnosis not present

## 2020-07-12 DIAGNOSIS — N184 Chronic kidney disease, stage 4 (severe): Secondary | ICD-10-CM

## 2020-07-12 DIAGNOSIS — I10 Essential (primary) hypertension: Secondary | ICD-10-CM | POA: Diagnosis not present

## 2020-07-12 DIAGNOSIS — E039 Hypothyroidism, unspecified: Secondary | ICD-10-CM

## 2020-07-12 DIAGNOSIS — E785 Hyperlipidemia, unspecified: Secondary | ICD-10-CM | POA: Diagnosis not present

## 2020-07-12 NOTE — Assessment & Plan Note (Signed)
Chronic, ongoing, followed by nephrology.  Continue this collaboration and medication regimen as recommended by them.  Is scheduled for visit and labs with them in one week, will review these.

## 2020-07-12 NOTE — Progress Notes (Signed)
BP 124/60 (BP Location: Left Arm)   Pulse 71   Temp 97.7 F (36.5 C)   Wt 160 lb (72.6 kg) Comment: Patient reported  SpO2 98%   BMI 24.91 kg/m    Subjective:    Patient ID: Karla Little, female    DOB: 1933/11/21, 87 y.o.   MRN: [id number]  HPI: Karla Little is a 87 y.o. female  Chief Complaint  Patient presents with  . Hypertension  . Hyperlipidemia   HYPERTENSION / HYPERLIPIDEMIA Continues on Lisinopril, Amlodipine, Hydralazine.  No current statin, her previous PCP took her off of this a few years back due to myalgias -- these improved with discontinuation.  Aortic atherosclerosis noted on CT 01/27/2019. Satisfied with current treatment? yes Duration of hypertension: chronic BP monitoring frequency: not checking BP range:  BP medication side effects: no Duration of hyperlipidemia: chronic Cholesterol medication side effects: no Cholesterol supplements: none Medication compliance: good compliance Aspirin: no Recent stressors: no Recurrent headaches: no Visual changes: no Palpitations: no Dyspnea: occasional, at baseline Chest pain: no Lower extremity edema: a little bit in ankles at times Dizzy/lightheaded: no  The ASCVD Risk score Mikey Bussing DC Jr., et al., 2013) failed to calculate for the following reasons:   The 2013 ASCVD risk score is only valid for ages 93 to 83  CHRONIC KIDNEY DISEASE Continues to be followed by nephrology with last visit 03/25/20. Reports having been followed by them for 10 years now. Taking Calcitriol, Sodium Bicarb, and Lisinopril.  Labs on recent visit with them -- CRT 1.61, GFR 29, K+ 4.7, PTH 55, BUN 23.   Returns to see him on Monday. CKD status: stable Medications renally dose: yes Previous renal evaluation: yes Pneumovax:  Up to Date Influenza Vaccine:  Up to Date   HYPOTHYROIDISM Continues on Levothyroxine 50 MCG daily with last TSH August 2021 == 3.550. Thyroid control status:stable Satisfied with current treatment?  yes Medication side effects: no Medication compliance: good compliance Etiology of hypothyroidism:  Recent dose adjustment:no Fatigue: no Cold intolerance: no Heat intolerance: no Weight gain: no Weight loss: no Constipation: no Diarrhea/loose stools: no Palpitations: no Lower extremity edema: no Anxiety/depressed mood: no  Relevant past medical, surgical, family and social history reviewed and updated as indicated. Interim medical history since our last visit reviewed. Allergies and medications reviewed and updated.  Review of Systems  Constitutional: Negative for activity change, appetite change, diaphoresis, fatigue and fever.  Respiratory: Negative for cough, chest tightness and shortness of breath.   Cardiovascular: Positive for leg swelling (baseline). Negative for chest pain and palpitations.  Endocrine: Negative for cold intolerance and heat intolerance.  Psychiatric/Behavioral: Negative.     Per HPI unless specifically indicated above     Objective:    BP 124/60 (BP Location: Left Arm)   Pulse 71   Temp 97.7 F (36.5 C)   Wt 160 lb (72.6 kg) Comment: Patient reported  SpO2 98%   BMI 24.91 kg/m   Wt Readings from Last 3 Encounters:  07/12/20 160 lb (72.6 kg)  01/08/20 168 lb 12.8 oz (76.6 kg)  11/07/18 162 lb 8 oz (73.7 kg)    Physical Exam Vitals and nursing note reviewed.  Constitutional:      General: She is awake. She is not in acute distress.    Appearance: She is well-developed and well-groomed. She is not ill-appearing.  HENT:     Head: Normocephalic.     Right Ear: Hearing normal.     Left Ear: Hearing  normal.  Eyes:     General: Lids are normal.        Right eye: No discharge.        Left eye: No discharge.     Conjunctiva/sclera: Conjunctivae normal.     Pupils: Pupils are equal, round, and reactive to light.  Neck:     Thyroid: No thyromegaly.     Vascular: No carotid bruit.  Cardiovascular:     Rate and Rhythm: Normal rate and  regular rhythm.     Heart sounds: Normal heart sounds. No murmur heard. No gallop.   Pulmonary:     Effort: Pulmonary effort is normal. No accessory muscle usage or respiratory distress.     Breath sounds: Normal breath sounds.  Abdominal:     General: Bowel sounds are normal.     Palpations: Abdomen is soft.  Musculoskeletal:     Cervical back: Normal range of motion and neck supple.     Right lower leg: No edema.     Left lower leg: No edema.  Lymphadenopathy:     Cervical: No cervical adenopathy.  Skin:    General: Skin is warm and dry.  Neurological:     Mental Status: She is alert and oriented to person, place, and time.  Psychiatric:        Attention and Perception: Attention normal.        Mood and Affect: Mood normal.        Speech: Speech normal.        Behavior: Behavior normal. Behavior is cooperative.        Thought Content: Thought content normal.     Results for orders placed or performed in visit on 01/08/20  Lipid Panel w/o Chol/HDL Ratio  Result Value Ref Range   Cholesterol, Total 258 (H) 100 - 199 mg/dL   Triglycerides 118 0 - 149 mg/dL   HDL 85 >39 mg/dL   VLDL Cholesterol Cal 20 5 - 40 mg/dL   LDL Chol Calc (NIH) 153 (H) 0 - 99 mg/dL  TSH  Result Value Ref Range   TSH 3.550 0.450 - 4.500 uIU/mL  T4, free  Result Value Ref Range   Free T4 1.23 0.82 - 1.77 ng/dL      Assessment & Plan:   Problem List Items Addressed This Visit      Cardiovascular and Mediastinum   Hypertension    Chronic, stable with BP at goal for age in office today on recheck. Continue current medication regimen and adjust as needed.  Recommend she monitor BP at home at least 3 days a week and document + focus on DASH diet.  Upcoming labs by nephrology scheduled -- will review these.  Return in 6 months.      Aortic atherosclerosis ([place])    Noted on imaging 01/27/2019.  Recommend to continue healthy diet focus. Does not wish to start statin.        Endocrine    Hypothyroidism    Chronic, ongoing.  Continue current medication regimen and adjust as needed.  Obtain TSH and Free T4 next visit, recent labs stable.  Return in 6 months.      Secondary hyperparathyroidism of renal origin ([facility])    Chronic, ongoing.  Followed by nephrology, continue current medication regimen as recommended by them.  Upcoming labs scheduled with them next week, will review these.        Genitourinary   Chronic kidney disease, stage 4, severely decreased GFR ([place]) - Primary  Chronic, ongoing, followed by nephrology.  Continue this collaboration and medication regimen as recommended by them.  Is scheduled for visit and labs with them in one week, will review these.        Other   Hyperlipidemia    Chronic, ongoing.  Statin discontinued a few years back due to myalgias and fatigue, which improved without statin.  She does not wish to restart, this is appropriate based on her current age.          Follow up plan: Return in about 6 months (around 01/09/2021) for Annual physical - meet Dr. Neomia Dear.

## 2020-07-12 NOTE — Assessment & Plan Note (Signed)
Chronic, ongoing.  Followed by nephrology, continue current medication regimen as recommended by them.  Upcoming labs scheduled with them next week, will review these.

## 2020-07-12 NOTE — Assessment & Plan Note (Signed)
Chronic, stable with BP at goal for age in office today on recheck. Continue current medication regimen and adjust as needed.  Recommend she monitor BP at home at least 3 days a week and document + focus on DASH diet.  Upcoming labs by nephrology scheduled -- will review these.  Return in 6 months.

## 2020-07-12 NOTE — Assessment & Plan Note (Signed)
Noted on imaging 01/27/2019.  Recommend to continue healthy diet focus. Does not wish to start statin.

## 2020-07-12 NOTE — Patient Instructions (Signed)
Chronic Kidney Disease, Adult Chronic kidney disease is when lasting damage happens to the kidneys slowly over a long time. The kidneys help to:  Make pee (urine).  Make hormones.  Keep the right amount of fluids and chemicals in the body. Most often, this disease does not go away. You must take steps to help keep the kidney damage from getting worse. If steps are not taken, the kidneys might stop working forever. What are the causes?  Diabetes.  High blood pressure.  Diseases that affect the heart and blood vessels.  Other kidney diseases.  Diseases of the body's disease-fighting system.  A problem with the flow of pee.  Infections of the organs that make pee, store it, and take it out of the body.  Swelling or irritation of your blood vessels. What increases the risk?  Getting older.  Having someone in your family who has kidney disease or kidney failure.  Having a disease caused by genes.  Taking medicines often that harm the kidneys.  Being near or having contact with harmful substances.  Being very overweight.  Using tobacco now or in the past. What are the signs or symptoms?  Feeling very tired.  Having a swollen face, legs, ankles, or feet.  Feeling like you may vomit or vomiting.  Not feeling hungry.  Being confused or not able to focus.  Twitches and cramps in the leg muscles or other muscles.  Dry, itchy skin.  A taste of metal in your mouth.  Making less pee, or making more pee.  Shortness of breath.  Trouble sleeping. You may also become anemic or get weak bones. Anemic means there is not enough red blood cells or hemoglobin in your blood. You may get symptoms slowly. You may not notice them until the kidney damage gets very bad. How is this treated? Often, there is no cure for this disease. Treatment can help with symptoms and help keep the disease from getting worse. You may need to:  Avoid alcohol.  Avoid foods that are high in  salt, potassium, phosphorous, and protein.  Take medicines for symptoms and to help control other conditions.  Have dialysis. This treatment gets harmful waste out of your body.  Treat other problems that cause your kidney disease or make it worse. Follow these instructions at home: Medicines  Take over-the-counter and prescription medicines only as told by your doctor.  Do not take any new medicines, vitamins, or supplements unless your doctor says it is okay. Lifestyle  Do not smoke or use any products that contain nicotine or tobacco. If you need help quitting, ask your doctor.  If you drink alcohol: ? Limit how much you use to:  0-1 drink a day for women who are not pregnant.  0-2 drinks a day for men. ? Know how much alcohol is in your drink. In the [place] one drink equals one 12 oz bottle of beer (355 mL), one 5 oz glass of wine (148 mL), or one 1 oz glass of hard liquor (44 mL).  Stay at a healthy weight. If you need help losing weight, ask your doctor.   General instructions  Follow instructions from your doctor about what you cannot eat or drink.  Track your blood pressure at home. Tell your doctor about any changes.  If you have diabetes, track your blood sugar.  Exercise at least 30 minutes a day, 5 days a week.  Keep your shots (vaccinations) up to date.  Keep all follow-up visits.     Where to find more information  American Association of Kidney Patients: www.aakp.org  National Kidney Foundation: www.kidney.org  American Kidney Fund: www.akfinc.org  Life Options: www.lifeoptions.org  Kidney School: www.kidneyschool.org Contact a doctor if:  Your symptoms get worse.  You get new symptoms. Get help right away if:  You get symptoms of end-stage kidney disease. These include: ? Headaches. ? Losing feeling in your hands or feet. ? Easy bruising. ? Having hiccups often. ? Chest pain. ? Shortness of breath. ? Lack of menstrual periods, in  women.  You have a fever.  You make less pee than normal.  You have pain or you bleed when you pee or poop. These symptoms may be an emergency. Get help right away. Call your local emergency services (911 in the [place]  Do not wait to see if the symptoms will go away.  Do not drive yourself to the hospital. Summary  Chronic kidney disease is when lasting damage happens to the kidneys slowly over a long time.  Causes of this disease include diabetes and high blood pressure.  Often, there is no cure for this disease. Treatment can help symptoms and help keep the disease from getting worse.  Treatment may involve lifestyle changes, medicines, and dialysis. This information is not intended to replace advice given to you by your health care provider. Make sure you discuss any questions you have with your health care provider. Document Revised: 08/26/2019 Document Reviewed: 08/26/2019 Elsevier Patient Education  2021 Elsevier Inc.  

## 2020-07-12 NOTE — Assessment & Plan Note (Signed)
Chronic, ongoing.  Statin discontinued a few years back due to myalgias and fatigue, which improved without statin.  She does not wish to restart, this is appropriate based on her current age.

## 2020-07-12 NOTE — Assessment & Plan Note (Signed)
Chronic, ongoing.  Continue current medication regimen and adjust as needed.  Obtain TSH and Free T4 next visit, recent labs stable.  Return in 6 months.

## 2020-07-18 DIAGNOSIS — R809 Proteinuria, unspecified: Secondary | ICD-10-CM | POA: Diagnosis not present

## 2020-07-18 DIAGNOSIS — N2581 Secondary hyperparathyroidism of renal origin: Secondary | ICD-10-CM | POA: Diagnosis not present

## 2020-07-18 DIAGNOSIS — E871 Hypo-osmolality and hyponatremia: Secondary | ICD-10-CM | POA: Diagnosis not present

## 2020-07-18 DIAGNOSIS — E872 Acidosis: Secondary | ICD-10-CM | POA: Diagnosis not present

## 2020-07-18 DIAGNOSIS — N184 Chronic kidney disease, stage 4 (severe): Secondary | ICD-10-CM | POA: Diagnosis not present

## 2020-07-18 DIAGNOSIS — I10 Essential (primary) hypertension: Secondary | ICD-10-CM | POA: Diagnosis not present

## 2020-07-21 ENCOUNTER — Ambulatory Visit: Payer: Medicare HMO

## 2020-09-20 DIAGNOSIS — H2512 Age-related nuclear cataract, left eye: Secondary | ICD-10-CM | POA: Diagnosis not present

## 2020-09-20 DIAGNOSIS — H40013 Open angle with borderline findings, low risk, bilateral: Secondary | ICD-10-CM | POA: Diagnosis not present

## 2020-10-20 ENCOUNTER — Emergency Department
Admission: EM | Admit: 2020-10-20 | Discharge: 2020-10-20 | Discharge status: Against Medical Advice | Payer: Medicare HMO

## 2020-10-21 DIAGNOSIS — N184 Chronic kidney disease, stage 4 (severe): Secondary | ICD-10-CM | POA: Diagnosis not present

## 2020-10-24 DIAGNOSIS — N390 Urinary tract infection, site not specified: Secondary | ICD-10-CM | POA: Diagnosis not present

## 2020-11-21 DIAGNOSIS — I10 Essential (primary) hypertension: Secondary | ICD-10-CM | POA: Diagnosis not present

## 2020-11-21 DIAGNOSIS — E871 Hypo-osmolality and hyponatremia: Secondary | ICD-10-CM | POA: Diagnosis not present

## 2020-11-21 DIAGNOSIS — E872 Acidosis: Secondary | ICD-10-CM | POA: Diagnosis not present

## 2020-11-21 DIAGNOSIS — N2581 Secondary hyperparathyroidism of renal origin: Secondary | ICD-10-CM | POA: Diagnosis not present

## 2020-11-21 DIAGNOSIS — N184 Chronic kidney disease, stage 4 (severe): Secondary | ICD-10-CM | POA: Diagnosis not present

## 2021-01-09 ENCOUNTER — Ambulatory Visit (INDEPENDENT_AMBULATORY_CARE_PROVIDER_SITE_OTHER): Payer: Medicare HMO | Admitting: Internal Medicine

## 2021-01-09 ENCOUNTER — Encounter: Payer: Self-pay | Admitting: Internal Medicine

## 2021-01-09 ENCOUNTER — Other Ambulatory Visit: Payer: Self-pay

## 2021-01-09 VITALS — BP 150/72 | HR 67 | Temp 98.5°F | Ht 67.32 in | Wt 162.4 lb

## 2021-01-09 DIAGNOSIS — E039 Hypothyroidism, unspecified: Secondary | ICD-10-CM | POA: Diagnosis not present

## 2021-01-09 DIAGNOSIS — N184 Chronic kidney disease, stage 4 (severe): Secondary | ICD-10-CM | POA: Diagnosis not present

## 2021-01-09 DIAGNOSIS — E785 Hyperlipidemia, unspecified: Secondary | ICD-10-CM

## 2021-01-09 NOTE — Progress Notes (Signed)
BP (!) 150/72   Pulse 67   Temp 98.5 F (36.9 C) (Oral)   Ht 5' 7.32" (1.71 m)   Wt 162 lb 6.4 oz (73.7 kg)   SpO2 98%   BMI 25.19 kg/m    Subjective:    Patient ID: Karla Little, female    DOB: 11/06/1933, 87 y.o.   MRN: [id number]  Chief Complaint  Patient presents with   Annual Exam    HPI: Karla Little is a 87 y.o. female  Hypertension This is a chronic problem. The current episode started more than 1 year ago. Pertinent negatives include no chest pain, headaches, palpitations or shortness of breath. Identifiable causes of hypertension include a thyroid problem.  Thyroid Problem Presents for follow-up visit. Patient reports no anxiety, cold intolerance, diarrhea, fatigue, heat intolerance, hoarse voice or palpitations.   Chief Complaint  Patient presents with   Annual Exam    Relevant past medical, surgical, family and social history reviewed and updated as indicated. Interim medical history since our last visit reviewed. Allergies and medications reviewed and updated.  Review of Systems  Constitutional:  Negative for activity change, appetite change, chills, fatigue and fever.  HENT:  Negative for congestion and hoarse voice.   Eyes:  Negative for visual disturbance.  Respiratory:  Negative for apnea, cough, chest tightness, shortness of breath and wheezing.   Cardiovascular:  Negative for chest pain, palpitations and leg swelling.  Gastrointestinal:  Negative for abdominal distention, abdominal pain, diarrhea and nausea.  Endocrine: Negative for cold intolerance, heat intolerance, polydipsia, polyphagia and polyuria.  Genitourinary:  Negative for difficulty urinating, frequency, hematuria and urgency.  Skin:  Negative for color change and rash.  Neurological:  Negative for dizziness, speech difficulty, weakness, light-headedness, numbness and headaches.  Psychiatric/Behavioral:  Negative for behavioral problems and confusion. The patient is not  nervous/anxious.    Per HPI unless specifically indicated above     Objective:    BP (!) 150/72   Pulse 67   Temp 98.5 F (36.9 C) (Oral)   Ht 5' 7.32" (1.71 m)   Wt 162 lb 6.4 oz (73.7 kg)   SpO2 98%   BMI 25.19 kg/m   Wt Readings from Last 3 Encounters:  01/09/21 162 lb 6.4 oz (73.7 kg)  07/12/20 160 lb (72.6 kg)  01/08/20 168 lb 12.8 oz (76.6 kg)    Physical Exam Vitals and nursing note reviewed.  Constitutional:      General: She is not in acute distress.    Appearance: Normal appearance. She is not ill-appearing or diaphoretic.  Eyes:     Conjunctiva/sclera: Conjunctivae normal.  Pulmonary:     Breath sounds: No rhonchi.  Abdominal:     General: Abdomen is flat. Bowel sounds are normal. There is no distension.     Palpations: Abdomen is soft. There is no mass.     Tenderness: There is no abdominal tenderness. There is no guarding.  Skin:    General: Skin is warm and dry.     Coloration: Skin is not jaundiced.     Findings: No erythema.  Neurological:     Mental Status: She is alert.    Results for orders placed or performed in visit on 01/08/20  Lipid Panel w/o Chol/HDL Ratio  Result Value Ref Range   Cholesterol, Total 258 (H) 100 - 199 mg/dL   Triglycerides 118 0 - 149 mg/dL   HDL 85 >39 mg/dL   VLDL Cholesterol Cal 20 5 - 40  mg/dL   LDL Chol Calc (NIH) 153 (H) 0 - 99 mg/dL  TSH  Result Value Ref Range   TSH 3.550 0.450 - 4.500 uIU/mL  T4, free  Result Value Ref Range   Free T4 1.23 0.82 - 1.77 ng/dL        Current Outpatient Medications:    amLODipine (NORVASC) 10 MG tablet, Take 1 tablet (10 mg total) by mouth daily., Disp: 90 tablet, Rfl: 4   calcitRIOL (ROCALTROL) 0.25 MCG capsule, Take 0.25 mcg by mouth daily. , Disp: , Rfl:    calcitRIOL (ROCALTROL) 0.25 MCG capsule, Take 1 tablet by mouth daily., Disp: , Rfl:    hydrALAZINE (APRESOLINE) 25 MG tablet, Take 2 tablets (50 mg total) by mouth daily. (Patient taking differently: Take 25 mg by  mouth daily.), Disp: 180 tablet, Rfl: 4   levothyroxine (SYNTHROID) 50 MCG tablet, Take 1 tablet (50 mcg total) by mouth daily., Disp: 90 tablet, Rfl: 4   lisinopril (ZESTRIL) 10 MG tablet, Take 1 tablet (10 mg total) by mouth daily., Disp: 90 tablet, Rfl: 4   sodium bicarbonate 650 MG tablet, Take 650 mg by mouth 2 (two) times daily., Disp: , Rfl:    vitamin B-12 (CYANOCOBALAMIN) 250 MCG tablet, Take 500 mcg by mouth daily. , Disp: , Rfl:     Assessment & Plan:  HTN  Is on norvasc, lisinopril 10 mg daily and hydrazaline  Continue current meds.  Medication compliance emphasised. pt advised to keep Bp logs. Pt verbalised understanding of the same. Pt to have a low salt diet . Exercise to reach a goal of at least 150 mins a week.  lifestyle modifications explained and pt understands importance of the above.  2. Hypothyroidism:  HYPOTHYROIDISM PLEASE TAKE YOUR THYROID MEDICATION FIRST THING IN THE MORNING WHILST FASTING.  NO MEDICATION/ FOOD FOR AN HOUR AFTER INGESTING THYROID PILLS.   3. CKD sees Dr. Holley Raring @ [facility] - 29   Problem List Items Addressed This Visit   None    Orders Placed This Encounter  Procedures   CBC with Differential/Platelet   Comprehensive metabolic panel   Lipid panel   TSH   Urinalysis, Routine w reflex microscopic   CMP14+EGFR   TSH   CBC with Differential/Platelet   Lipid panel     No orders of the defined types were placed in this encounter.    Follow up plan: No follow-ups on file.

## 2021-01-10 ENCOUNTER — Other Ambulatory Visit: Payer: Self-pay | Admitting: Family Medicine

## 2021-01-10 DIAGNOSIS — N289 Disorder of kidney and ureter, unspecified: Secondary | ICD-10-CM

## 2021-01-10 LAB — CBC WITH DIFFERENTIAL/PLATELET
Basophils Absolute: 0 10*3/uL (ref 0.0–0.2)
Basos: 1 %
EOS (ABSOLUTE): 0.1 10*3/uL (ref 0.0–0.4)
Eos: 2 %
Hematocrit: 37.1 % (ref 34.0–46.6)
Hemoglobin: 12.6 g/dL (ref 11.1–15.9)
Immature Grans (Abs): 0 10*3/uL (ref 0.0–0.1)
Immature Granulocytes: 0 %
Lymphocytes Absolute: 1.5 10*3/uL (ref 0.7–3.1)
Lymphs: 23 %
MCH: 31.5 pg (ref 26.6–33.0)
MCHC: 34 g/dL (ref 31.5–35.7)
MCV: 93 fL (ref 79–97)
Monocytes Absolute: 0.5 10*3/uL (ref 0.1–0.9)
Monocytes: 8 %
Neutrophils Absolute: 4.3 10*3/uL (ref 1.4–7.0)
Neutrophils: 66 %
Platelets: 293 10*3/uL (ref 150–450)
RBC: 4 x10E6/uL (ref 3.77–5.28)
RDW: 12.7 % (ref 11.7–15.4)
WBC: 6.4 10*3/uL (ref 3.4–10.8)

## 2021-01-10 LAB — TSH: TSH: 2.09 u[IU]/mL (ref 0.450–4.500)

## 2021-01-10 LAB — COMPREHENSIVE METABOLIC PANEL
ALT: 9 IU/L (ref 0–32)
AST: 13 IU/L (ref 0–40)
Albumin/Globulin Ratio: 1.7 (ref 1.2–2.2)
Albumin: 4.3 g/dL (ref 3.6–4.6)
Alkaline Phosphatase: 79 IU/L (ref 44–121)
BUN/Creatinine Ratio: 15 (ref 12–28)
BUN: 21 mg/dL (ref 8–27)
Bilirubin Total: 0.3 mg/dL (ref 0.0–1.2)
CO2: 19 mmol/L — ABNORMAL LOW (ref 20–29)
Calcium: 9.7 mg/dL (ref 8.7–10.3)
Chloride: 96 mmol/L (ref 96–106)
Creatinine, Ser: 1.42 mg/dL — ABNORMAL HIGH (ref 0.57–1.00)
Globulin, Total: 2.5 g/dL (ref 1.5–4.5)
Glucose: 97 mg/dL (ref 65–99)
Potassium: 4.7 mmol/L (ref 3.5–5.2)
Sodium: 134 mmol/L (ref 134–144)
Total Protein: 6.8 g/dL (ref 6.0–8.5)
eGFR: 36 mL/min/{1.73_m2} — ABNORMAL LOW (ref >59)

## 2021-01-10 LAB — LIPID PANEL
Chol/HDL Ratio: 3.3 ratio (ref 0.0–4.4)
Cholesterol, Total: 251 mg/dL — ABNORMAL HIGH (ref 100–199)
HDL: 76 mg/dL (ref >39)
LDL Chol Calc (NIH): 150 mg/dL — ABNORMAL HIGH (ref 0–99)
Triglycerides: 140 mg/dL (ref 0–149)
VLDL Cholesterol Cal: 25 mg/dL (ref 5–40)

## 2021-01-10 NOTE — Progress Notes (Signed)
That's fine

## 2021-01-11 ENCOUNTER — Other Ambulatory Visit: Payer: Self-pay | Admitting: Nurse Practitioner

## 2021-01-11 DIAGNOSIS — N184 Chronic kidney disease, stage 4 (severe): Secondary | ICD-10-CM

## 2021-01-11 DIAGNOSIS — I10 Essential (primary) hypertension: Secondary | ICD-10-CM

## 2021-01-11 NOTE — Telephone Encounter (Signed)
Requested medications are due for refill today.  yes  Requested medications are on the active medications list.  yes  Last refill. 01/08/2020  Future visit scheduled.   yes  Notes to clinic.  Prescription is expired.

## 2021-01-12 ENCOUNTER — Encounter: Payer: Self-pay | Admitting: Internal Medicine

## 2021-01-12 NOTE — Telephone Encounter (Signed)
Called patient and informed her that she would just need to follow up with her Nephrologist for her kidney function. Patient verbalized understanding

## 2021-01-12 NOTE — Telephone Encounter (Signed)
Not sure what shes referring to. Pl find out thnx.

## 2021-02-11 ENCOUNTER — Ambulatory Visit: Payer: Medicare HMO

## 2021-02-11 NOTE — Progress Notes (Deleted)
Subjective:   Karla Little is a 87 y.o. female who presents for Medicare Annual preventive examination.  Review of Systems            Objective:    There were no vitals filed for this visit. There is no height or weight on file to calculate BMI.  Advanced Directives 07/16/2019 11/07/2018 09/12/2018 09/11/2018 07/15/2018 09/23/2016 09/13/2016  Does Patient Have a Medical Advance Directive? No Yes No No No No Yes  Type of Advance Directive - - - - - - Living will  Would patient like information on creating a medical advance directive? - - No - Patient declined No - Patient declined - No - Patient declined -    Current Medications (verified) Outpatient Encounter Medications as of 02/11/2021  Medication Sig   amLODipine (NORVASC) 10 MG tablet Take 1 tablet (10 mg total) by mouth daily.   calcitRIOL (ROCALTROL) 0.25 MCG capsule Take 0.25 mcg by mouth daily.    calcitRIOL (ROCALTROL) 0.25 MCG capsule Take 1 tablet by mouth daily.   hydrALAZINE (APRESOLINE) 25 MG tablet Take 2 tablets (50 mg total) by mouth daily. (Patient taking differently: Take 25 mg by mouth daily.)   levothyroxine (SYNTHROID) 50 MCG tablet Take 1 tablet (50 mcg total) by mouth daily.   lisinopril (ZESTRIL) 10 MG tablet TAKE 1 TABLET BY MOUTH EVERY DAY   sodium bicarbonate 650 MG tablet Take 650 mg by mouth 2 (two) times daily.   vitamin B-12 (CYANOCOBALAMIN) 250 MCG tablet Take 500 mcg by mouth daily.    No facility-administered encounter medications on file as of 02/11/2021.    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   Osteoporosis    Renal insufficiency    Vertigo    Past Surgical History:  Procedure Laterality Date   BREAST BIOPSY     COLONOSCOPY W/ POLYPECTOMY     COLONOSCOPY WITH PROPOFOL  N/A 06/02/2015   Procedure: COLONOSCOPY WITH PROPOFOL;  Surgeon: Manya Silvas, MD;  Location: [facility];  Service: Endoscopy;  Laterality: N/A;   Family History  Problem Relation Age of Onset   Hypertension Mother    Heart disease Father    Breast cancer Sister    Social History   Socioeconomic History   Marital status: Married    Spouse name: Not on file   Number of children: Not on file   Years of education: Not on file   Highest education level: Not on file  Occupational History   Not on file  Tobacco Use   Smoking status: Never   Smokeless tobacco: Never  Vaping Use   Vaping Use: Never used  Substance and Sexual Activity   Alcohol use: No   Drug use: No   Sexual activity: Not on file  Other Topics Concern   Not on file  Social History Narrative   Not on file   Social Determinants of Health   Financial Resource Strain: Not on file  Food Insecurity: Not on file  Transportation Needs: Not on file  Physical Activity: Not on file  Stress: Not on file  Social Connections: Not on file    Tobacco Counseling Counseling given: Not Answered   Clinical Intake:  Diabetic?***         Activities of Daily Living In your present state of health, do you have any difficulty performing the following activities: 01/09/2021  Hearing? Y  Vision? Y  Difficulty concentrating or making decisions? N  Walking or climbing stairs? N  Dressing or bathing? N  Doing errands, shopping? N  Some recent data might be hidden    Patient Care Team: Charlynne Cousins, MD as PCP - General (Internal Medicine) Dagoberto Ligas, MD as Referring Physician (Internal Medicine) Manya Silvas, MD (Inactive) (Gastroenterology)  Indicate any recent Medical Services you may have received from other than [facility] providers in the past year (date may be approximate).     Assessment:   This is a routine wellness examination for Karla Little.  Hearing/Vision screen No  results found.  Dietary issues and exercise activities discussed:     Goals Addressed   None    Depression Screen PHQ 2/9 Scores 01/09/2021 01/08/2020 07/16/2019 07/02/2018 06/24/2017 06/22/2016 03/16/2015  PHQ - 2 Score 0 0 0 0 0 0 0  PHQ- 9 Score 0 - - 0 - - -    Fall Risk Fall Risk  01/09/2021 01/08/2020 07/16/2019 10/30/2018 07/02/2018  Falls in the past year? 0 0 0 0 0  Number falls in past yr: 0 0 0 - 0  Injury with Fall? 0 0 0 - 0  Risk for fall due to : No Fall Risks No Fall Risks - - -  Follow up Falls evaluation completed Falls evaluation completed - - -    FALL RISK PREVENTION PERTAINING TO THE HOME:  Any stairs in or around the home? {YES/NO:21197} If so, are there any without handrails? {YES/NO:21197} Home free of loose throw rugs in walkways, pet beds, electrical cords, etc? {YES/NO:21197} Adequate lighting in your home to reduce risk of falls? {YES/NO:21197}  ASSISTIVE DEVICES UTILIZED TO PREVENT FALLS:  Life alert? {YES/NO:21197} Use of a cane, walker or w/c? {YES/NO:21197} Grab bars in the bathroom? {YES/NO:21197} Shower chair or bench in shower? {YES/NO:21197} Elevated toilet seat or a handicapped toilet? {YES/NO:21197}  TIMED UP AND GO:  Was the test performed? {YES/NO:21197}.  Length of time to ambulate 10 feet: *** sec.   {Appearance of OACZ:6606301}  Cognitive Function:        Immunizations Immunization History  Administered Date(s) Administered   Fluad Quad(high Dose 65+) 02/13/2019, 03/16/2020   Influenza, High Dose Seasonal PF 02/28/2016, 03/07/2017, 03/17/2018   Influenza,inj,Quad PF,6+ Mos 03/16/2015   Influenza-Unspecified 02/22/2014   Moderna Sars-Covid-2 Vaccination 07/17/2019, 08/14/2019, 05/05/2020   Pneumococcal Conjugate-13 12/30/2013   Pneumococcal-Unspecified 02/17/2004, 05/03/2009   Td 04/14/2008   Zoster, Live 04/22/2006    {TDAP status:2101805}  {Flu Vaccine status:2101806}  {Pneumococcal vaccine status:2101807}  {Covid-19  vaccine status:2101808}  Qualifies for Shingles Vaccine? {YES/NO:21197}  Zostavax completed {YES/NO:21197}  {Shingrix Completed?:2101804}  Screening Tests Health Maintenance  Topic Date Due   Zoster Vaccines- Shingrix (1 of 2) Never done   TETANUS/TDAP  04/14/2018   COVID-19 Vaccine (4 - Booster for Moderna series) 09/03/2020   INFLUENZA VACCINE  01/02/2021   DEXA SCAN  Completed   PNA vac Low Risk Adult  Completed   HPV VACCINES  Aged Out    Health Maintenance  Health Maintenance Due  Topic Date Due   Zoster Vaccines- Shingrix (1 of 2) Never done   TETANUS/TDAP  04/14/2018   COVID-19 Vaccine (4 - Booster for Moderna series) 09/03/2020   INFLUENZA VACCINE  01/02/2021    {Colorectal cancer screening:2101809}  {  Mammogram status:21018020}  {Bone Density status:21018021}  Lung Cancer Screening: (Low Dose CT Chest recommended if Age 31-80 years, 30 pack-year currently smoking OR have quit w/in 15years.) {DOES NOT does:27190::"does not"} qualify.   Lung Cancer Screening Referral: ***  Additional Screening:  Hepatitis C Screening: {DOES NOT does:27190::"does not"} qualify; Completed ***  Vision Screening: Recommended annual ophthalmology exams for early detection of glaucoma and other disorders of the eye. Is the patient up to date with their annual eye exam?  {YES/NO:21197} Who is the provider or what is the name of the office in which the patient attends annual eye exams? *** If pt is not established with a provider, would they like to be referred to a provider to establish care? {YES/NO:21197}.   Dental Screening: Recommended annual dental exams for proper oral hygiene  Community Resource Referral / Chronic Care Management: CRR required this visit?  {YES/NO:21197}  CCM required this visit?  {YES/NO:21197}     Plan:     I have personally reviewed and noted the following in the patient's chart:   Medical and social history Use of alcohol, tobacco or illicit  drugs  Current medications and supplements including opioid prescriptions.  Functional ability and status Nutritional status Physical activity Advanced directives List of other physicians Hospitalizations, surgeries, and ER visits in previous 12 months Vitals Screenings to include cognitive, depression, and falls Referrals and appointments  In addition, I have reviewed and discussed with patient certain preventive protocols, quality metrics, and best practice recommendations. A written personalized care plan for preventive services as well as general preventive health recommendations were provided to patient.     Jerelene Redden, [place]   02/11/2021   Nurse Notes: ***

## 2021-02-11 NOTE — Progress Notes (Deleted)
Called patient for AWV today, Patient stated that she did not feel well and did not want to do her AWV since she was not well. Made virtual appt for patient Monday with her PCP

## 2021-02-13 ENCOUNTER — Encounter: Payer: Self-pay | Admitting: Internal Medicine

## 2021-02-13 ENCOUNTER — Telehealth (INDEPENDENT_AMBULATORY_CARE_PROVIDER_SITE_OTHER): Payer: Medicare HMO | Admitting: Internal Medicine

## 2021-02-13 ENCOUNTER — Ambulatory Visit (INDEPENDENT_AMBULATORY_CARE_PROVIDER_SITE_OTHER): Payer: Medicare HMO

## 2021-02-13 ENCOUNTER — Ambulatory Visit: Payer: Medicare HMO

## 2021-02-13 DIAGNOSIS — Z0000 Encounter for general adult medical examination without abnormal findings: Secondary | ICD-10-CM

## 2021-02-13 DIAGNOSIS — U071 COVID-19: Secondary | ICD-10-CM

## 2021-02-13 NOTE — Progress Notes (Signed)
Subjective:   Karla Little is a 87 y.o. female who presents for Medicare Annual (Subsequent) preventive examination.I connected with  Romana Deaton Suess on 02/13/21 by a audio enabled telemedicine application and verified that I am speaking with the correct person using two identifiers.  Location of the patient :home Location of provider: office Person participating in the visit: Brittlyn Cloe   I discussed the limitations of evaluation and management by telemedicine. The patient expressed understanding and agreed to proceed.   Review of Systems    Defer to PCP       Objective:    There were no vitals filed for this visit. There is no height or weight on file to calculate BMI.  Advanced Directives 07/16/2019 11/07/2018 09/12/2018 09/11/2018 07/15/2018 09/23/2016 09/13/2016  Does Patient Have a Medical Advance Directive? No Yes No No No No Yes  Type of Advance Directive - - - - - - Living will  Would patient like information on creating a medical advance directive? - - No - Patient declined No - Patient declined - No - Patient declined -    Current Medications (verified) Outpatient Encounter Medications as of 02/13/2021  Medication Sig   amLODipine (NORVASC) 10 MG tablet Take 1 tablet (10 mg total) by mouth daily.   calcitRIOL (ROCALTROL) 0.25 MCG capsule Take 0.25 mcg by mouth daily.    hydrALAZINE (APRESOLINE) 25 MG tablet Take 2 tablets (50 mg total) by mouth daily. (Patient taking differently: Take 25 mg by mouth daily.)   levothyroxine (SYNTHROID) 50 MCG tablet Take 1 tablet (50 mcg total) by mouth daily.   lisinopril (ZESTRIL) 10 MG tablet TAKE 1 TABLET BY MOUTH EVERY DAY   sodium bicarbonate 650 MG tablet Take 650 mg by mouth 2 (two) times daily.   vitamin B-12 (CYANOCOBALAMIN) 250 MCG tablet Take 500 mcg by mouth daily.    No facility-administered encounter medications on file as of 02/13/2021.    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   Osteoporosis    Renal insufficiency    Vertigo    Past Surgical History:  Procedure Laterality Date   BREAST BIOPSY     COLONOSCOPY W/ POLYPECTOMY     COLONOSCOPY WITH PROPOFOL N/A 06/02/2015   Procedure: COLONOSCOPY WITH PROPOFOL;  Surgeon: Manya Silvas, MD;  Location: [facility];  Service: Endoscopy;  Laterality: N/A;   Family History  Problem Relation Age of Onset   Hypertension Mother    Heart disease Father    Breast cancer Sister    Social History   Socioeconomic History   Marital status: Married    Spouse name: Not on file   Number of children: Not on file   Years of education: Not on file   Highest education level: Not on file  Occupational History   Not on file  Tobacco Use   Smoking status: Never   Smokeless tobacco: Never  Vaping Use   Vaping Use: Never used  Substance and Sexual Activity   Alcohol use: No   Drug use: No   Sexual activity: Not on file  Other Topics Concern   Not on file  Social History Narrative   Not on file   Social Determinants of Health   Financial Resource Strain:  Not on file  Food Insecurity: Not on file  Transportation Needs: Not on file  Physical Activity: Not on file  Stress: Not on file  Social Connections: Not on file    Tobacco Counseling Counseling given: Not Answered   Clinical Intake:                 Diabetic?No         Activities of Daily Living In your present state of health, do you have any difficulty performing the following activities: 01/09/2021  Hearing? Y  Vision? Y  Difficulty concentrating or making decisions? N  Walking or climbing stairs? N  Dressing or bathing? N  Doing errands, shopping? N  Some recent data might be hidden    Patient Care Team: Charlynne Cousins, MD as PCP -  General (Internal Medicine) Dagoberto Ligas, MD as Referring Physician (Internal Medicine) Manya Silvas, MD (Inactive) (Gastroenterology)  Indicate any recent Medical Services you may have received from other than [facility] providers in the past year (date may be approximate).     Assessment:   This is a routine wellness examination for Karla Little.  Hearing/Vision screen No results found.  Dietary issues and exercise activities discussed:     Goals Addressed   None    Depression Screen PHQ 2/9 Scores 01/09/2021 01/08/2020 07/16/2019 07/02/2018 06/24/2017 06/22/2016 03/16/2015  PHQ - 2 Score 0 0 0 0 0 0 0  PHQ- 9 Score 0 - - 0 - - -    Fall Risk Fall Risk  02/13/2021 01/09/2021 01/08/2020 07/16/2019 10/30/2018  Falls in the past year? 0 0 0 0 0  Number falls in past yr: 0 0 0 0 -  Injury with Fall? 0 0 0 0 -  Risk for fall due to : No Fall Risks No Fall Risks No Fall Risks - -  Follow up Falls evaluation completed Falls evaluation completed Falls evaluation completed - -    FALL RISK PREVENTION PERTAINING TO THE HOME:  Any stairs in or around the home? Yes  If so, are there any without handrails? No  Home free of loose throw rugs in walkways, pet beds, electrical cords, etc? No  Adequate lighting in your home to reduce risk of falls? Yes   ASSISTIVE DEVICES UTILIZED TO PREVENT FALLS:  Life alert? No  Use of a cane, Loryn Haacke or w/c? No  Grab bars in the bathroom? Yes  Shower chair or bench in shower? No  Elevated toilet seat or a handicapped toilet? No   TIMED UP AND GO:  Was the test performed?  N/A .  Length of time to ambulate 10 feet: N/[place]sec.     Cognitive Function:        Immunizations Immunization History  Administered Date(s) Administered   Fluad Quad(high Dose 65+) 02/13/2019, 03/16/2020   Influenza, High Dose Seasonal PF 02/28/2016, 03/07/2017, 03/17/2018   Influenza,inj,Quad PF,6+ Mos 03/16/2015   Influenza-Unspecified 02/22/2014   Moderna Sars-Covid-2  Vaccination 07/17/2019, 08/14/2019, 05/05/2020   Pneumococcal Conjugate-13 12/30/2013   Pneumococcal-Unspecified 02/17/2004, 05/03/2009   Td 04/14/2008   Zoster, Live 04/22/2006    TDAP status: Due, Education has been provided regarding the importance of this vaccine. Advised may receive this vaccine at local pharmacy or Health Dept. Aware to provide a copy of the vaccination record if obtained from local pharmacy or Health Dept. Verbalized acceptance and understanding.  Flu Vaccine status: Due, Education has been provided regarding the importance of this vaccine. Advised may receive this vaccine at local pharmacy  or Health Dept. Aware to provide a copy of the vaccination record if obtained from local pharmacy or Health Dept. Verbalized acceptance and understanding.  Pneumococcal vaccine status: Due, Education has been provided regarding the importance of this vaccine. Advised may receive this vaccine at local pharmacy or Health Dept. Aware to provide a copy of the vaccination record if obtained from local pharmacy or Health Dept. Verbalized acceptance and understanding.  Covid-19 vaccine status: Declined, Education has been provided regarding the importance of this vaccine but patient still declined. Advised may receive this vaccine at local pharmacy or Health Dept.or vaccine clinic. Aware to provide a copy of the vaccination record if obtained from local pharmacy or Health Dept. Verbalized acceptance and understanding.  Qualifies for Shingles Vaccine? Yes   Zostavax completed Yes   Shingrix Completed?: No.    Education has been provided regarding the importance of this vaccine. Patient has been advised to call insurance company to determine out of pocket expense if they have not yet received this vaccine. Advised may also receive vaccine at local pharmacy or Health Dept. Verbalized acceptance and understanding.  Screening Tests Health Maintenance  Topic Date Due   Zoster Vaccines- Shingrix (1  of 2) Never done   TETANUS/TDAP  04/14/2018   COVID-19 Vaccine (4 - Booster for Moderna series) 09/03/2020   INFLUENZA VACCINE  01/02/2021   DEXA SCAN  Completed   PNA vac Low Risk Adult  Completed   HPV VACCINES  Aged Out    Health Maintenance  Health Maintenance Due  Topic Date Due   Zoster Vaccines- Shingrix (1 of 2) Never done   TETANUS/TDAP  04/14/2018   COVID-19 Vaccine (4 - Booster for Moderna series) 09/03/2020   INFLUENZA VACCINE  01/02/2021    Colorectal cancer screening: No longer required.      Lung Cancer Screening: (Low Dose CT Chest recommended if Age 46-80 years, 30 pack-year currently smoking OR have quit w/in 15years.) does not qualify.   Lung Cancer Screening Referral: N/A  Additional Screening:  Hepatitis C Screening: does not qualify  Vision Screening: Recommended annual ophthalmology exams for early detection of glaucoma and other disorders of the eye. Is the patient up to date with their annual eye exam?  Yes  Who is the provider or what is the name of the office in which the patient attends annual eye exams? Dr.Woodars If pt is not established with a provider, would they like to be referred to a provider to establish care? No .   Dental Screening: Recommended annual dental exams for proper oral hygiene  Community Resource Referral / Chronic Care Management: CRR required this visit?  No   CCM required this visit?  No      Plan:     I have personally reviewed and noted the following in the patient's chart:   Medical and social history Use of alcohol, tobacco or illicit drugs  Current medications and supplements including opioid prescriptions.  Functional ability and status Nutritional status Physical activity Advanced directives List of other physicians Hospitalizations, surgeries, and ER visits in previous 12 months Vitals Screenings to include cognitive, depression, and falls Referrals and appointments  In addition, I have  reviewed and discussed with patient certain preventive protocols, quality metrics, and best practice recommendations. A written personalized care plan for preventive services as well as general preventive health recommendations were provided to patient.     Linus Galas, [place]   02/13/2021   Nurse Notes: non face to face 45 mins  Ms. Argueta , Thank you for taking time to come for your Medicare Wellness Visit. I appreciate your ongoing commitment to your health goals. Please review the following plan we discussed and let me know if I can assist you in the future.   These are the goals we discussed:  Goals   None     This is a list of the screening recommended for you and due dates:  Health Maintenance  Topic Date Due   Zoster (Shingles) Vaccine (1 of 2) Never done   Tetanus Vaccine  04/14/2018   COVID-19 Vaccine (4 - Booster for Moderna series) 09/03/2020   Flu Shot  01/02/2021   DEXA scan (bone density measurement)  Completed   Pneumonia vaccines  Completed   HPV Vaccine  Aged Out

## 2021-02-13 NOTE — Progress Notes (Signed)
There were no vitals taken for this visit.   Subjective:    Patient ID: Karla Little, female    DOB: [birth year], 87 y.o.   MRN: [id number]  Chief Complaint  Patient presents with  . Covid Positive    Symptoms started on Wednesday, tested positive on Thursday. Still having fatigue and dizziness. No cough    HPI: Karla Little is a 87 y.o. female  patient presents with: Covid Positive: Symptoms started on Wednesday, tested positive on Thursday. Still having fatigue and dizziness. No cough  Headache went away the next day was sick all night long on Wednesday and now has only fatigued , is a little dizzy and woozy gets tired. Eating and drinking a lot better.   Headache  This is a new problem. The current episode started in the past 7 days. The problem has been gradually improving. Pertinent negatives include no abdominal pain, abnormal behavior, anorexia, back pain, facial sweating, fever, hearing loss, insomnia, scalp tenderness, seizures, sinus pressure or sore throat.   Chief Complaint  Patient presents with  . Covid Positive    Symptoms started on Wednesday, tested positive on Thursday. Still having fatigue and dizziness. No cough    Relevant past medical, surgical, family and social history reviewed and updated as indicated. Interim medical history since our last visit reviewed. Allergies and medications reviewed and updated.  Review of Systems  Constitutional:  Negative for fever.  HENT:  Negative for hearing loss, sinus pressure and sore throat.   Gastrointestinal:  Negative for abdominal pain and anorexia.  Musculoskeletal:  Negative for back pain.  Neurological:  Positive for headaches. Negative for seizures.  Psychiatric/Behavioral:  The patient does not have insomnia.    Per HPI unless specifically indicated above     Objective:    There were no vitals taken for this visit.  Wt Readings from Last 3 Encounters:  01/09/21 162 lb 6.4 oz (73.7 kg)   07/12/20 160 lb (72.6 kg)  01/08/20 168 lb 12.8 oz (76.6 kg)    Physical Exam  Unable to peform sec to virtual visit.         Assessment & Plan:  COVID : positive : via home test. Increase fluid intake.  Headahce - tyelnol every 4-6 hrs prn and alternate this with ibubrufen 800 mg q 8 hrly. Sinus pressure: use steam inhalation.  OTC -  Allegra / claritin.  5 days quarantine.  Ok to rtw in 5 days if tests -ve follow  Pt verbalized understanding of such.  Problem List Items Addressed This Visit   None    No orders of the defined types were placed in this encounter.    No orders of the defined types were placed in this encounter.    Follow up plan: No follow-ups on file.   This visit was completed via telephone due to the restrictions of the COVID-19 pandemic. All issues as above were discussed and addressed but no physical exam was performed. If it was felt that the patient should be evaluated in the office, they were directed there. The patient verbally consented to this visit. Patient was unable to complete an audio/visual visit due to Technical difficulties", "Lack of internet. Due to the catastrophic nature of the COVID-19 pandemic, this visit was done through audio contact only. Location of the patient: home Location of the provider: work Those involved with this call:  Provider: Charlynne Cousins, MD CMA: Frazier Butt, [place] Desk/Registration: Roe Rutherford  Time spent  on call:  10 minutes on the phone discussing health concerns. 10 minutes total spent in review of patient's record and preparation of their chart.

## 2021-02-15 ENCOUNTER — Other Ambulatory Visit: Payer: Self-pay | Admitting: Nurse Practitioner

## 2021-02-15 DIAGNOSIS — I10 Essential (primary) hypertension: Secondary | ICD-10-CM

## 2021-02-15 DIAGNOSIS — N184 Chronic kidney disease, stage 4 (severe): Secondary | ICD-10-CM

## 2021-03-16 ENCOUNTER — Other Ambulatory Visit: Payer: Self-pay | Admitting: Nurse Practitioner

## 2021-03-16 DIAGNOSIS — E039 Hypothyroidism, unspecified: Secondary | ICD-10-CM

## 2021-03-16 NOTE — Telephone Encounter (Signed)
Requested Prescriptions  Pending Prescriptions Disp Refills  . levothyroxine (SYNTHROID) 50 MCG tablet [Pharmacy Med Name: LEVOTHYROXINE 50 MCG TABLET] 90 tablet 3    Sig: TAKE 1 TABLET BY MOUTH EVERY DAY     Endocrinology:  Hypothyroid Agents Failed - 03/16/2021  3:47 AM      Failed - TSH needs to be rechecked within 3 months after an abnormal result. Refill until TSH is due.      Passed - TSH in normal range and within 360 days    TSH  Date Value Ref Range Status  01/09/2021 2.090 0.450 - 4.500 uIU/mL Final         Passed - Valid encounter within last 12 months    Recent Outpatient Visits          1 month ago [place], MD   2 months ago Hypothyroidism, unspecified type   [facility] Vigg, Avanti, MD   8 months ago Chronic kidney disease, stage 4, severely decreased GFR ([place])   [place], Barbaraann Faster, NP   1 year ago Routine general medical examination at a health care facility   [place], Henrine Screws T, NP   2 years ago Essential hypertension   [place], MD      Future Appointments            In 3 months Vigg, Avanti, MD [facility]

## 2021-03-27 ENCOUNTER — Other Ambulatory Visit: Payer: Self-pay | Admitting: Nurse Practitioner

## 2021-03-27 DIAGNOSIS — I10 Essential (primary) hypertension: Secondary | ICD-10-CM

## 2021-03-27 NOTE — Telephone Encounter (Signed)
Requested medication (s) are due for refill today: Yes  Requested medication (s) are on the active medication list: Yes  Last refill:  01/06/20  Future visit scheduled: Yes  Notes to clinic:  Prescription expired.    Requested Prescriptions  Pending Prescriptions Disp Refills   hydrALAZINE (APRESOLINE) 25 MG tablet [Pharmacy Med Name: HYDRALAZINE 25 MG TABLET] 180 tablet 4    Sig: TAKE 2 TABLETS BY MOUTH EVERY DAY     Cardiovascular:  Vasodilators Failed - 03/27/2021  1:30 AM      Failed - Last BP in normal range    BP Readings from Last 1 Encounters:  01/09/21 (!) 150/72          Passed - HCT in normal range and within 360 days    Hematocrit  Date Value Ref Range Status  01/09/2021 37.1 34.0 - 46.6 % Final          Passed - HGB in normal range and within 360 days    Hemoglobin  Date Value Ref Range Status  01/09/2021 12.6 11.1 - 15.9 g/dL Final          Passed - RBC in normal range and within 360 days    RBC  Date Value Ref Range Status  01/09/2021 4.00 3.77 - 5.28 x10E6/uL Final  09/14/2018 3.76 (L) 3.87 - 5.11 MIL/uL Final          Passed - WBC in normal range and within 360 days    WBC  Date Value Ref Range Status  01/09/2021 6.4 3.4 - 10.8 x10E3/uL Final  09/14/2018 5.9 4.0 - 10.5 K/uL Final          Passed - PLT in normal range and within 360 days    Platelets  Date Value Ref Range Status  01/09/2021 293 150 - 450 x10E3/uL Final          Passed - Valid encounter within last 12 months    Recent Outpatient Visits           1 month ago COVID-19   [facility] Vigg, Avanti, MD   2 months ago Hypothyroidism, unspecified type   [facility] Vigg, Avanti, MD   8 months ago Chronic kidney disease, stage 4, severely decreased GFR ([place])   [place], Barbaraann Faster, NP   1 year ago Routine general medical examination at a health care facility   [place], Henrine Screws T, NP   2 years ago  Essential hypertension   [place], Jeannette How, MD       Future Appointments             In 3 months Vigg, Avanti, MD [facility], [facility]

## 2021-03-28 NOTE — Telephone Encounter (Signed)
Please see below and advise.

## 2021-05-18 ENCOUNTER — Emergency Department: Payer: Medicare HMO

## 2021-05-18 ENCOUNTER — Emergency Department
Admission: EM | Admit: 2021-05-18 | Discharge: 2021-05-19 | Disposition: A | Discharge status: Home / Self Care | Payer: Medicare HMO | Attending: Emergency Medicine | Admitting: Emergency Medicine

## 2021-05-18 DIAGNOSIS — R946 Abnormal results of thyroid function studies: Secondary | ICD-10-CM | POA: Diagnosis not present

## 2021-05-18 DIAGNOSIS — E860 Dehydration: Secondary | ICD-10-CM | POA: Insufficient documentation

## 2021-05-18 DIAGNOSIS — R000 Tachycardia, unspecified: Secondary | ICD-10-CM | POA: Diagnosis not present

## 2021-05-18 DIAGNOSIS — Z87891 Personal history of nicotine dependence: Secondary | ICD-10-CM | POA: Insufficient documentation

## 2021-05-18 DIAGNOSIS — R42 Dizziness and giddiness: Secondary | ICD-10-CM | POA: Diagnosis not present

## 2021-05-18 DIAGNOSIS — E039 Hypothyroidism, unspecified: Secondary | ICD-10-CM | POA: Diagnosis not present

## 2021-05-18 DIAGNOSIS — N3001 Acute cystitis with hematuria: Secondary | ICD-10-CM

## 2021-05-18 DIAGNOSIS — R002 Palpitations: Secondary | ICD-10-CM | POA: Diagnosis not present

## 2021-05-18 DIAGNOSIS — I959 Hypotension, unspecified: Secondary | ICD-10-CM | POA: Diagnosis not present

## 2021-05-18 DIAGNOSIS — I129 Hypertensive chronic kidney disease with stage 1 through stage 4 chronic kidney disease, or unspecified chronic kidney disease: Secondary | ICD-10-CM | POA: Diagnosis not present

## 2021-05-18 DIAGNOSIS — Z20822 Contact with and (suspected) exposure to covid-19: Secondary | ICD-10-CM | POA: Insufficient documentation

## 2021-05-18 DIAGNOSIS — I10 Essential (primary) hypertension: Secondary | ICD-10-CM | POA: Diagnosis not present

## 2021-05-18 DIAGNOSIS — Z743 Need for continuous supervision: Secondary | ICD-10-CM | POA: Diagnosis not present

## 2021-05-18 DIAGNOSIS — R61 Generalized hyperhidrosis: Secondary | ICD-10-CM | POA: Diagnosis not present

## 2021-05-18 DIAGNOSIS — R7989 Other specified abnormal findings of blood chemistry: Secondary | ICD-10-CM

## 2021-05-18 DIAGNOSIS — K449 Diaphragmatic hernia without obstruction or gangrene: Secondary | ICD-10-CM | POA: Diagnosis not present

## 2021-05-18 DIAGNOSIS — N184 Chronic kidney disease, stage 4 (severe): Secondary | ICD-10-CM | POA: Insufficient documentation

## 2021-05-18 LAB — URINALYSIS, COMPLETE (UACMP) WITH MICROSCOPIC
Bilirubin Urine: NEGATIVE
Glucose, UA: NEGATIVE mg/dL
Hgb urine dipstick: NEGATIVE
Ketones, ur: NEGATIVE mg/dL
Nitrite: NEGATIVE
Protein, ur: NEGATIVE mg/dL
Specific Gravity, Urine: 1.01 (ref 1.005–1.030)
Squamous Epithelial / HPF: NONE SEEN (ref 0–5)
pH: 7 (ref 5.0–8.0)

## 2021-05-18 LAB — CBC WITH DIFFERENTIAL/PLATELET
Abs Immature Granulocytes: 0.04 10*3/uL (ref 0.00–0.07)
Basophils Absolute: 0.1 10*3/uL (ref 0.0–0.1)
Basophils Relative: 1 %
Eosinophils Absolute: 0.1 10*3/uL (ref 0.0–0.5)
Eosinophils Relative: 1 %
HCT: 36.2 % (ref 36.0–46.0)
Hemoglobin: 12.3 g/dL (ref 12.0–15.0)
Immature Granulocytes: 0 %
Lymphocytes Relative: 10 %
Lymphs Abs: 1.1 10*3/uL (ref 0.7–4.0)
MCH: 32 pg (ref 26.0–34.0)
MCHC: 34 g/dL (ref 30.0–36.0)
MCV: 94.3 fL (ref 80.0–100.0)
Monocytes Absolute: 0.7 10*3/uL (ref 0.1–1.0)
Monocytes Relative: 7 %
Neutro Abs: 9 10*3/uL — ABNORMAL HIGH (ref 1.7–7.7)
Neutrophils Relative %: 81 %
Platelets: 252 10*3/uL (ref 150–400)
RBC: 3.84 MIL/uL — ABNORMAL LOW (ref 3.87–5.11)
RDW: 12.8 % (ref 11.5–15.5)
WBC: 11 10*3/uL — ABNORMAL HIGH (ref 4.0–10.5)
nRBC: 0 % (ref 0.0–0.2)

## 2021-05-18 LAB — BASIC METABOLIC PANEL
Anion gap: 8 (ref 5–15)
BUN: 37 mg/dL — ABNORMAL HIGH (ref 8–23)
CO2: 20 mmol/L — ABNORMAL LOW (ref 22–32)
Calcium: 9.3 mg/dL (ref 8.9–10.3)
Chloride: 100 mmol/L (ref 98–111)
Creatinine, Ser: 1.77 mg/dL — ABNORMAL HIGH (ref 0.44–1.00)
GFR, Estimated: 28 mL/min — ABNORMAL LOW (ref >60)
Glucose, Bld: 124 mg/dL — ABNORMAL HIGH (ref 70–99)
Potassium: 3.7 mmol/L (ref 3.5–5.1)
Sodium: 128 mmol/L — ABNORMAL LOW (ref 135–145)

## 2021-05-18 LAB — MAGNESIUM: Magnesium: 2.2 mg/dL (ref 1.7–2.4)

## 2021-05-18 LAB — RESP PANEL BY RT-PCR (FLU A&B, COVID) ARPGX2
Influenza A by PCR: NEGATIVE
Influenza B by PCR: NEGATIVE
SARS Coronavirus 2 by RT PCR: NEGATIVE

## 2021-05-18 LAB — TROPONIN I (HIGH SENSITIVITY): Troponin I (High Sensitivity): 9 ng/L (ref <18)

## 2021-05-18 LAB — TSH: TSH: 6.175 u[IU]/mL — ABNORMAL HIGH (ref 0.350–4.500)

## 2021-05-18 MED ORDER — LACTATED RINGERS IV BOLUS
1000.0000 mL | Freq: Once | INTRAVENOUS | Status: AC
  Administered 2021-05-18: 1000 mL via INTRAVENOUS

## 2021-05-18 NOTE — ED Triage Notes (Signed)
Pt c/o of sudden episode of dizziness, feeling diaphoretic and palpitations 1 hour ago. Denies falling or loc. Denies any chest pain.  No cardiac hx. States she had a similar episode 1 year ago.

## 2021-05-18 NOTE — ED Notes (Signed)
Pt updated on need for urine sample. Pt denies any need to go at this time. Will let me know when they are able to do so.  °

## 2021-05-18 NOTE — ED Notes (Signed)
ED Provider at bedside. 

## 2021-05-18 NOTE — ED Notes (Signed)
Patient transported to X-ray 

## 2021-05-18 NOTE — ED Notes (Signed)
Pt returned from xray

## 2021-05-18 NOTE — ED Provider Notes (Signed)
[facility] Emergency Department Provider Note  ____________________________________________   Event Date/Time   First MD Initiated Contact with Patient 05/18/21 2057     (approximate)  I have reviewed the triage vital signs and the nursing notes.   HISTORY  Chief Complaint Palpitations   HPI Karla Little is a 87 y.o. female with a past medical history of HTN, HDL, CKD, osteoporosis, vertigo, and aortic atherosclerosis who presents EMS for assessment of an episode of palpitations that occurred earlier this evening.  Patient states it lasted around 10 to 15 minutes when she got up to go turn off a light outside.  She states she almost feels back to baseline now but still feels a little off.  She states she has similar episode about a year ago.  She denies any associated lightheadedness, vertigo, headache, vision changes, chest pain, cough, shortness of breath, nausea, vomiting, diarrhea, rash, burning with urination, recent falls or injuries or any other acute sick symptoms.  States he is been compliant with all her medications and has not had any change in p.o. intake.  Otherwise has been in usual state health before this occurred.         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   Osteoporosis    Renal insufficiency    Vertigo     Patient Active Problem List   Diagnosis Date Noted   Aortic atherosclerosis ([place]) 07/08/2020   Secondary hyperparathyroidism of renal origin ([place]) 06/10/2019   Advanced care planning/counseling discussion 06/24/2017   Hypothyroidism 05/05/2015   Hyperlipidemia 05/05/2015   Atrophy of right kidney 03/16/2015   Chronic kidney disease, stage 4, severely decreased GFR ([place]) 03/16/2015   Shoulder bursitis 03/16/2015   Hypertension     Past Surgical History:  Procedure Laterality  Date   BREAST BIOPSY     COLONOSCOPY W/ POLYPECTOMY     COLONOSCOPY WITH PROPOFOL N/A 06/02/2015   Procedure: COLONOSCOPY WITH PROPOFOL;  Surgeon: Manya Silvas, MD;  Location: [place];  Service: Endoscopy;  Laterality: N/A;    Prior to Admission medications   Medication Sig Start Date End Date Taking? Authorizing Provider  amLODipine (NORVASC) 10 MG tablet TAKE 1 TABLET BY MOUTH EVERY DAY 02/15/21  Yes Vigg, Avanti, MD  calcitRIOL (ROCALTROL) 0.25 MCG capsule Take 0.25 mcg by mouth daily.  11/24/15  Yes [provider]  hydrALAZINE (APRESOLINE) 25 MG tablet TAKE 2 TABLETS BY MOUTH EVERY DAY 03/28/21  Yes Vigg, Avanti, MD  levothyroxine (SYNTHROID) 50 MCG tablet TAKE 1 TABLET BY MOUTH EVERY DAY 03/16/21  Yes Vigg, Avanti, MD  lisinopril (ZESTRIL) 10 MG tablet TAKE 1 TABLET BY MOUTH EVERY DAY 01/11/21  Yes Vigg, Avanti, MD  sodium bicarbonate 650 MG tablet Take 650 mg by mouth 2 (two) times daily.   Yes [provider]  vitamin B-12 (CYANOCOBALAMIN) 250 MCG tablet Take 500 mcg by mouth daily.    Yes [provider]    Allergies Amoxicillin and Sod citrate-citric acid  Family History  Problem Relation Age of Onset   Hypertension Mother    Heart disease Father    Breast cancer Sister     Social History Social History   Tobacco Use   Smoking status: Never    Passive exposure: Never   Smokeless tobacco: Never  Vaping  Use   Vaping Use: Never used  Substance Use Topics   Alcohol use: No   Drug use: No    Review of Systems  Review of Systems  Constitutional:  Negative for chills and fever.  HENT:  Negative for sore throat.   Eyes:  Negative for pain.  Respiratory:  Negative for cough and stridor.   Cardiovascular:  Positive for palpitations. Negative for chest pain.  Gastrointestinal:  Negative for vomiting.  Genitourinary:  Negative for dysuria.  Musculoskeletal:  Negative for myalgias.  Skin:  Negative for rash.  Neurological:   Negative for seizures, loss of consciousness and headaches.  Psychiatric/Behavioral:  Negative for suicidal ideas.   All other systems reviewed and are negative.    ____________________________________________   PHYSICAL EXAM:  VITAL SIGNS: ED Triage Vitals  Enc Vitals Group     BP      Pulse      Resp      Temp      Temp src      SpO2      Weight      Height      Head Circumference      Peak Flow      Pain Score      Pain Loc      Pain Edu?      Excl. in [place]?    Vitals:   05/18/21 2130 05/18/21 2200  BP:    Pulse: 69 65  Resp: 14 15  Temp:    SpO2: 99% 99%   Physical Exam Vitals and nursing note reviewed.  Constitutional:      General: She is not in acute distress.    Appearance: She is well-developed.  HENT:     Head: Normocephalic and atraumatic.  Eyes:     Conjunctiva/sclera: Conjunctivae normal.  Cardiovascular:     Rate and Rhythm: Normal rate and regular rhythm.     Heart sounds: No murmur heard. Pulmonary:     Effort: Pulmonary effort is normal. No respiratory distress.     Breath sounds: Normal breath sounds.  Abdominal:     Palpations: Abdomen is soft.     Tenderness: There is no abdominal tenderness.  Musculoskeletal:        General: No swelling.     Cervical back: Neck supple.  Skin:    General: Skin is warm and dry.     Capillary Refill: Capillary refill takes less than 2 seconds.  Neurological:     Mental Status: She is alert.  Psychiatric:        Mood and Affect: Mood normal.    Cranial nerves II through XII grossly intact.  No pronator drift.  No finger dysmetria.  Symmetric 5/5 strength of all extremities.  Sensation intact to light touch in all extremities.  Unremarkable unassisted gait.   ____________________________________________   LABS (all labs ordered are listed, but only abnormal results are displayed)  Labs Reviewed  CBC WITH DIFFERENTIAL/PLATELET - Abnormal; Notable for the following components:      Result Value    WBC 11.0 (*)    RBC 3.84 (*)    Neutro Abs 9.0 (*)    All other components within normal limits  BASIC METABOLIC PANEL - Abnormal; Notable for the following components:   Sodium 128 (*)    CO2 20 (*)    Glucose, Bld 124 (*)    BUN 37 (*)    Creatinine, Ser 1.77 (*)    GFR, Estimated 28 (*)  All other components within normal limits  TSH - Abnormal; Notable for the following components:   TSH 6.175 (*)    All other components within normal limits  RESP PANEL BY RT-PCR (FLU A&B, COVID) ARPGX2  MAGNESIUM  URINALYSIS, COMPLETE (UACMP) WITH MICROSCOPIC  TROPONIN I (HIGH SENSITIVITY)  TROPONIN I (HIGH SENSITIVITY)   ____________________________________________  EKG  ECG remarkable for sinus rhythm with a ventricular rate of 101, right bundle branch block and left posterior fascicle block with otherwise unremarkable intervals and nonspecific ST changes in inferior and anterior leads without evidence of other acute ischemia or significant arrhythmia.  This Is compared to ECG from 09/12/2018 had evidence of a right bundle branch block at that time as well as nonspecific changes in anterior and inferior leads. ____________________________________________  RADIOLOGY  ED MD interpretation: CXR  is markable for stable hiatal hernia without evidence of an overt edema, focal consolidation, effusion, pneumothorax or other acute thoracic process.  Official radiology report(s): DG Chest 2 View  Result Date: 05/18/2021 CLINICAL DATA:  Dizziness, palpitations, diaphoresis EXAM: CHEST - 2 VIEW COMPARISON:  07/15/2018 FINDINGS: Frontal and lateral views of the chest demonstrate an unremarkable cardiac silhouette. Stable hiatal hernia. No acute airspace disease, effusion, or pneumothorax. No acute bony abnormalities. IMPRESSION: 1. No acute intrathoracic process. 2. Stable hiatal hernia. Electronically Signed   By: Randa Ngo M.D.   On: 05/18/2021 21:49     ____________________________________________   PROCEDURES  Procedure(s) performed (including Critical Care):  .1-3 Lead EKG Interpretation Performed by: Lucrezia Starch, MD Authorized by: Lucrezia Starch, MD     Interpretation: non-specific     ECG rate assessment: normal     Rhythm: sinus rhythm     Ectopy: none     Conduction: abnormal     ____________________________________________   INITIAL IMPRESSION / ASSESSMENT AND PLAN / ED COURSE      Patient presents with above-stated history exam for assessment of an episode of palpitations associated with "feeling off" that lasted a few minutes and made her concerned enough to call 911 and occurred shortly prior to arrival.  On arrival patient is hypertensive with otherwise stable vital signs on room air.  She has a nonfocal supine neurological exam and states she is almost back to baseline.  Differential considerations include paroxysmal tachyarrhythmia possibly related to ischemia, hypothyroidism, metabolic derangements, anemia, dehydration and infectious process.  CXR  is markable for stable hiatal hernia without evidence of an overt edema, focal consolidation, effusion, pneumothorax or other acute thoracic process.  ECG remarkable for sinus rhythm with a ventricular rate of 101, right bundle branch block and left posterior fascicle block with otherwise unremarkable intervals and nonspecific ST changes in inferior and anterior leads without evidence of other acute ischemia or significant arrhythmia.  This Is compared to ECG from 09/12/2018 that had evidence of a right bundle branch block at that time as well as nonspecific changes in anterior and inferior leads.  Changes today are more pronounced.  Well initial troponin is not elevated I have a lower suspicion for ACS or myocarditis we will plan to obtain a delta repeat troponin and if this is nonelevated I think an acute ischemic event will have been excluded at this  point.  CBC shows WBC count of 11 without evidence of acute anemia and normal platelets.  BMP remarkable for mild hyponatremia with a sodium of 128, bicarb of 20, BUN of 37 and a creatinine of 1.77 compared to 1.4 to 4 months ago.  No other significant electrolyte or metabolic derangements.  Magnesium is within normal limits.  COVID influenza PCR is negative.  Patient seen by [place] care patient signed over to assuming provider approximately 11:30 PM.  Plan is to follow-up repeat troponin and UA.  Repeat troponin is reassuring patient can likely be safely discharged home with plan for outpatient PCP and cardiology follow-up.      ____________________________________________   FINAL CLINICAL IMPRESSION(S) / ED DIAGNOSES  Final diagnoses:  Palpitations  Dehydration  High serum thyroid stimulating hormone (TSH)    Medications  lactated ringers bolus 1,000 mL (1,000 mLs Intravenous New Bag/Given 05/18/21 2218)     ED Discharge Orders     None        Note:  This document was prepared using Dragon voice recognition software and may include unintentional dictation errors.    Lucrezia Starch, MD 05/18/21 [phone]

## 2021-05-18 NOTE — Discharge Instructions (Addendum)
Your TSH was slightly elevated today and you should have this followed up by your primary care physician.  As we discussed I also recommended follow-up with cardiology.  Palpitations today as well as with your primary care doctor to have your kidney function rechecked in the next 2 or 3 days. Urine was positive for UTI. Take antibiotics as prescribed. Drink plenty of fluids and see your doctor in a few days to have your kidney function rechecked. Return to the ER for abdominal pain, chest pain, shortness of breath, flank pain, fever, vomiting.

## 2021-05-19 LAB — TROPONIN I (HIGH SENSITIVITY): Troponin I (High Sensitivity): 12 ng/L (ref <18)

## 2021-05-19 LAB — T4, FREE: Free T4: 1.01 ng/dL (ref 0.61–1.12)

## 2021-05-19 MED ORDER — CEPHALEXIN 500 MG PO CAPS
500.0000 mg | ORAL_CAPSULE | Freq: Once | ORAL | Status: AC
  Administered 2021-05-19: 500 mg via ORAL
  Filled 2021-05-19: qty 1

## 2021-05-19 MED ORDER — CEPHALEXIN 500 MG PO CAPS: 500.0000 mg | ORAL_CAPSULE | Freq: Three times a day (TID) | ORAL | 0 refills | Status: AC

## 2021-05-19 NOTE — ED Provider Notes (Signed)
Repeat troponin is negative.  UA positive for UTI.  Patient does have symptoms of UTI with dysuria.  No signs of sepsis.  Will start Keflex.  Patient received IV fluids for mild bump in her creatinine.  Recommend increase oral hydration and close follow-up with PCP in 3 days for repeat blood work.  Patient feels improved.  She has been monitoring telemetry no signs of dysrhythmias here.  Will discharge with plan left in place by Dr. Tamala Julian with patient and her daughter.  Patient and daughter comfortable with this.  We did discuss all the results and standards recommendations for return to the emergency department     Alfred Levins, [place], MD 05/19/21 [phone]

## 2021-05-19 NOTE — ED Notes (Signed)
E-signature pad unavailable - Pt verbalized understanding of D/C information - no additional concerns at this time.  

## 2021-05-21 DIAGNOSIS — R002 Palpitations: Secondary | ICD-10-CM | POA: Diagnosis not present

## 2021-05-21 DIAGNOSIS — N390 Urinary tract infection, site not specified: Secondary | ICD-10-CM | POA: Diagnosis not present

## 2021-05-21 DIAGNOSIS — E860 Dehydration: Secondary | ICD-10-CM | POA: Diagnosis not present

## 2021-06-24 ENCOUNTER — Other Ambulatory Visit: Payer: Self-pay | Admitting: Internal Medicine

## 2021-06-24 DIAGNOSIS — I10 Essential (primary) hypertension: Secondary | ICD-10-CM

## 2021-06-24 NOTE — Telephone Encounter (Signed)
last RF 03/28/21 #60 4 RF too soon  Requested Prescriptions  Refused Prescriptions Disp Refills   hydrALAZINE (APRESOLINE) 25 MG tablet [Pharmacy Med Name: HYDRALAZINE 25 MG TABLET] 180 tablet 1    Sig: TAKE 2 TABLETS BY MOUTH EVERY DAY     Cardiovascular:  Vasodilators Failed - 06/24/2021 10:07 AM      Failed - RBC in normal range and within 360 days    RBC  Date Value Ref Range Status  05/18/2021 3.84 (L) 3.87 - 5.11 MIL/uL Final         Failed - WBC in normal range and within 360 days    WBC  Date Value Ref Range Status  05/18/2021 11.0 (H) 4.0 - 10.5 K/uL Final         Failed - Last BP in normal range    BP Readings from Last 1 Encounters:  05/19/21 (!) 141/65         Passed - HCT in normal range and within 360 days    HCT  Date Value Ref Range Status  05/18/2021 36.2 36.0 - 46.0 % Final   Hematocrit  Date Value Ref Range Status  01/09/2021 37.1 34.0 - 46.6 % Final         Passed - HGB in normal range and within 360 days    Hemoglobin  Date Value Ref Range Status  05/18/2021 12.3 12.0 - 15.0 g/dL Final  01/09/2021 12.6 11.1 - 15.9 g/dL Final         Passed - PLT in normal range and within 360 days    Platelets  Date Value Ref Range Status  05/18/2021 252 150 - 400 K/uL Final  01/09/2021 293 150 - 450 x10E3/uL Final         Passed - Valid encounter within last 12 months    Recent Outpatient Visits          4 months ago [place] Vigg, Avanti, MD   5 months ago Hypothyroidism, unspecified type   [facility] Vigg, Avanti, MD   11 months ago Chronic kidney disease, stage 4, severely decreased GFR ([place])   [place], Jolene T, NP   1 year ago Routine general medical examination at a health care facility   [place], Henrine Screws T, NP   2 years ago Essential hypertension   [place], Jeannette How, MD      Future Appointments            In 2 weeks Vigg,  Avanti, MD [facility], [facility]

## 2021-07-05 ENCOUNTER — Other Ambulatory Visit: Payer: Self-pay

## 2021-07-05 ENCOUNTER — Other Ambulatory Visit: Payer: Medicare HMO

## 2021-07-05 DIAGNOSIS — N184 Chronic kidney disease, stage 4 (severe): Secondary | ICD-10-CM | POA: Diagnosis not present

## 2021-07-05 DIAGNOSIS — E785 Hyperlipidemia, unspecified: Secondary | ICD-10-CM

## 2021-07-05 DIAGNOSIS — N289 Disorder of kidney and ureter, unspecified: Secondary | ICD-10-CM

## 2021-07-05 DIAGNOSIS — E039 Hypothyroidism, unspecified: Secondary | ICD-10-CM | POA: Diagnosis not present

## 2021-07-06 DIAGNOSIS — N289 Disorder of kidney and ureter, unspecified: Secondary | ICD-10-CM | POA: Diagnosis not present

## 2021-07-06 LAB — URINALYSIS, ROUTINE W REFLEX MICROSCOPIC
Bilirubin, UA: NEGATIVE
Glucose, UA: NEGATIVE
Ketones, UA: NEGATIVE
Leukocytes,UA: NEGATIVE
Nitrite, UA: NEGATIVE
Protein,UA: NEGATIVE
RBC, UA: NEGATIVE
Specific Gravity, UA: 1.01 (ref 1.005–1.030)
Urobilinogen, Ur: 0.2 mg/dL (ref 0.2–1.0)
pH, UA: 6.5 (ref 5.0–7.5)

## 2021-07-06 LAB — CBC WITH DIFFERENTIAL/PLATELET
Basophils Absolute: 0 10*3/uL (ref 0.0–0.2)
Basos: 1 %
EOS (ABSOLUTE): 0.1 10*3/uL (ref 0.0–0.4)
Eos: 2 %
Hematocrit: 41.2 % (ref 34.0–46.6)
Hemoglobin: 13.5 g/dL (ref 11.1–15.9)
Immature Grans (Abs): 0 10*3/uL (ref 0.0–0.1)
Immature Granulocytes: 0 %
Lymphocytes Absolute: 1.1 10*3/uL (ref 0.7–3.1)
Lymphs: 15 %
MCH: 31 pg (ref 26.6–33.0)
MCHC: 32.8 g/dL (ref 31.5–35.7)
MCV: 95 fL (ref 79–97)
Monocytes Absolute: 0.6 10*3/uL (ref 0.1–0.9)
Monocytes: 8 %
Neutrophils Absolute: 5.3 10*3/uL (ref 1.4–7.0)
Neutrophils: 74 %
Platelets: 227 10*3/uL (ref 150–450)
RBC: 4.35 x10E6/uL (ref 3.77–5.28)
RDW: 12.4 % (ref 11.7–15.4)
WBC: 7.2 10*3/uL (ref 3.4–10.8)

## 2021-07-06 LAB — CMP14+EGFR
ALT: 9 IU/L (ref 0–32)
AST: 11 IU/L (ref 0–40)
Albumin/Globulin Ratio: 1.8 (ref 1.2–2.2)
Albumin: 4.3 g/dL (ref 3.6–4.6)
Alkaline Phosphatase: 72 IU/L (ref 44–121)
BUN/Creatinine Ratio: 15 (ref 12–28)
BUN: 30 mg/dL — ABNORMAL HIGH (ref 8–27)
Bilirubin Total: 0.6 mg/dL (ref 0.0–1.2)
CO2: 21 mmol/L (ref 20–29)
Calcium: 10 mg/dL (ref 8.7–10.3)
Chloride: 101 mmol/L (ref 96–106)
Creatinine, Ser: 2.01 mg/dL — ABNORMAL HIGH (ref 0.57–1.00)
Globulin, Total: 2.4 g/dL (ref 1.5–4.5)
Glucose: 100 mg/dL — ABNORMAL HIGH (ref 70–99)
Potassium: 4.8 mmol/L (ref 3.5–5.2)
Sodium: 138 mmol/L (ref 134–144)
Total Protein: 6.7 g/dL (ref 6.0–8.5)
eGFR: 24 mL/min/{1.73_m2} — ABNORMAL LOW (ref >59)

## 2021-07-06 LAB — LIPID PANEL
Chol/HDL Ratio: 3.3 ratio (ref 0.0–4.4)
Cholesterol, Total: 247 mg/dL — ABNORMAL HIGH (ref 100–199)
HDL: 75 mg/dL (ref >39)
LDL Chol Calc (NIH): 146 mg/dL — ABNORMAL HIGH (ref 0–99)
Triglycerides: 147 mg/dL (ref 0–149)
VLDL Cholesterol Cal: 26 mg/dL (ref 5–40)

## 2021-07-12 ENCOUNTER — Other Ambulatory Visit: Payer: Self-pay

## 2021-07-12 ENCOUNTER — Ambulatory Visit (INDEPENDENT_AMBULATORY_CARE_PROVIDER_SITE_OTHER): Payer: Medicare HMO | Admitting: Internal Medicine

## 2021-07-12 ENCOUNTER — Encounter: Payer: Self-pay | Admitting: Internal Medicine

## 2021-07-12 VITALS — BP 153/79 | HR 62 | Temp 98.1°F | Ht 67.32 in | Wt 163.0 lb

## 2021-07-12 DIAGNOSIS — I10 Essential (primary) hypertension: Secondary | ICD-10-CM

## 2021-07-12 DIAGNOSIS — E039 Hypothyroidism, unspecified: Secondary | ICD-10-CM | POA: Diagnosis not present

## 2021-07-12 DIAGNOSIS — E785 Hyperlipidemia, unspecified: Secondary | ICD-10-CM

## 2021-07-12 DIAGNOSIS — N184 Chronic kidney disease, stage 4 (severe): Secondary | ICD-10-CM

## 2021-07-12 DIAGNOSIS — N261 Atrophy of kidney (terminal): Secondary | ICD-10-CM | POA: Diagnosis not present

## 2021-07-12 MED ORDER — FAMOTIDINE 20 MG PO TABS: 20.0000 mg | ORAL_TABLET | Freq: Every day | ORAL | 2 refills | Status: DC

## 2021-07-12 MED ORDER — HYDRALAZINE HCL 25 MG PO TABS: 25.0000 mg | ORAL_TABLET | Freq: Two times a day (BID) | ORAL | 4 refills | Status: DC

## 2021-07-12 NOTE — Patient Instructions (Signed)
Latest Reference Range & Units 07/05/21 08:27  Sodium 134 - 144 mmol/L 138  Potassium 3.5 - 5.2 mmol/L 4.8  Chloride 96 - 106 mmol/L 101  CO2 20 - 29 mmol/L 21  Glucose 70 - 99 mg/dL 100 (H)  BUN 8 - 27 mg/dL 30 (H)  Creatinine 0.57 - 1.00 mg/dL 2.01 (H)  Calcium 8.7 - 10.3 mg/dL 10.0  BUN/Creatinine Ratio 12 - 28  15  eGFR >59 mL/min/1.73 24 (L)  Alkaline Phosphatase 44 - 121 IU/L 72  Albumin 3.6 - 4.6 g/dL 4.3  Albumin/Globulin Ratio 1.2 - 2.2  1.8  AST 0 - 40 IU/L 11  ALT 0 - 32 IU/L 9  Total Protein 6.0 - 8.5 g/dL 6.7  Total Bilirubin 0.0 - 1.2 mg/dL 0.6  Total CHOL/HDL Ratio 0.0 - 4.4 ratio 3.3  Cholesterol, Total 100 - 199 mg/dL 247 (H)  HDL Cholesterol >39 mg/dL 75  Triglycerides 0 - 149 mg/dL 147  VLDL Cholesterol Cal 5 - 40 mg/dL 26  LDL Chol Calc (NIH) 0 - 99 mg/dL 146 (H)  Globulin, Total 1.5 - 4.5 g/dL 2.4  WBC 3.4 - 10.8 x10E3/uL 7.2  RBC 3.77 - 5.28 x10E6/uL 4.35  Hemoglobin 11.1 - 15.9 g/dL 13.5  HCT 34.0 - 46.6 % 41.2  MCV 79 - 97 fL 95  MCH 26.6 - 33.0 pg 31.0  MCHC 31.5 - 35.7 g/dL 32.8  RDW 11.7 - 15.4 % 12.4  Platelets 150 - 450 x10E3/uL 227  Neutrophils Not Estab. % 74  Immature Granulocytes Not Estab. % 0  NEUT# 1.4 - 7.0 x10E3/uL 5.3  Lymphocyte # 0.7 - 3.1 x10E3/uL 1.1  Monocytes Absolute 0.1 - 0.9 x10E3/uL 0.6  Basophils Absolute 0.0 - 0.2 x10E3/uL 0.0  Immature Grans (Abs) 0.0 - 0.1 x10E3/uL 0.0  Lymphs Not Estab. % 15  Monocytes Not Estab. % 8  Basos Not Estab. % 1  Eos Not Estab. % 2  EOS (ABSOLUTE) 0.0 - 0.4 x10E3/uL 0.1  (H): Data is abnormally high (L): Data is abnormally low

## 2021-07-12 NOTE — Progress Notes (Signed)
BP (!) 153/79    Pulse 62    Temp 98.1 F (36.7 C) (Oral)    Ht 5' 7.32" (1.71 m)    Wt 163 lb (73.9 kg)    SpO2 100%    BMI 25.29 kg/m    Subjective:    Patient ID: Karla Little, female    DOB: 09-17-1933, 88 y.o.   MRN: [id number]  Chief Complaint  Patient presents with   Hypothyroidism   Hypertension    HPI: Karla Little is a 88 y.o. female  Hypertension This is a chronic problem. The current episode started more than 1 year ago. The problem has been gradually worsening since onset. The problem is controlled. Pertinent negatives include no anxiety, blurred vision, chest pain, headaches, malaise/fatigue, neck pain, orthopnea, palpitations, peripheral edema, PND, shortness of breath or sweats.  Hyperlipidemia This is a chronic problem. The current episode started more than 1 year ago. The problem is controlled. Pertinent negatives include no chest pain or shortness of breath.   Chief Complaint  Patient presents with   Hypothyroidism   Hypertension    Relevant past medical, surgical, family and social history reviewed and updated as indicated. Interim medical history since our last visit reviewed. Allergies and medications reviewed and updated.  Review of Systems  Constitutional:  Negative for malaise/fatigue.  Eyes:  Negative for blurred vision.  Respiratory:  Negative for shortness of breath.   Cardiovascular:  Negative for chest pain, palpitations, orthopnea and PND.  Musculoskeletal:  Negative for neck pain.  Neurological:  Negative for headaches.   Per HPI unless specifically indicated above     Objective:    BP (!) 153/79    Pulse 62    Temp 98.1 F (36.7 C) (Oral)    Ht 5' 7.32" (1.71 m)    Wt 163 lb (73.9 kg)    SpO2 100%    BMI 25.29 kg/m   Wt Readings from Last 3 Encounters:  07/12/21 163 lb (73.9 kg)  05/18/21 160 lb (72.6 kg)  01/09/21 162 lb 6.4 oz (73.7 kg)    Physical Exam Vitals and nursing note reviewed.  Constitutional:      General:  She is not in acute distress.    Appearance: Normal appearance. She is not ill-appearing or diaphoretic.  Eyes:     Conjunctiva/sclera: Conjunctivae normal.  Pulmonary:     Breath sounds: No rhonchi.  Abdominal:     General: Abdomen is flat. Bowel sounds are normal. There is no distension.     Palpations: Abdomen is soft. There is no mass.     Tenderness: There is no abdominal tenderness. There is no guarding.  Skin:    General: Skin is warm and dry.     Coloration: Skin is not jaundiced.     Findings: No erythema.  Neurological:     Mental Status: She is alert.    Results for orders placed or performed in visit on 07/05/21  Lipid panel  Result Value Ref Range   Cholesterol, Total 247 (H) 100 - 199 mg/dL   Triglycerides 147 0 - 149 mg/dL   HDL 75 >39 mg/dL   VLDL Cholesterol Cal 26 5 - 40 mg/dL   LDL Chol Calc (NIH) 146 (H) 0 - 99 mg/dL   Chol/HDL Ratio 3.3 0.0 - 4.4 ratio  CBC with Differential/Platelet  Result Value Ref Range   WBC 7.2 3.4 - 10.8 x10E3/uL   RBC 4.35 3.77 - 5.28 x10E6/uL   Hemoglobin 13.5  11.1 - 15.9 g/dL   Hematocrit 41.2 34.0 - 46.6 %   MCV 95 79 - 97 fL   MCH 31.0 26.6 - 33.0 pg   MCHC 32.8 31.5 - 35.7 g/dL   RDW 12.4 11.7 - 15.4 %   Platelets 227 150 - 450 x10E3/uL   Neutrophils 74 Not Estab. %   Lymphs 15 Not Estab. %   Monocytes 8 Not Estab. %   Eos 2 Not Estab. %   Basos 1 Not Estab. %   Neutrophils Absolute 5.3 1.4 - 7.0 x10E3/uL   Lymphocytes Absolute 1.1 0.7 - 3.1 x10E3/uL   Monocytes Absolute 0.6 0.1 - 0.9 x10E3/uL   EOS (ABSOLUTE) 0.1 0.0 - 0.4 x10E3/uL   Basophils Absolute 0.0 0.0 - 0.2 x10E3/uL   Immature Granulocytes 0 Not Estab. %   Immature Grans (Abs) 0.0 0.0 - 0.1 x10E3/uL  CMP14+EGFR  Result Value Ref Range   Glucose 100 (H) 70 - 99 mg/dL   BUN 30 (H) 8 - 27 mg/dL   Creatinine, Ser 2.01 (H) 0.57 - 1.00 mg/dL   eGFR 24 (L) >59 mL/min/1.73   BUN/Creatinine Ratio 15 12 - 28   Sodium 138 134 - 144 mmol/L   Potassium 4.8 3.5 -  5.2 mmol/L   Chloride 101 96 - 106 mmol/L   CO2 21 20 - 29 mmol/L   Calcium 10.0 8.7 - 10.3 mg/dL   Total Protein 6.7 6.0 - 8.5 g/dL   Albumin 4.3 3.6 - 4.6 g/dL   Globulin, Total 2.4 1.5 - 4.5 g/dL   Albumin/Globulin Ratio 1.8 1.2 - 2.2   Bilirubin Total 0.6 0.0 - 1.2 mg/dL   Alkaline Phosphatase 72 44 - 121 IU/L   AST 11 0 - 40 IU/L   ALT 9 0 - 32 IU/L  Urinalysis, Routine w reflex microscopic  Result Value Ref Range   Specific Gravity, UA 1.010 1.005 - 1.030   pH, UA 6.5 5.0 - 7.5   Color, UA Yellow Yellow   Appearance Ur Clear Clear   Leukocytes,UA Negative Negative   Protein,UA Negative Negative/Trace   Glucose, UA Negative Negative   Ketones, UA Negative Negative   RBC, UA Negative Negative   Bilirubin, UA Negative Negative   Urobilinogen, Ur 0.2 0.2 - 1.0 mg/dL   Nitrite, UA Negative Negative        Current Outpatient Medications:    amLODipine (NORVASC) 10 MG tablet, TAKE 1 TABLET BY MOUTH EVERY DAY, Disp: 90 tablet, Rfl: 1   calcitRIOL (ROCALTROL) 0.25 MCG capsule, Take 0.25 mcg by mouth daily. , Disp: , Rfl:    levothyroxine (SYNTHROID) 50 MCG tablet, TAKE 1 TABLET BY MOUTH EVERY DAY, Disp: 90 tablet, Rfl: 3   lisinopril (ZESTRIL) 10 MG tablet, TAKE 1 TABLET BY MOUTH EVERY DAY, Disp: 90 tablet, Rfl: 4   sodium bicarbonate 650 MG tablet, Take 650 mg by mouth 2 (two) times daily., Disp: , Rfl:    vitamin B-12 (CYANOCOBALAMIN) 250 MCG tablet, Take 500 mcg by mouth daily. , Disp: , Rfl:    hydrALAZINE (APRESOLINE) 25 MG tablet, Take 1 tablet (25 mg total) by mouth in the morning and at bedtime., Disp: 60 tablet, Rfl: 4    Assessment & Plan:  HTN Is on norvasc, lisinopril 10 mg daily and hydrazaline 25 mg daily. Increase hydralazine to bid. Continue current meds.  Medication compliance emphasised. pt advised to keep Bp logs. Pt verbalised understanding of the same. Pt to have a low salt diet .  Exercise to reach a goal of at least 150 mins a week.  lifestyle  modifications explained and pt understands importance of the above.   2. Hypothyroidism:  HYPOTHYROIDISM PLEASE TAKE YOUR THYROID MEDICATION FIRST THING IN THE MORNING WHILST FASTING.  NO MEDICATION/ FOOD FOR AN HOUR AFTER INGESTING THYROID PILLS.     3. CKD sees Dr. Holley Raring @ [facility],  EGFR - 29  Has right kidney atrophy.  Needs to fu with nephrology asap.   Problem List Items Addressed This Visit       Cardiovascular and Mediastinum   Hypertension   Relevant Medications   hydrALAZINE (APRESOLINE) 25 MG tablet     Endocrine   Hypothyroidism - Primary     Genitourinary   Atrophy of right kidney   Chronic kidney disease, stage 4, severely decreased GFR (HCC)     Other   Hyperlipidemia   Relevant Medications   hydrALAZINE (APRESOLINE) 25 MG tablet   Other Visit Diagnoses     Essential hypertension       Relevant Medications   hydrALAZINE (APRESOLINE) 25 MG tablet        No orders of the defined types were placed in this encounter.    Meds ordered this encounter  Medications   hydrALAZINE (APRESOLINE) 25 MG tablet    Sig: Take 1 tablet (25 mg total) by mouth in the morning and at bedtime.    Dispense:  60 tablet    Refill:  4     Follow up plan: No follow-ups on file.

## 2021-07-18 DIAGNOSIS — R809 Proteinuria, unspecified: Secondary | ICD-10-CM | POA: Diagnosis not present

## 2021-07-18 DIAGNOSIS — I10 Essential (primary) hypertension: Secondary | ICD-10-CM | POA: Diagnosis not present

## 2021-07-18 DIAGNOSIS — N2581 Secondary hyperparathyroidism of renal origin: Secondary | ICD-10-CM | POA: Diagnosis not present

## 2021-07-18 DIAGNOSIS — N184 Chronic kidney disease, stage 4 (severe): Secondary | ICD-10-CM | POA: Diagnosis not present

## 2021-07-31 DIAGNOSIS — H40053 Ocular hypertension, bilateral: Secondary | ICD-10-CM | POA: Diagnosis not present

## 2021-08-14 ENCOUNTER — Other Ambulatory Visit: Payer: Self-pay | Admitting: Internal Medicine

## 2021-08-14 DIAGNOSIS — I10 Essential (primary) hypertension: Secondary | ICD-10-CM

## 2021-08-14 DIAGNOSIS — N184 Chronic kidney disease, stage 4 (severe): Secondary | ICD-10-CM

## 2021-08-14 NOTE — Telephone Encounter (Signed)
Requested Prescriptions  ?Pending Prescriptions Disp Refills  ?? amLODipine (NORVASC) 10 MG tablet [Pharmacy Med Name: AMLODIPINE BESYLATE 10 MG TAB] 90 tablet 1  ?  Sig: TAKE 1 TABLET BY MOUTH EVERY DAY  ?  ? Cardiovascular: Calcium Channel Blockers 2 Failed - 08/14/2021  1:49 AM  ?  ?  Failed - Last BP in normal range  ?  BP Readings from Last 1 Encounters:  ?07/12/21 (!) 153/79  ?   ?  ?  Passed - Last Heart Rate in normal range  ?  Pulse Readings from Last 1 Encounters:  ?07/12/21 62  ?   ?  ?  Passed - Valid encounter within last 6 months  ?  Recent Outpatient Visits   ?      ? 1 month ago Hypothyroidism, unspecified type  ? [facility] Vigg, Avanti, MD  ? 6 months ago COVID-19  ? [facility] Vigg, Avanti, MD  ? 7 months ago Hypothyroidism, unspecified type  ? [facility] Vigg, Avanti, MD  ? 1 year ago Chronic kidney disease, stage 4, severely decreased GFR ([place])  ? [place], Henrine Screws T, NP  ? 1 year ago Routine general medical examination at a health care facility  ? [facility] [place], Henrine Screws T, NP  ?  ?  ?Future Appointments   ?        ? In 1 month Vigg, Avanti, MD [facility]  ?  ? ?  ?  ?  ? ?

## 2021-08-17 ENCOUNTER — Ambulatory Visit: Payer: Self-pay | Admitting: *Deleted

## 2021-08-17 NOTE — Telephone Encounter (Signed)
Summary: Medication Question  ? Pt stated she just picked up Rx famotidine (PEPCID) 20 MG tablet says take at night. Pt stated has been taking for about two months amLODipine (NORVASC) 10 MG tablet.  ?Pt stated she is unsure if she should be taking so much medication at night and is requesting a call back.   ? ?Seeking clinical advice  ?  ? ? ?Called patient to review medication questions. No answer, LVMTCB [phone]. ?

## 2021-08-17 NOTE — Telephone Encounter (Signed)
Called patient to review medication information. Patient reports she feels she has it figured out and she was looking at the "bottle" wrong. She understands when she can take her medication and what the medication is for.  ?

## 2021-08-29 DIAGNOSIS — H2512 Age-related nuclear cataract, left eye: Secondary | ICD-10-CM | POA: Diagnosis not present

## 2021-09-13 ENCOUNTER — Other Ambulatory Visit: Payer: Self-pay | Admitting: Internal Medicine

## 2021-09-13 DIAGNOSIS — I10 Essential (primary) hypertension: Secondary | ICD-10-CM

## 2021-09-13 NOTE — Telephone Encounter (Signed)
Requested Prescriptions  ?Pending Prescriptions Disp Refills  ?? hydrALAZINE (APRESOLINE) 25 MG tablet [Pharmacy Med Name: HYDRALAZINE 25 MG TABLET] 180 tablet 1  ?  Sig: TAKE 1 TABLET (25 MG TOTAL) BY MOUTH IN THE MORNING AND AT BEDTIME.  ?  ? Cardiovascular:  Vasodilators Failed - 09/13/2021  7:41 AM  ?  ?  Failed - ANA Screen, Ifa, Serum in normal range and within 360 days  ?  No results found for: ANA, ANATITER, LABANTI   ?  ?  Failed - Last BP in normal range  ?  BP Readings from Last 1 Encounters:  ?07/12/21 (!) 153/79  ?   ?  ?  Passed - HCT in normal range and within 360 days  ?  Hematocrit  ?Date Value Ref Range Status  ?07/05/2021 41.2 34.0 - 46.6 % Final  ?   ?  ?  Passed - HGB in normal range and within 360 days  ?  Hemoglobin  ?Date Value Ref Range Status  ?07/05/2021 13.5 11.1 - 15.9 g/dL Final  ?   ?  ?  Passed - RBC in normal range and within 360 days  ?  RBC  ?Date Value Ref Range Status  ?07/05/2021 4.35 3.77 - 5.28 x10E6/uL Final  ?05/18/2021 3.84 (L) 3.87 - 5.11 MIL/uL Final  ?   ?  ?  Passed - WBC in normal range and within 360 days  ?  WBC  ?Date Value Ref Range Status  ?07/05/2021 7.2 3.4 - 10.8 x10E3/uL Final  ?05/18/2021 11.0 (H) 4.0 - 10.5 K/uL Final  ?   ?  ?  Passed - PLT in normal range and within 360 days  ?  Platelets  ?Date Value Ref Range Status  ?07/05/2021 227 150 - 450 x10E3/uL Final  ?   ?  ?  Passed - Valid encounter within last 12 months  ?  Recent Outpatient Visits   ?      ? 2 months ago Hypothyroidism, unspecified type  ? [facility] Vigg, Avanti, MD  ? 7 months ago COVID-19  ? [facility] Vigg, Avanti, MD  ? 8 months ago Hypothyroidism, unspecified type  ? [facility] Vigg, Avanti, MD  ? 1 year ago Chronic kidney disease, stage 4, severely decreased GFR ([place])  ? [place], Henrine Screws T, NP  ? 1 year ago Routine general medical examination at a health care facility  ? [facility] [place], Henrine Screws T, NP  ?   ?  ?Future Appointments   ?        ? In 3 weeks Vigg, Avanti, MD [facility]  ?  ? ?  ?  ?  ? ? ?

## 2021-09-14 ENCOUNTER — Encounter: Payer: Self-pay | Admitting: Ophthalmology

## 2021-09-18 NOTE — Discharge Instructions (Signed)

## 2021-09-20 ENCOUNTER — Encounter
Admission: RE | Disposition: A | Discharge status: Home / Self Care | Payer: Self-pay | Source: Home / Self Care | Attending: Ophthalmology

## 2021-09-20 ENCOUNTER — Encounter: Payer: Self-pay | Admitting: Ophthalmology

## 2021-09-20 ENCOUNTER — Ambulatory Visit: Payer: Medicare HMO | Admitting: Anesthesiology

## 2021-09-20 ENCOUNTER — Other Ambulatory Visit: Payer: Self-pay

## 2021-09-20 ENCOUNTER — Ambulatory Visit
Admission: RE | Admit: 2021-09-20 | Discharge: 2021-09-20 | Disposition: A | Discharge status: Home / Self Care | Payer: Medicare HMO | Attending: Ophthalmology | Admitting: Ophthalmology

## 2021-09-20 DIAGNOSIS — E039 Hypothyroidism, unspecified: Secondary | ICD-10-CM | POA: Diagnosis not present

## 2021-09-20 DIAGNOSIS — H2512 Age-related nuclear cataract, left eye: Secondary | ICD-10-CM | POA: Insufficient documentation

## 2021-09-20 DIAGNOSIS — I129 Hypertensive chronic kidney disease with stage 1 through stage 4 chronic kidney disease, or unspecified chronic kidney disease: Secondary | ICD-10-CM | POA: Insufficient documentation

## 2021-09-20 DIAGNOSIS — N189 Chronic kidney disease, unspecified: Secondary | ICD-10-CM | POA: Diagnosis not present

## 2021-09-20 DIAGNOSIS — H25812 Combined forms of age-related cataract, left eye: Secondary | ICD-10-CM | POA: Diagnosis not present

## 2021-09-20 HISTORY — PX: CATARACT EXTRACTION W/PHACO: SHX586

## 2021-09-20 SURGERY — PHACOEMULSIFICATION, CATARACT, WITH IOL INSERTION
Anesthesia: Monitor Anesthesia Care | Site: Eye | Laterality: Left

## 2021-09-20 MED ORDER — MIDAZOLAM HCL 2 MG/2ML IJ SOLN
INTRAMUSCULAR | Status: DC | PRN
  Administered 2021-09-20: .5 mg via INTRAVENOUS

## 2021-09-20 MED ORDER — ACETAMINOPHEN 160 MG/5ML PO SOLN: 325.0000 mg | Freq: Once | ORAL | Status: DC

## 2021-09-20 MED ORDER — ACETAMINOPHEN 325 MG PO TABS: 325.0000 mg | ORAL_TABLET | Freq: Once | ORAL | Status: DC

## 2021-09-20 MED ORDER — SIGHTPATH DOSE#1 BSS IO SOLN
INTRAOCULAR | Status: DC | PRN
  Administered 2021-09-20: 15 mL

## 2021-09-20 MED ORDER — ARMC OPHTHALMIC DILATING DROPS
1.0000 "application " | OPHTHALMIC | Status: DC | PRN
  Administered 2021-09-20 (×3): 1 via OPHTHALMIC

## 2021-09-20 MED ORDER — TETRACAINE HCL 0.5 % OP SOLN
1.0000 [drp] | OPHTHALMIC | Status: DC | PRN
  Administered 2021-09-20 (×3): 1 [drp] via OPHTHALMIC

## 2021-09-20 MED ORDER — MOXIFLOXACIN HCL 0.5 % OP SOLN
OPHTHALMIC | Status: DC | PRN
  Administered 2021-09-20: 0.2 mL via OPHTHALMIC

## 2021-09-20 MED ORDER — SIGHTPATH DOSE#1 BSS IO SOLN
INTRAOCULAR | Status: DC | PRN
  Administered 2021-09-20: 104 mL via OPHTHALMIC

## 2021-09-20 MED ORDER — FENTANYL CITRATE (PF) 100 MCG/2ML IJ SOLN
INTRAMUSCULAR | Status: DC | PRN
  Administered 2021-09-20: 50 ug via INTRAVENOUS

## 2021-09-20 MED ORDER — LIDOCAINE HCL (PF) 2 % IJ SOLN
INTRAMUSCULAR | Status: DC | PRN
  Administered 2021-09-20: 1 mL via INTRAMUSCULAR

## 2021-09-20 MED ORDER — LACTATED RINGERS IV SOLN: INTRAVENOUS | Status: DC

## 2021-09-20 MED ORDER — BRIMONIDINE TARTRATE-TIMOLOL 0.2-0.5 % OP SOLN
OPHTHALMIC | Status: DC | PRN
Start: 2021-09-20 — End: 2021-09-20
  Administered 2021-09-20: 1 [drp] via OPHTHALMIC

## 2021-09-20 MED ORDER — SIGHTPATH DOSE#1 NA HYALUR & NA CHOND-NA HYALUR IO KIT
PACK | INTRAOCULAR | Status: DC | PRN
Start: 2021-09-20 — End: 2021-09-20
  Administered 2021-09-20: 1 via OPHTHALMIC

## 2021-09-20 SURGICAL SUPPLY — 11 items
CATARACT SUITE SIGHTPATH (MISCELLANEOUS) ×2 IMPLANT
FEE CATARACT SUITE SIGHTPATH (MISCELLANEOUS) ×1 IMPLANT
GLOVE SRG 8 PF TXTR STRL LF DI (GLOVE) ×1 IMPLANT
GLOVE SURG ENC TEXT LTX SZ7.5 (GLOVE) ×2 IMPLANT
GLOVE SURG UNDER POLY LF SZ8 (GLOVE) ×2
LENS IOL TECNIS EYHANCE 21.0 (Intraocular Lens) ×1 IMPLANT
NDL FILTER BLUNT 18X1 1/2 (NEEDLE) ×1 IMPLANT
NEEDLE FILTER BLUNT 18X 1/2SAF (NEEDLE) ×1
NEEDLE FILTER BLUNT 18X1 1/2 (NEEDLE) ×1 IMPLANT
SYR 3ML LL SCALE MARK (SYRINGE) ×2 IMPLANT
WATER STERILE IRR 250ML POUR (IV SOLUTION) ×2 IMPLANT

## 2021-09-20 NOTE — Anesthesia Preprocedure Evaluation (Signed)
Anesthesia Evaluation  ?Patient identified by MRN, date of birth, ID band ?Patient awake ? ? ? ?Reviewed: ?Allergy & Precautions, H&P , NPO status , Patient's Chart, lab work & pertinent test results ? ?Airway ?Mallampati: II ? ?TM Distance: >3 FB ?Neck ROM: full ? ? ? Dental ?no notable dental hx. ? ?  ?Pulmonary ? ?  ?Pulmonary exam normal ?breath sounds clear to auscultation ? ? ? ? ? ? Cardiovascular ?hypertension, Normal cardiovascular exam ?Rhythm:regular Rate:Normal ? ? ?  ?Neuro/Psych ?  ? GI/Hepatic ?  ?Endo/Other  ?Hypothyroidism  ? Renal/GU ?Renal disease  ? ?  ?Musculoskeletal ? ? Abdominal ?  ?Peds ? Hematology ?  ?Anesthesia Other Findings ? ? Reproductive/Obstetrics ? ?  ? ? ? ? ? ? ? ? ? ? ? ? ? ?  ?  ? ? ? ? ? ? ? ? ?Anesthesia Physical ?Anesthesia Plan ? ?ASA: 2 ? ?Anesthesia Plan: MAC  ? ?Post-op Pain Management: Minimal or no pain anticipated  ? ?Induction:  ? ?PONV Risk Score and Plan: 2 and Treatment may vary due to age or medical condition ? ?Airway Management Planned:  ? ?Additional Equipment:  ? ?Intra-op Plan:  ? ?Post-operative Plan:  ? ?Informed Consent: I have reviewed the patients History and Physical, chart, labs and discussed the procedure including the risks, benefits and alternatives for the proposed anesthesia with the patient or authorized representative who has indicated his/her understanding and acceptance.  ? ? ? ?Dental Advisory Given ? ?Plan Discussed with: CRNA ? ?Anesthesia Plan Comments:   ? ? ? ? ? ? ?Anesthesia Quick Evaluation ? ?

## 2021-09-20 NOTE — Anesthesia Postprocedure Evaluation (Signed)
Anesthesia Post Note ? ?Patient: Karla Little ? ?Procedure(s) Performed: CATARACT EXTRACTION PHACO AND INTRAOCULAR LENS PLACEMENT (IOC) LEFT (Left: Eye) ? ? ?  ?Patient location during evaluation: PACU ?Anesthesia Type: MAC ?Level of consciousness: awake and alert and oriented ?Pain management: satisfactory to patient ?Vital Signs Assessment: post-procedure vital signs reviewed and stable ?Respiratory status: spontaneous breathing, nonlabored ventilation and respiratory function stable ?Cardiovascular status: blood pressure returned to baseline and stable ?Postop Assessment: Adequate PO intake and No signs of nausea or vomiting ?Anesthetic complications: no ? ? ?No notable events documented. ? ?Raliegh Ip ? ? ? ? ? ?

## 2021-09-20 NOTE — Op Note (Signed)
OPERATIVE NOTE ? ?[name] ?[id number] ?09/20/2021 ? ? ?PREOPERATIVE DIAGNOSIS:  Nuclear sclerotic cataract left eye. H25.12 ?  ?POSTOPERATIVE DIAGNOSIS:    Nuclear sclerotic cataract left eye.   ?  ?PROCEDURE:  Phacoemusification with posterior chamber intraocular lens placement of the left eye  ?Ultrasound time: Procedure(s) with comments: ?CATARACT EXTRACTION PHACO AND INTRAOCULAR LENS PLACEMENT (IOC) LEFT (Left) - 21.54 ?01:50.0 ? ?LENS:   ?Implant Name Type Inv. Item Serial No. Manufacturer Lot No. LRB No. Used Action  ?LENS IOL TECNIS EYHANCE 21.0 - [id number] Intraocular Lens LENS IOL TECNIS EYHANCE 21.0 [id number] [employer]  Left 1 Implanted  ?   ? ?SURGEON:  Wyonia Hough, MD ?  ?ANESTHESIA:  Topical with tetracaine drops and 2% Xylocaine jelly, augmented with 1% preservative-free intracameral lidocaine. ? ?  ?COMPLICATIONS:  None. ?  ?DESCRIPTION OF PROCEDURE:  The patient was identified in the holding room and transported to the operating room and placed in the supine position under the operating microscope.  The left eye was identified as the operative eye and it was prepped and draped in the usual sterile ophthalmic fashion. ?  ?A 1 millimeter clear-corneal paracentesis was made at the 1:30 position.  0.5 ml of preservative-free 1% lidocaine was injected into the anterior chamber. ? The anterior chamber was filled with Viscoat viscoelastic.  A 2.4 millimeter keratome was used to make a near-clear corneal incision at the 10:30 position.  .  A curvilinear capsulorrhexis was made with a cystotome and capsulorrhexis forceps.  Balanced salt solution was used to hydrodissect and hydrodelineate the nucleus. ?  ?Phacoemulsification was then used in stop and chop fashion to remove the lens nucleus and epinucleus.  The remaining cortex was then removed using the irrigation and aspiration handpiece. Provisc was then placed into the capsular bag to distend it for lens placement.  A lens was then  injected into the capsular bag.  The remaining viscoelastic was aspirated. ?  ?Wounds were hydrated with balanced salt solution.  The anterior chamber was inflated to a physiologic pressure with balanced salt solution.  No wound leaks were noted. Vigamox 0.2 ml of a '1mg'$  per ml solution was injected into the anterior chamber for a dose of 0.2 mg of intracameral antibiotic at the completion of the case. ?  Timolol and Brimonidine drops were applied to the eye.  The patient was taken to the recovery room in stable condition without complications of anesthesia or surgery. ? ?Karla Little ?09/20/2021, 10:06 AM ? ?

## 2021-09-20 NOTE — H&P (Signed)
?[facility]  ? ?Primary Care Physician:  Charlynne Cousins, MD ?Ophthalmologist: Dr. Leandrew Koyanagi ? ?Pre-Procedure History & Physical: ?HPI:  Karla Little is a 88 y.o. female here for ophthalmic surgery. ?  ?Past Medical History:  ?Diagnosis Date  ? Chronic kidney disease   ? Hyperlipidemia   ? Hypertension   ? Hyponatremia   ? Hypothyroidism   ? Non-functioning kidney   ? SEES DR LATEEF  ? Osteoporosis   ? Renal insufficiency   ? Vertigo   ? ? ?Past Surgical History:  ?Procedure Laterality Date  ? BREAST BIOPSY    ? COLONOSCOPY W/ POLYPECTOMY    ? COLONOSCOPY WITH PROPOFOL N/A 06/02/2015  ? Procedure: COLONOSCOPY WITH PROPOFOL;  Surgeon: Manya Silvas, MD;  Location: [facility] ENDOSCOPY;  Service: Endoscopy;  Laterality: N/A;  ? ? ?Prior to Admission medications   ?Medication Sig Start Date End Date Taking? Authorizing Provider  ?amLODipine (NORVASC) 10 MG tablet TAKE 1 TABLET BY MOUTH EVERY DAY 08/14/21  Yes Vigg, Avanti, MD  ?calcitRIOL (ROCALTROL) 0.25 MCG capsule Take 0.25 mcg by mouth daily.  11/24/15  Yes [provider]  ?famotidine (PEPCID) 20 MG tablet Take 1 tablet (20 mg total) by mouth at bedtime. 07/12/21 09/14/21 Yes Vigg, Avanti, MD  ?hydrALAZINE (APRESOLINE) 25 MG tablet TAKE 1 TABLET (25 MG TOTAL) BY MOUTH IN THE MORNING AND AT BEDTIME. 09/13/21  Yes Vigg, Avanti, MD  ?levothyroxine (SYNTHROID) 50 MCG tablet TAKE 1 TABLET BY MOUTH EVERY DAY 03/16/21  Yes Vigg, Avanti, MD  ?lisinopril (ZESTRIL) 10 MG tablet TAKE 1 TABLET BY MOUTH EVERY DAY 01/11/21  Yes Vigg, Avanti, MD  ?sodium bicarbonate 650 MG tablet Take 650 mg by mouth 2 (two) times daily.   Yes [provider]  ?vitamin B-12 (CYANOCOBALAMIN) 250 MCG tablet Take 500 mcg by mouth daily.    Yes [provider]  ?Vitamin D3 (VITAMIN D) 25 MCG tablet Take 1,000 Units by mouth daily.   Yes [provider]  ? ? ?Allergies as of 08/30/2021 - Review Complete 07/12/2021  ?Allergen Reaction Noted  ?  Amoxicillin Rash 12/25/2017  ? Sod citrate-citric acid Other (See Comments) 05/19/2012  ? ? ?Family History  ?Problem Relation Age of Onset  ? Hypertension Mother   ? Heart disease Father   ? Breast cancer Sister   ? ? ?Social History  ? ?Socioeconomic History  ? Marital status: Married  ?  Spouse name: Not on file  ? Number of children: Not on file  ? Years of education: Not on file  ? Highest education level: Not on file  ?Occupational History  ? Not on file  ?Tobacco Use  ? Smoking status: Never  ?  Passive exposure: Never  ? Smokeless tobacco: Never  ?Vaping Use  ? Vaping Use: Never used  ?Substance and Sexual Activity  ? Alcohol use: No  ? Drug use: No  ? Sexual activity: Not on file  ?Other Topics Concern  ? Not on file  ?Social History Narrative  ? Not on file  ? ?Social Determinants of Health  ? ?Financial Resource Strain: Not on file  ?Food Insecurity: Not on file  ?Transportation Needs: Not on file  ?Physical Activity: Not on file  ?Stress: Not on file  ?Social Connections: Not on file  ?Intimate Partner Violence: Not on file  ? ? ?Review of Systems: ?See HPI, otherwise negative ROS ? ?Physical Exam: ?BP (!) 174/60   Pulse 81   Temp (!) 97.3 ?F (  36.3 ?C) (Temporal)   Resp 18   Ht '5\' 9"'$  (1.753 m)   Wt 71.7 kg   SpO2 99%   BMI 23.33 kg/m?  ?General:   Alert,  pleasant and cooperative in NAD ?Head:  Normocephalic and atraumatic. ?Lungs:  Clear to auscultation.    ?Heart:  Regular rate and rhythm.  ? ?Impression/Plan: ?Karla Little is here for ophthalmic surgery. ? ?Risks, benefits, limitations, and alternatives regarding ophthalmic surgery have been reviewed with the patient.  Questions have been answered.  All parties agreeable. ? ? Leandrew Koyanagi, MD  09/20/2021, 9:11 AM ? ?

## 2021-09-20 NOTE — Transfer of Care (Signed)
Immediate Anesthesia Transfer of Care Note ? ?Patient: Karla Little ? ?Procedure(s) Performed: CATARACT EXTRACTION PHACO AND INTRAOCULAR LENS PLACEMENT (IOC) LEFT (Left: Eye) ? ?Patient Location: PACU ? ?Anesthesia Type: MAC ? ?Level of Consciousness: awake, alert  and patient cooperative ? ?Airway and Oxygen Therapy: Patient Spontanous Breathing and Patient connected to supplemental oxygen ? ?Post-op Assessment: Post-op Vital signs reviewed, Patient's Cardiovascular Status Stable, Respiratory Function Stable, Patent Airway and No signs of Nausea or vomiting ? ?Post-op Vital Signs: Reviewed and stable ? ?Complications: No notable events documented. ? ?

## 2021-09-21 ENCOUNTER — Encounter: Payer: Self-pay | Admitting: Ophthalmology

## 2021-09-25 ENCOUNTER — Encounter: Payer: Self-pay | Admitting: Internal Medicine

## 2021-10-08 ENCOUNTER — Other Ambulatory Visit: Payer: Self-pay | Admitting: Internal Medicine

## 2021-10-09 ENCOUNTER — Ambulatory Visit: Payer: Medicare HMO | Admitting: Internal Medicine

## 2021-10-09 NOTE — Telephone Encounter (Signed)
Requested medications are due for refill today.  unsure ? ?Requested medications are on the active medications list.  yes ? ?Last refill. 07/12/2021 #30 2 refills  ? ?Future visit scheduled.   yes ? ?Notes to clinic.  Rx written to expire 09/14/2021 - Rx is expired. ? ? ? ?Requested Prescriptions  ?Pending Prescriptions Disp Refills  ? famotidine (PEPCID) 20 MG tablet [Pharmacy Med Name: FAMOTIDINE 20 MG TABLET] 90 tablet   ?  Sig: TAKE 1 TABLET BY MOUTH EVERYDAY AT BEDTIME  ?  ? Gastroenterology:  H2 Antagonists Passed - 10/08/2021  9:19 AM  ?  ?  Passed - Valid encounter within last 12 months  ?  Recent Outpatient Visits   ? ?      ? 2 months ago Hypothyroidism, unspecified type  ? [facility] Vigg, Avanti, MD  ? 7 months ago COVID-19  ? [facility] Vigg, Avanti, MD  ? 9 months ago Hypothyroidism, unspecified type  ? [facility] Vigg, Avanti, MD  ? 1 year ago Chronic kidney disease, stage 4, severely decreased GFR ([place])  ? [place], Henrine Screws T, NP  ? 1 year ago Routine general medical examination at a health care facility  ? [facility] [place], Henrine Screws T, NP  ? ?  ?  ?Future Appointments   ? ?        ? Tomorrow Charlynne Cousins, MD [facility]  ? ?  ? ? ?  ?  ?  ?  ?

## 2021-10-10 ENCOUNTER — Ambulatory Visit (INDEPENDENT_AMBULATORY_CARE_PROVIDER_SITE_OTHER): Payer: Medicare HMO | Admitting: Internal Medicine

## 2021-10-10 ENCOUNTER — Encounter: Payer: Self-pay | Admitting: Internal Medicine

## 2021-10-10 VITALS — BP 154/76 | HR 64 | Temp 98.4°F | Ht 67.32 in | Wt 164.4 lb

## 2021-10-10 DIAGNOSIS — I700 Atherosclerosis of aorta: Secondary | ICD-10-CM

## 2021-10-10 DIAGNOSIS — E785 Hyperlipidemia, unspecified: Secondary | ICD-10-CM | POA: Diagnosis not present

## 2021-10-10 DIAGNOSIS — N184 Chronic kidney disease, stage 4 (severe): Secondary | ICD-10-CM

## 2021-10-10 DIAGNOSIS — I10 Essential (primary) hypertension: Secondary | ICD-10-CM

## 2021-10-10 MED ORDER — HYDRALAZINE HCL 25 MG PO TABS: 25.0000 mg | ORAL_TABLET | Freq: Three times a day (TID) | ORAL | 1 refills | Status: DC

## 2021-10-10 MED ORDER — ROSUVASTATIN CALCIUM 10 MG PO TABS: 10.0000 mg | ORAL_TABLET | Freq: Every day | ORAL | 3 refills | Status: DC

## 2021-10-10 NOTE — Progress Notes (Signed)
? ?BP (!) 154/76   Pulse 64   Temp 98.4 ?F (36.9 ?C) (Oral)   Ht 5' 7.32" (1.71 m)   Wt 164 lb 6.4 oz (74.6 kg)   SpO2 98%   BMI 25.50 kg/m?   ? ?Subjective:  ? ? Patient ID: Karla Little, female    DOB: 1933/10/25, 88 y.o.   MRN: [id number] ? ?Chief Complaint  ?Patient presents with  ? Hypertension  ? Hyperlipidemia  ? Hypothyroidism  ? ? ?HPI: ?Karla Little is a 88 y.o. female ? ?Hypertension ?This is a chronic problem. The problem is uncontrolled. Pertinent negatives include no anxiety, blurred vision, chest pain, headaches, malaise/fatigue, neck pain, orthopnea, palpitations, peripheral edema, PND, shortness of breath or sweats.  ?Hyperlipidemia ?This is a chronic problem. The problem is uncontrolled. Factors aggravating her hyperlipidemia include fatty foods. Pertinent negatives include no chest pain or shortness of breath.  ? ?Chief Complaint  ?Patient presents with  ? Hypertension  ? Hyperlipidemia  ? Hypothyroidism  ? ? ?Relevant past medical, surgical, family and social history reviewed and updated as indicated. Interim medical history since our last visit reviewed. ?Allergies and medications reviewed and updated. ? ?Review of Systems  ?Constitutional:  Negative for activity change, appetite change, chills, fatigue, fever and malaise/fatigue.  ?HENT:  Negative for congestion.   ?Eyes:  Negative for blurred vision and visual disturbance.  ?Respiratory:  Negative for apnea, cough, chest tightness, shortness of breath and wheezing.   ?Cardiovascular:  Negative for chest pain, palpitations, orthopnea, leg swelling and PND.  ?Gastrointestinal:  Negative for abdominal distention, abdominal pain, diarrhea and nausea.  ?Endocrine: Negative for cold intolerance, heat intolerance, polydipsia, polyphagia and polyuria.  ?Genitourinary:  Negative for difficulty urinating, frequency, hematuria and urgency.  ?Musculoskeletal:  Negative for neck pain.  ?Skin:  Negative for color change and rash.   ?Neurological:  Negative for dizziness, speech difficulty, weakness, light-headedness, numbness and headaches.  ?Psychiatric/Behavioral:  Negative for behavioral problems and confusion. The patient is not nervous/anxious.   ? ?Per HPI unless specifically indicated above ? ?   ?Objective:  ?  ?BP (!) 154/76   Pulse 64   Temp 98.4 ?F (36.9 ?C) (Oral)   Ht 5' 7.32" (1.71 m)   Wt 164 lb 6.4 oz (74.6 kg)   SpO2 98%   BMI 25.50 kg/m?   ?Wt Readings from Last 3 Encounters:  ?10/10/21 164 lb 6.4 oz (74.6 kg)  ?09/20/21 158 lb (71.7 kg)  ?07/12/21 163 lb (73.9 kg)  ?  ?Physical Exam ?Vitals and nursing note reviewed.  ?Constitutional:   ?   General: She is not in acute distress. ?   Appearance: Normal appearance. She is not ill-appearing or diaphoretic.  ?Eyes:  ?   Conjunctiva/sclera: Conjunctivae normal.  ?Pulmonary:  ?   Breath sounds: No rhonchi.  ?Abdominal:  ?   General: Abdomen is flat. Bowel sounds are normal. There is no distension.  ?   Palpations: Abdomen is soft. There is no mass.  ?   Tenderness: There is no abdominal tenderness. There is no guarding.  ?Skin: ?   General: Skin is warm and dry.  ?   Coloration: Skin is not jaundiced.  ?   Findings: No erythema.  ?Neurological:  ?   Mental Status: She is alert.  ? ? ?Results for orders placed or performed in visit on 07/05/21  ?Lipid panel  ?Result Value Ref Range  ? Cholesterol, Total 247 (H) 100 - 199 mg/dL  ? Triglycerides  147 0 - 149 mg/dL  ? HDL 75 >39 mg/dL  ? VLDL Cholesterol Cal 26 5 - 40 mg/dL  ? LDL Chol Calc (NIH) 146 (H) 0 - 99 mg/dL  ? Chol/HDL Ratio 3.3 0.0 - 4.4 ratio  ?CBC with Differential/Platelet  ?Result Value Ref Range  ? WBC 7.2 3.4 - 10.8 x10E3/uL  ? RBC 4.35 3.77 - 5.28 x10E6/uL  ? Hemoglobin 13.5 11.1 - 15.9 g/dL  ? Hematocrit 41.2 34.0 - 46.6 %  ? MCV 95 79 - 97 fL  ? MCH 31.0 26.6 - 33.0 pg  ? MCHC 32.8 31.5 - 35.7 g/dL  ? RDW 12.4 11.7 - 15.4 %  ? Platelets 227 150 - 450 x10E3/uL  ? Neutrophils 74 Not Estab. %  ? Lymphs 15 Not  Estab. %  ? Monocytes 8 Not Estab. %  ? Eos 2 Not Estab. %  ? Basos 1 Not Estab. %  ? Neutrophils Absolute 5.3 1.4 - 7.0 x10E3/uL  ? Lymphocytes Absolute 1.1 0.7 - 3.1 x10E3/uL  ? Monocytes Absolute 0.6 0.1 - 0.9 x10E3/uL  ? EOS (ABSOLUTE) 0.1 0.0 - 0.4 x10E3/uL  ? Basophils Absolute 0.0 0.0 - 0.2 x10E3/uL  ? Immature Granulocytes 0 Not Estab. %  ? Immature Grans (Abs) 0.0 0.0 - 0.1 x10E3/uL  ?CMP14+EGFR  ?Result Value Ref Range  ? Glucose 100 (H) 70 - 99 mg/dL  ? BUN 30 (H) 8 - 27 mg/dL  ? Creatinine, Ser 2.01 (H) 0.57 - 1.00 mg/dL  ? eGFR 24 (L) >59 mL/min/1.73  ? BUN/Creatinine Ratio 15 12 - 28  ? Sodium 138 134 - 144 mmol/L  ? Potassium 4.8 3.5 - 5.2 mmol/L  ? Chloride 101 96 - 106 mmol/L  ? CO2 21 20 - 29 mmol/L  ? Calcium 10.0 8.7 - 10.3 mg/dL  ? Total Protein 6.7 6.0 - 8.5 g/dL  ? Albumin 4.3 3.6 - 4.6 g/dL  ? Globulin, Total 2.4 1.5 - 4.5 g/dL  ? Albumin/Globulin Ratio 1.8 1.2 - 2.2  ? Bilirubin Total 0.6 0.0 - 1.2 mg/dL  ? Alkaline Phosphatase 72 44 - 121 IU/L  ? AST 11 0 - 40 IU/L  ? ALT 9 0 - 32 IU/L  ?Urinalysis, Routine w reflex microscopic  ?Result Value Ref Range  ? Specific Gravity, UA 1.010 1.005 - 1.030  ? pH, UA 6.5 5.0 - 7.5  ? Color, UA Yellow Yellow  ? Appearance Ur Clear Clear  ? Leukocytes,UA Negative Negative  ? Protein,UA Negative Negative/Trace  ? Glucose, UA Negative Negative  ? Ketones, UA Negative Negative  ? RBC, UA Negative Negative  ? Bilirubin, UA Negative Negative  ? Urobilinogen, Ur 0.2 0.2 - 1.0 mg/dL  ? Nitrite, UA Negative Negative  ? ?   ? ? ?Current Outpatient Medications:  ?  rosuvastatin (CRESTOR) 10 MG tablet, Take 1 tablet (10 mg total) by mouth daily., Disp: 90 tablet, Rfl: 3 ?  amLODipine (NORVASC) 10 MG tablet, TAKE 1 TABLET BY MOUTH EVERY DAY, Disp: 90 tablet, Rfl: 1 ?  calcitRIOL (ROCALTROL) 0.25 MCG capsule, Take 0.25 mcg by mouth daily. , Disp: , Rfl:  ?  famotidine (PEPCID) 20 MG tablet, TAKE 1 TABLET BY MOUTH EVERYDAY AT BEDTIME, Disp: 90 tablet, Rfl: 1 ?   hydrALAZINE (APRESOLINE) 25 MG tablet, Take 1 tablet (25 mg total) by mouth 3 (three) times daily., Disp: 180 tablet, Rfl: 1 ?  levothyroxine (SYNTHROID) 50 MCG tablet, TAKE 1 TABLET BY MOUTH EVERY DAY, Disp: 90 tablet,  Rfl: 3 ?  lisinopril (ZESTRIL) 10 MG tablet, TAKE 1 TABLET BY MOUTH EVERY DAY, Disp: 90 tablet, Rfl: 4 ?  sodium bicarbonate 650 MG tablet, Take 650 mg by mouth 2 (two) times daily., Disp: , Rfl:  ?  vitamin B-12 (CYANOCOBALAMIN) 250 MCG tablet, Take 500 mcg by mouth daily. , Disp: , Rfl:  ?  Vitamin D3 (VITAMIN D) 25 MCG tablet, Take 1,000 Units by mouth daily., Disp: , Rfl:   ? ? ?Assessment & Plan:  ?CKD :  ?Sees nephrology for such  ? Latest Reference Range & Units 05/18/21 21:12 07/05/21 08:27  ?BUN 8 - 27 mg/dL 37 (H) 30 (H)  ?Creatinine 0.57 - 1.00 mg/dL 1.77 (H) 2.01 (H)  ?(H): Data is abnormally high ? ?2. HLD will start pt on crestor for such ?recheck FLP, check LFT's work on diet, SE of meds explained to pt. low fat and high fiber diet explained to pt. ? ?3. HTN - increase hydralazine 25 mg tid  ?Continue current meds.  Medication compliance emphasised. pt advised to keep Bp logs. Pt verbalised understanding of the same. Pt to have a low salt diet . Exercise to reach a goal of at least 150 mins a week.  lifestyle modifications explained and pt understands importance of the above. ?Under good control on current regimen. Continue current regimen. Continue to monitor. Call with any concerns. Refills given. Labs drawn today. ? ? ? ?Problem List Items Addressed This Visit   ? ?  ? Cardiovascular and Mediastinum  ? Essential hypertension  ? Relevant Medications  ? hydrALAZINE (APRESOLINE) 25 MG tablet  ? rosuvastatin (CRESTOR) 10 MG tablet  ? Aortic atherosclerosis ([place])  ? Relevant Medications  ? hydrALAZINE (APRESOLINE) 25 MG tablet  ? rosuvastatin (CRESTOR) 10 MG tablet  ?  ? Genitourinary  ? Chronic kidney disease, stage 4, severely decreased GFR (HCC)  ?  ? Other  ? Hyperlipidemia - Primary   ? Relevant Medications  ? hydrALAZINE (APRESOLINE) 25 MG tablet  ? rosuvastatin (CRESTOR) 10 MG tablet  ?  ? ?No orders of the defined types were placed in this encounter. ?  ? ?Meds ordered this encounter  ?Medications  ? hydrALAZ

## 2021-10-13 DIAGNOSIS — Z961 Presence of intraocular lens: Secondary | ICD-10-CM | POA: Diagnosis not present

## 2021-10-24 DIAGNOSIS — N184 Chronic kidney disease, stage 4 (severe): Secondary | ICD-10-CM | POA: Diagnosis not present

## 2021-10-24 DIAGNOSIS — R809 Proteinuria, unspecified: Secondary | ICD-10-CM | POA: Diagnosis not present

## 2021-10-24 DIAGNOSIS — I10 Essential (primary) hypertension: Secondary | ICD-10-CM | POA: Diagnosis not present

## 2021-10-24 DIAGNOSIS — N2581 Secondary hyperparathyroidism of renal origin: Secondary | ICD-10-CM | POA: Diagnosis not present

## 2021-10-24 DIAGNOSIS — E871 Hypo-osmolality and hyponatremia: Secondary | ICD-10-CM | POA: Diagnosis not present

## 2021-10-24 DIAGNOSIS — E8722 Chronic metabolic acidosis: Secondary | ICD-10-CM | POA: Diagnosis not present

## 2022-01-04 ENCOUNTER — Encounter: Payer: Self-pay | Admitting: Family

## 2022-01-10 ENCOUNTER — Other Ambulatory Visit: Payer: Self-pay

## 2022-01-10 DIAGNOSIS — N184 Chronic kidney disease, stage 4 (severe): Secondary | ICD-10-CM

## 2022-01-10 DIAGNOSIS — I10 Essential (primary) hypertension: Secondary | ICD-10-CM

## 2022-01-10 MED ORDER — LISINOPRIL 10 MG PO TABS: 10.0000 mg | ORAL_TABLET | Freq: Every day | ORAL | 0 refills | Status: DC

## 2022-01-10 NOTE — Telephone Encounter (Signed)
LOV 10/10/21  Future appt 02/08/22 with Malachy Mood

## 2022-01-16 ENCOUNTER — Ambulatory Visit: Payer: Medicare HMO | Admitting: Unknown Physician Specialty

## 2022-01-16 DIAGNOSIS — K08404 Partial loss of teeth, unspecified cause, class IV: Secondary | ICD-10-CM | POA: Diagnosis not present

## 2022-01-18 DIAGNOSIS — N184 Chronic kidney disease, stage 4 (severe): Secondary | ICD-10-CM | POA: Diagnosis not present

## 2022-01-24 DIAGNOSIS — N2581 Secondary hyperparathyroidism of renal origin: Secondary | ICD-10-CM | POA: Diagnosis not present

## 2022-01-24 DIAGNOSIS — N184 Chronic kidney disease, stage 4 (severe): Secondary | ICD-10-CM | POA: Diagnosis not present

## 2022-01-24 DIAGNOSIS — I10 Essential (primary) hypertension: Secondary | ICD-10-CM | POA: Diagnosis not present

## 2022-01-24 DIAGNOSIS — E8722 Chronic metabolic acidosis: Secondary | ICD-10-CM | POA: Diagnosis not present

## 2022-01-24 DIAGNOSIS — R809 Proteinuria, unspecified: Secondary | ICD-10-CM | POA: Diagnosis not present

## 2022-02-04 ENCOUNTER — Other Ambulatory Visit: Payer: Self-pay | Admitting: Nurse Practitioner

## 2022-02-04 DIAGNOSIS — N184 Chronic kidney disease, stage 4 (severe): Secondary | ICD-10-CM

## 2022-02-04 DIAGNOSIS — I10 Essential (primary) hypertension: Secondary | ICD-10-CM

## 2022-02-06 NOTE — Telephone Encounter (Signed)
Requested medications are due for refill today.  yes  Requested medications are on the active medications list.  yes  Last refill. 01/10/2022 #30 0 refills  Future visit scheduled.   yes  Notes to clinic.  No PCP listed.    Requested Prescriptions  Pending Prescriptions Disp Refills   lisinopril (ZESTRIL) 10 MG tablet [Pharmacy Med Name: LISINOPRIL 10 MG TABLET] 30 tablet 0    Sig: TAKE 1 TABLET BY MOUTH EVERY DAY     Cardiovascular:  ACE Inhibitors Failed - 02/04/2022  8:32 AM      Failed - Cr in normal range and within 180 days    Creatinine, Ser  Date Value Ref Range Status  07/05/2021 2.01 (H) 0.57 - 1.00 mg/dL Final         Failed - K in normal range and within 180 days    Potassium  Date Value Ref Range Status  07/05/2021 4.8 3.5 - 5.2 mmol/L Final         Failed - Last BP in normal range    BP Readings from Last 1 Encounters:  10/10/21 (!) 154/76         Passed - Patient is not pregnant      Passed - Valid encounter within last 6 months    Recent Outpatient Visits           3 months ago Hyperlipidemia, unspecified hyperlipidemia type   [facility] Vigg, Avanti, MD   6 months ago Hypothyroidism, unspecified type   [facility] Vigg, Avanti, MD   11 months ago COVID-24   [employer], Customer service manager, MD   1 year ago Hypothyroidism, unspecified type   [facility] Vigg, Avanti, MD   1 year ago Chronic kidney disease, stage 4, severely decreased GFR ([place])   [place], Barbaraann Faster, NP       Future Appointments             In 2 days Kathrine Haddock, NP [facility], [place]

## 2022-02-08 ENCOUNTER — Encounter: Payer: Self-pay | Admitting: Unknown Physician Specialty

## 2022-02-08 ENCOUNTER — Ambulatory Visit (INDEPENDENT_AMBULATORY_CARE_PROVIDER_SITE_OTHER): Payer: Medicare HMO | Admitting: Unknown Physician Specialty

## 2022-02-08 VITALS — BP 137/73 | HR 61 | Temp 97.9°F | Ht 67.32 in | Wt 166.4 lb

## 2022-02-08 DIAGNOSIS — I10 Essential (primary) hypertension: Secondary | ICD-10-CM | POA: Diagnosis not present

## 2022-02-08 DIAGNOSIS — R051 Acute cough: Secondary | ICD-10-CM | POA: Diagnosis not present

## 2022-02-08 DIAGNOSIS — E785 Hyperlipidemia, unspecified: Secondary | ICD-10-CM

## 2022-02-08 DIAGNOSIS — E039 Hypothyroidism, unspecified: Secondary | ICD-10-CM

## 2022-02-08 DIAGNOSIS — N184 Chronic kidney disease, stage 4 (severe): Secondary | ICD-10-CM

## 2022-02-08 MED ORDER — AMLODIPINE BESYLATE 10 MG PO TABS: 10.0000 mg | ORAL_TABLET | Freq: Every day | ORAL | 1 refills | Status: DC

## 2022-02-08 MED ORDER — LEVOTHYROXINE SODIUM 50 MCG PO TABS: 50.0000 ug | ORAL_TABLET | Freq: Every day | ORAL | 3 refills | Status: DC

## 2022-02-08 MED ORDER — LISINOPRIL 10 MG PO TABS: 10.0000 mg | ORAL_TABLET | Freq: Every day | ORAL | 2 refills | Status: DC

## 2022-02-08 NOTE — Progress Notes (Signed)
BP 137/73   Pulse 61   Temp 97.9 F (36.6 C) (Oral)   Ht 5' 7.32" (1.71 m)   Wt 166 lb 6.4 oz (75.5 kg)   SpO2 97%   BMI 25.81 kg/m    Subjective:    Patient ID: Karla Little, female    DOB: Mar 07, 1934, 88 y.o.   MRN: [id number]  HPI: Karla Little is a 88 y.o. female  Chief Complaint  Patient presents with   Hypertension   Hyperlipidemia   Hypothyroidism    Last TSH lab was 05/18/21   Pt is here to f/u of chronic conditions.  Chief concern is congestionin chest that she coughs in the AM.  Can hear it "rattling"  It resolves through the day.  Denies SOB.    Hypertension Using medications without difficulty Average home BPs Not checking  No problems or lightheadedness No chest pain with exertion or shortness of breath No Edema   Hyperlipidemia Using medications without problems: No Muscle aches  Diet compliance:Exercise: Walks around the house and up and down steps  Has a nephrologist and chemistry levels done there  Last TSH was 6.4.  On 50 mcgs of Synthroid     02/08/2022    3:15 PM 10/10/2021    1:22 PM 02/13/2021    5:44 PM 01/09/2021    1:13 PM 01/08/2020    1:37 PM  Depression screen PHQ 2/9  Decreased Interest 0 0 0 0 0  Down, Depressed, Hopeless 0 0 0 0 0  PHQ - 2 Score 0 0 0 0 0  Altered sleeping 0 0 0 0   Tired, decreased energy 0 0 0 0   Change in appetite 0 0 0 0   Feeling bad or failure about yourself  0 0 0 0   Trouble concentrating 0 0 0 0   Moving slowly or fidgety/restless 0 0 0 0   Suicidal thoughts 0 0 0 0   PHQ-9 Score 0 0 0 0   Difficult doing work/chores Not difficult at all Not difficult at all       jn  Relevant past medical, surgical, family and social history reviewed and updated as indicated. Interim medical history since our last visit reviewed. Allergies and medications reviewed and updated.  Review of Systems  Per HPI unless specifically indicated above     Objective:    BP 137/73   Pulse 61   Temp 97.9 F  (36.6 C) (Oral)   Ht 5' 7.32" (1.71 m)   Wt 166 lb 6.4 oz (75.5 kg)   SpO2 97%   BMI 25.81 kg/m   Wt Readings from Last 3 Encounters:  02/08/22 166 lb 6.4 oz (75.5 kg)  10/10/21 164 lb 6.4 oz (74.6 kg)  09/20/21 158 lb (71.7 kg)    Physical Exam Constitutional:      General: She is not in acute distress.    Appearance: Normal appearance. She is well-developed.  HENT:     Head: Normocephalic and atraumatic.  Eyes:     General: Lids are normal. No scleral icterus.       Right eye: No discharge.        Left eye: No discharge.     Conjunctiva/sclera: Conjunctivae normal.  Neck:     Vascular: No carotid bruit or JVD.  Cardiovascular:     Rate and Rhythm: Normal rate and regular rhythm.     Heart sounds: Normal heart sounds.  Pulmonary:     Effort:  Pulmonary effort is normal. No respiratory distress.     Breath sounds: Normal breath sounds.  Abdominal:     Palpations: There is no hepatomegaly or splenomegaly.  Musculoskeletal:        General: Normal range of motion.     Cervical back: Normal range of motion and neck supple.  Skin:    General: Skin is warm and dry.     Coloration: Skin is not pale.     Findings: No rash.  Neurological:     Mental Status: She is alert and oriented to person, place, and time.  Psychiatric:        Behavior: Behavior normal.        Thought Content: Thought content normal.        Judgment: Judgment normal.     Results for orders placed or performed in visit on 07/05/21  Lipid panel  Result Value Ref Range   Cholesterol, Total 247 (H) 100 - 199 mg/dL   Triglycerides 147 0 - 149 mg/dL   HDL 75 >39 mg/dL   VLDL Cholesterol Cal 26 5 - 40 mg/dL   LDL Chol Calc (NIH) 146 (H) 0 - 99 mg/dL   Chol/HDL Ratio 3.3 0.0 - 4.4 ratio  CBC with Differential/Platelet  Result Value Ref Range   WBC 7.2 3.4 - 10.8 x10E3/uL   RBC 4.35 3.77 - 5.28 x10E6/uL   Hemoglobin 13.5 11.1 - 15.9 g/dL   Hematocrit 41.2 34.0 - 46.6 %   MCV 95 79 - 97 fL   MCH 31.0  26.6 - 33.0 pg   MCHC 32.8 31.5 - 35.7 g/dL   RDW 12.4 11.7 - 15.4 %   Platelets 227 150 - 450 x10E3/uL   Neutrophils 74 Not Estab. %   Lymphs 15 Not Estab. %   Monocytes 8 Not Estab. %   Eos 2 Not Estab. %   Basos 1 Not Estab. %   Neutrophils Absolute 5.3 1.4 - 7.0 x10E3/uL   Lymphocytes Absolute 1.1 0.7 - 3.1 x10E3/uL   Monocytes Absolute 0.6 0.1 - 0.9 x10E3/uL   EOS (ABSOLUTE) 0.1 0.0 - 0.4 x10E3/uL   Basophils Absolute 0.0 0.0 - 0.2 x10E3/uL   Immature Granulocytes 0 Not Estab. %   Immature Grans (Abs) 0.0 0.0 - 0.1 x10E3/uL  CMP14+EGFR  Result Value Ref Range   Glucose 100 (H) 70 - 99 mg/dL   BUN 30 (H) 8 - 27 mg/dL   Creatinine, Ser 2.01 (H) 0.57 - 1.00 mg/dL   eGFR 24 (L) >59 mL/min/1.73   BUN/Creatinine Ratio 15 12 - 28   Sodium 138 134 - 144 mmol/L   Potassium 4.8 3.5 - 5.2 mmol/L   Chloride 101 96 - 106 mmol/L   CO2 21 20 - 29 mmol/L   Calcium 10.0 8.7 - 10.3 mg/dL   Total Protein 6.7 6.0 - 8.5 g/dL   Albumin 4.3 3.6 - 4.6 g/dL   Globulin, Total 2.4 1.5 - 4.5 g/dL   Albumin/Globulin Ratio 1.8 1.2 - 2.2   Bilirubin Total 0.6 0.0 - 1.2 mg/dL   Alkaline Phosphatase 72 44 - 121 IU/L   AST 11 0 - 40 IU/L   ALT 9 0 - 32 IU/L  Urinalysis, Routine w reflex microscopic  Result Value Ref Range   Specific Gravity, UA 1.010 1.005 - 1.030   pH, UA 6.5 5.0 - 7.5   Color, UA Yellow Yellow   Appearance Ur Clear Clear   Leukocytes,UA Negative Negative   Protein,UA Negative Negative/Trace  Glucose, UA Negative Negative   Ketones, UA Negative Negative   RBC, UA Negative Negative   Bilirubin, UA Negative Negative   Urobilinogen, Ur 0.2 0.2 - 1.0 mg/dL   Nitrite, UA Negative Negative      Assessment & Plan:   Problem List Items Addressed This Visit       Unprioritized   Chronic kidney disease, stage 4, severely decreased GFR (HCC)   Relevant Medications   amLODipine (NORVASC) 10 MG tablet   lisinopril (ZESTRIL) 10 MG tablet   Essential hypertension    Stable,  continue present medications.        Relevant Medications   amLODipine (NORVASC) 10 MG tablet   lisinopril (ZESTRIL) 10 MG tablet   Hyperlipidemia    LDL is elevated however statin intolerant.  Last rx was for Rosuvastatin which she is not taking      Relevant Medications   amLODipine (NORVASC) 10 MG tablet   lisinopril (ZESTRIL) 10 MG tablet   Hypothyroidism    Will check TSH and T4 today.        Relevant Medications   levothyroxine (SYNTHROID) 50 MCG tablet   Other Relevant Orders   TSH   T4, free   Other Visit Diagnoses     Acute cough    -  Primary   for about 2-3 weeks.  Suspect allergies. Will RTC if continues past another 2-3 weeks or gets worse        Follow up plan: Return in about 6 months (around 08/09/2022).

## 2022-02-08 NOTE — Assessment & Plan Note (Signed)
LDL is elevated however statin intolerant.  Last rx was for Rosuvastatin which she is not taking

## 2022-02-08 NOTE — Assessment & Plan Note (Signed)
Will check TSH and T4 today.

## 2022-02-08 NOTE — Assessment & Plan Note (Signed)
Stable, continue present medications.   

## 2022-02-09 LAB — TSH: TSH: 2.34 u[IU]/mL (ref 0.450–4.500)

## 2022-02-09 LAB — T4, FREE: Free T4: 1.29 ng/dL (ref 0.82–1.77)

## 2022-02-13 ENCOUNTER — Telehealth: Payer: Self-pay | Admitting: Family

## 2022-02-13 NOTE — Telephone Encounter (Signed)
Copied from [place] [phone]. Topic: Medicare AWV >> Feb 13, 2022  2:03 PM Josephina Gip wrote: Reason for CRM:  Left message for patient to call back and schedule the Medicare Annual Wellness Visit (AWV) virtually or by telephone.  Last AWV 02/13/21  Please schedule at anytime with [facility] Health Advisor.   Any questions, please call me at [phone]

## 2022-03-02 ENCOUNTER — Other Ambulatory Visit: Payer: Self-pay

## 2022-03-02 DIAGNOSIS — I10 Essential (primary) hypertension: Secondary | ICD-10-CM

## 2022-03-02 MED ORDER — HYDRALAZINE HCL 25 MG PO TABS: 25.0000 mg | ORAL_TABLET | Freq: Three times a day (TID) | ORAL | 2 refills | Status: DC

## 2022-03-02 NOTE — Telephone Encounter (Signed)
Medication refill for Hydralazine 25 mg last ov 02/08/22, upcoming ov no future appt . Please advise

## 2022-03-05 NOTE — Telephone Encounter (Signed)
LVM asking patient to call back to schedule an appointment 

## 2022-04-09 ENCOUNTER — Other Ambulatory Visit: Payer: Self-pay

## 2022-04-09 MED ORDER — FAMOTIDINE 20 MG PO TABS: ORAL_TABLET | ORAL | 1 refills | Status: DC

## 2022-04-09 NOTE — Telephone Encounter (Signed)
Medication refill for Famotidine '20mg'$   last ov 02/08/22, upcoming ov 08/08/21 . Please advise

## 2022-04-23 DIAGNOSIS — H40053 Ocular hypertension, bilateral: Secondary | ICD-10-CM | POA: Diagnosis not present

## 2022-05-02 DIAGNOSIS — N184 Chronic kidney disease, stage 4 (severe): Secondary | ICD-10-CM | POA: Diagnosis not present

## 2022-05-02 DIAGNOSIS — I10 Essential (primary) hypertension: Secondary | ICD-10-CM | POA: Diagnosis not present

## 2022-05-02 DIAGNOSIS — E8722 Chronic metabolic acidosis: Secondary | ICD-10-CM | POA: Diagnosis not present

## 2022-05-02 DIAGNOSIS — N2581 Secondary hyperparathyroidism of renal origin: Secondary | ICD-10-CM | POA: Diagnosis not present

## 2022-05-02 DIAGNOSIS — R809 Proteinuria, unspecified: Secondary | ICD-10-CM | POA: Diagnosis not present

## 2022-06-26 ENCOUNTER — Encounter: Payer: Self-pay | Admitting: Family Medicine

## 2022-06-26 ENCOUNTER — Ambulatory Visit (INDEPENDENT_AMBULATORY_CARE_PROVIDER_SITE_OTHER): Payer: Medicare HMO | Admitting: Family Medicine

## 2022-06-26 DIAGNOSIS — Z0000 Encounter for general adult medical examination without abnormal findings: Secondary | ICD-10-CM

## 2022-06-26 NOTE — Patient Instructions (Signed)
It was great to speak with you today!  Our plans for today:  - Check with your pharmacy about getting your updated COVID and tetanus boosters.  - It is recommended to get a shingles vaccine to prevent shingles outbreaks and is very effective at preventing outbreaks. This is a 2 dose series given 2 to 6 months apart. You can get these at your pharmacy.  - You are due for your updated pneumonia shot. You can get this at the pharmacy or at the [place] office at your next visit. - I recommend talking with trusted family members and/or friends about what you would or would not want done medically in an emergency situation if you were unable to speak for yourself. If you decide to put anything in writing, let us know so we can have a copy on your chart.   Take care and seek immediate care sooner if you develop any concerns.   Dr. Ky Barban  Things to do to keep yourself healthy  - Exercise at least 30-45 minutes a day, 3-4 days a week.  - Eat a low-fat diet with lots of fruits and vegetables, up to 7-9 servings per day.  - Seatbelts can save your life. Wear them always.  - Smoke detectors on every level of your home, check batteries every year.  - Eye Doctor - have an eye exam every 1-2 years  - Safe sex - if you may be exposed to STDs, use a condom.  - Alcohol -  If you drink, do it moderately, less than 2 drinks per day.  - [place]. Choose someone to speak for you if you are not able. https://www.prepareforyourcare.org is a great website to help you navigate this. - Depression is common in our stressful world.If you're feeling down or losing interest in things you normally enjoy, please come in for a visit.  - Violence - If anyone is threatening or hurting you, please call immediately.

## 2022-06-26 NOTE — Progress Notes (Addendum)
Annual Wellness Visit Virtual Telephone Visit, Audio only    Patient: Karla Little, Female    DOB: [birth year], 89 y.o.   MRN: [id number]  Subjective  No chief complaint on file.   Karla Little is a 89 y.o. female who presents today for her Annual Wellness Visit. She reports consuming a general diet. Home exercise routine includes walking around home. She generally feels well. She reports sleeping well. She does not have additional problems to discuss today.   Vision:Within last year and Dental: No current dental problems and Receives regular dental care   Patient Active Problem List   Diagnosis Date Noted   Aortic atherosclerosis ([place]) 07/08/2020   Secondary hyperparathyroidism of renal origin ([place]) 06/10/2019   Advanced care planning/counseling discussion 06/24/2017   Hypothyroidism 05/05/2015   Hyperlipidemia 05/05/2015   Atrophy of right kidney 03/16/2015   Chronic kidney disease, stage 4, severely decreased GFR ([place]) 03/16/2015   Shoulder bursitis 03/16/2015   Essential hypertension    Past Surgical History:  Procedure Laterality Date   BREAST BIOPSY     CATARACT EXTRACTION W/PHACO Left 09/20/2021   Procedure: CATARACT EXTRACTION PHACO AND INTRAOCULAR LENS PLACEMENT ([place]) LEFT;  Surgeon: Leandrew Koyanagi, MD;  Location: [facility];  Service: Ophthalmology;  Laterality: Left;  21.54 01:50.0   COLONOSCOPY W/ POLYPECTOMY     COLONOSCOPY WITH PROPOFOL N/A 06/02/2015   Procedure: COLONOSCOPY WITH PROPOFOL;  Surgeon: Manya Silvas, MD;  Location: [facility] ENDOSCOPY;  Service: Endoscopy;  Laterality: N/A;   Social History   Tobacco Use   Smoking status: Never    Passive exposure: Never   Smokeless tobacco: Never  Vaping Use   Vaping Use: Never used  Substance Use Topics   Alcohol use: No   Drug use: No      Medications: Outpatient Medications Prior to Visit  Medication Sig   amLODipine (NORVASC) 10 MG tablet Take 1 tablet (10 mg total) by  mouth daily.   calcitRIOL (ROCALTROL) 0.25 MCG capsule Take 0.25 mcg by mouth daily.    famotidine (PEPCID) 20 MG tablet TAKE 1 TABLET BY MOUTH EVERYDAY AT BEDTIME   hydrALAZINE (APRESOLINE) 25 MG tablet Take 1 tablet (25 mg total) by mouth 3 (three) times daily.   levothyroxine (SYNTHROID) 50 MCG tablet Take 1 tablet (50 mcg total) by mouth daily.   lisinopril (ZESTRIL) 10 MG tablet Take 1 tablet (10 mg total) by mouth daily.   sodium bicarbonate 650 MG tablet Take 650 mg by mouth 2 (two) times daily.   vitamin B-12 (CYANOCOBALAMIN) 250 MCG tablet Take 500 mcg by mouth daily.    Vitamin D3 (VITAMIN D) 25 MCG tablet Take 1,000 Units by mouth daily.   No facility-administered medications prior to visit.    Allergies  Allergen Reactions   Amoxicillin Rash    Did it involve swelling of the face/tongue/throat, SOB, or low BP? Yes Did it involve sudden or severe rash/hives, skin peeling, or any reaction on the inside of your mouth or nose? No Did you need to seek medical attention at a hospital or doctor's office? No When did it last happen?      07/2018 If all above answers are "NO", may proceed with cephalosporin use.    Sod Citrate-Citric Acid Other (See Comments)    Patient Care Team: Venita Lick, NP as PCP - General (Nurse Practitioner) Dagoberto Ligas, MD as Referring Physician (Internal Medicine)     Objective  There were no vitals taken for this  visit. BP Readings from Last 3 Encounters:  02/08/22 137/73  10/10/21 (!) 154/76  09/20/21 (!) 127/55   Wt Readings from Last 3 Encounters:  02/08/22 166 lb 6.4 oz (75.5 kg)  10/10/21 164 lb 6.4 oz (74.6 kg)  09/20/21 158 lb (71.7 kg)      Physical Exam Entirety of visit conducted over the phone. Speaks in full sentences, no respiratory distress.  Most recent functional status assessment:    06/26/2022   10:27 AM  In your present state of health, do you have any difficulty performing the following activities:   Hearing? 0  Vision? 0  Difficulty concentrating or making decisions? 0  Walking or climbing stairs? 0  Dressing or bathing? 0  Doing errands, shopping? 0  Preparing Food and eating ? N  Using the Toilet? N  In the past six months, have you accidently leaked urine? N  Do you have problems with loss of bowel control? N  Managing your Medications? N  Managing your Finances? N  Housekeeping or managing your Housekeeping? N   Most recent fall risk assessment:    06/26/2022   10:36 AM  Fall Risk   Falls in the past year? 0  Number falls in past yr: 0  Injury with Fall? 0    Most recent depression screenings:    06/26/2022   10:35 AM 02/08/2022    3:15 PM  PHQ 2/9 Scores  PHQ - 2 Score 0 0  PHQ- 9 Score  0   Most recent cognitive screening:    06/26/2022   10:36 AM  6CIT Screen  What Year? 0 points  What month? 0 points  What time? 0 points  Patient refused the remainder of the 6CIT Screen.  Most recent Audit-C alcohol use screening    06/26/2022   10:44 AM  Alcohol Use Disorder Test (AUDIT)  1. How often do you have a drink containing alcohol? 0  2. How many drinks containing alcohol do you have on a typical day when you are drinking? 0  3. How often do you have six or more drinks on one occasion? 0  AUDIT-C Score 0   A score of 3 or more in women, and 4 or more in men indicates increased risk for alcohol abuse, EXCEPT if all of the points are from question 1   Vision/Hearing Screen: No results found.  Last CBC Lab Results  Component Value Date   WBC 7.2 07/05/2021   HGB 13.5 07/05/2021   HCT 41.2 07/05/2021   MCV 95 07/05/2021   MCH 31.0 07/05/2021   RDW 12.4 07/05/2021   PLT 227 02/40/9735   Last metabolic panel Lab Results  Component Value Date   GLUCOSE 100 (H) 07/05/2021   NA 138 07/05/2021   K 4.8 07/05/2021   CL 101 07/05/2021   CO2 21 07/05/2021   BUN 30 (H) 07/05/2021   CREATININE 2.01 (H) 07/05/2021   EGFR 24 (L) 07/05/2021   CALCIUM 10.0  07/05/2021   PROT 6.7 07/05/2021   ALBUMIN 4.3 07/05/2021   LABGLOB 2.4 07/05/2021   AGRATIO 1.8 07/05/2021   BILITOT 0.6 07/05/2021   ALKPHOS 72 07/05/2021   AST 11 07/05/2021   ALT 9 07/05/2021   ANIONGAP 8 05/18/2021   Last lipids Lab Results  Component Value Date   CHOL 247 (H) 07/05/2021   HDL 75 07/05/2021   LDLCALC 146 (H) 07/05/2021   TRIG 147 07/05/2021   CHOLHDL 3.3 07/05/2021   Last hemoglobin A1c  No results found for: "HGBA1C"    No results found for any visits on 06/26/22.    Assessment & Plan   Annual wellness visit done today including the all of the following: Reviewed patient's Family Medical History Reviewed and updated list of patient's medical providers Assessment of cognitive impairment was done Assessed patient's functional ability Established a written schedule for health screening [place] Completed and Reviewed  Exercise Activities and Dietary recommendations  Goals   None     Immunization History  Administered Date(s) Administered   Fluad Quad(high Dose 65+) 02/13/2019, 03/16/2020, 02/28/2021   Influenza, High Dose Seasonal PF 02/28/2016, 03/07/2017, 03/17/2018   Influenza,inj,Quad PF,6+ Mos 03/16/2015   Influenza-Unspecified 02/22/2014, 02/23/2022   Moderna Sars-Covid-2 Vaccination 07/17/2019, 08/14/2019, 05/05/2020   Pneumococcal Conjugate-13 12/30/2013   Pneumococcal-Unspecified 02/17/2004, 05/03/2009   Td 04/14/2008   Zoster, Live 04/22/2006    Health Maintenance  Topic Date Due   Pneumonia Vaccine 41+ Years old (2 - PPSV23 or PCV20) 12/31/2014   DTaP/Tdap/Td (2 - Tdap) 04/14/2018   COVID-19 Vaccine (4 - 2023-24 season) 02/02/2022   Zoster Vaccines- Shingrix (1 of 2) 09/25/2022 (Originally [birth year]/1985)   Medicare Annual Wellness (AWV)  06/27/2023   INFLUENZA VACCINE  Completed   DEXA SCAN  Completed   HPV VACCINES  Aged Out   Advanced Care Planning: A voluntary discussion about advance care planning  including the explanation and discussion of advance directives.  Discussed health care proxy and Living will, and the patient was not able to identify a health care proxy.  Patient does not have a living will at present time. Patient does not wish to have additional information regarding advanced directives.   Discussed health benefits of physical activity, and encouraged her to engage in regular exercise appropriate for her age and condition.    Problem List Items Addressed This Visit       Cardiovascular and Mediastinum   Aortic atherosclerosis ([place])   Essential hypertension     Endocrine   Secondary hyperparathyroidism of renal origin ([place])     Genitourinary   Chronic kidney disease, stage 4, severely decreased GFR ([place])     Other   Advanced care planning/counseling discussion   Other Visit Diagnoses     Encounter for Medicare annual wellness exam    -  Primary       Return in about 1 year (around 06/27/2023) for awv.     Myles Gip, DO

## 2022-07-07 ENCOUNTER — Ambulatory Visit: Payer: Medicare HMO

## 2022-07-07 ENCOUNTER — Ambulatory Visit (INDEPENDENT_AMBULATORY_CARE_PROVIDER_SITE_OTHER): Payer: Medicare HMO

## 2022-07-07 ENCOUNTER — Ambulatory Visit
Admission: RE | Admit: 2022-07-07 | Discharge: 2022-07-07 | Disposition: A | Discharge status: Home / Self Care | Payer: Medicare HMO | Source: Ambulatory Visit | Attending: Family Medicine | Admitting: Family Medicine

## 2022-07-07 VITALS — BP 158/74 | HR 67 | Temp 98.1°F | Resp 14 | Ht 67.0 in | Wt 160.0 lb

## 2022-07-07 DIAGNOSIS — R059 Cough, unspecified: Secondary | ICD-10-CM | POA: Diagnosis not present

## 2022-07-07 DIAGNOSIS — J40 Bronchitis, not specified as acute or chronic: Secondary | ICD-10-CM | POA: Diagnosis not present

## 2022-07-07 MED ORDER — DOXYCYCLINE HYCLATE 100 MG PO TABS
100.0000 mg | ORAL_TABLET | Freq: Two times a day (BID) | ORAL | 0 refills | Status: DC
Start: 2022-07-07 — End: 2022-08-05

## 2022-07-07 NOTE — ED Provider Notes (Signed)
[facility]    CSN: [id number] Arrival date & time: 07/07/22  1410      History   Chief Complaint Chief Complaint  Patient presents with   Cough    Appointment    HPI 89 year old female presents with cough.  Patient has underlying CKD.  Followed by nephrology.  She reports cough for the past 2 to 3 weeks.  Productive.  No fever.  No shortness of breath.  She has been using Mucinex with improvement but no resolution.  No other associated symptoms.  No other complaints.   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   Osteoporosis    Renal insufficiency    Vertigo     Patient Active Problem List   Diagnosis Date Noted   Aortic atherosclerosis ([place]) 07/08/2020   Secondary hyperparathyroidism of renal origin ([place]) 06/10/2019   Advanced care planning/counseling discussion 06/24/2017   Hypothyroidism 05/05/2015   Hyperlipidemia 05/05/2015   Atrophy of right kidney 03/16/2015   Chronic kidney disease, stage 4, severely decreased GFR ([place]) 03/16/2015   Shoulder bursitis 03/16/2015   Essential hypertension     Past Surgical History:  Procedure Laterality Date   BREAST BIOPSY     CATARACT EXTRACTION W/PHACO Left 09/20/2021   Procedure: CATARACT EXTRACTION PHACO AND INTRAOCULAR LENS PLACEMENT ([place]) LEFT;  Surgeon: Leandrew Koyanagi, MD;  Location: [facility];  Service: Ophthalmology;  Laterality: Left;  21.54 01:50.0   COLONOSCOPY W/ POLYPECTOMY     COLONOSCOPY WITH PROPOFOL N/A 06/02/2015   Procedure: COLONOSCOPY WITH PROPOFOL;  Surgeon: Manya Silvas, MD;  [facility] ENDOSCOPY;  Service: Endoscopy;  Laterality: N/A;    OB History   No obstetric history on file.      Home Medications    Prior to Admission medications   Medication Sig Start Date End Date Taking? Authorizing Provider  doxycycline  (VIBRA-TABS) 100 MG tablet Take 1 tablet (100 mg total) by mouth 2 (two) times daily. 07/07/22  Yes Shenetta Schnackenberg G, DO  amLODipine (NORVASC) 10 MG tablet Take 1 tablet (10 mg total) by mouth daily. 02/08/22   Kathrine Haddock, NP  calcitRIOL (ROCALTROL) 0.25 MCG capsule Take 0.25 mcg by mouth daily.  11/24/15   [provider]  famotidine (PEPCID) 20 MG tablet TAKE 1 TABLET BY MOUTH EVERYDAY AT BEDTIME 04/09/22   Cannady, Jolene T, NP  hydrALAZINE (APRESOLINE) 25 MG tablet Take 1 tablet (25 mg total) by mouth 3 (three) times daily. 03/02/22   Cannady, Henrine Screws T, NP  levothyroxine (SYNTHROID) 50 MCG tablet Take 1 tablet (50 mcg total) by mouth daily. 02/08/22   Kathrine Haddock, NP  lisinopril (ZESTRIL) 10 MG tablet Take 1 tablet (10 mg total) by mouth daily. 02/08/22   Kathrine Haddock, NP  sodium bicarbonate 650 MG tablet Take 650 mg by mouth 2 (two) times daily.    [provider]  vitamin B-12 (CYANOCOBALAMIN) 250 MCG tablet Take 500 mcg by mouth daily.     [provider]  Vitamin D3 (VITAMIN D) 25 MCG tablet Take 1,000 Units by mouth daily.    [provider]    Family History Family History  Problem Relation Age of Onset   Hypertension Mother    Heart disease Father    Breast cancer Sister     Social History Social History  Tobacco Use   Smoking status: Never    Passive exposure: Never   Smokeless tobacco: Never  Vaping Use   Vaping Use: Never used  Substance Use Topics   Alcohol use: No   Drug use: No     Allergies   Amoxicillin and Sod citrate-citric acid   Review of Systems Review of Systems  Constitutional: Negative.   Respiratory:  Positive for cough. Negative for shortness of breath.    Physical Exam Triage Vital Signs ED Triage Vitals  Enc Vitals Group     BP 07/07/22 1418 (!) 158/74     Pulse Rate 07/07/22 1418 67     Resp 07/07/22 1418 14     Temp 07/07/22 1418 98.1 F (36.7 C)     Temp Source 07/07/22 1418 Oral     SpO2 07/07/22  1418 98 %     Weight 07/07/22 1417 160 lb (72.6 kg)     Height 07/07/22 1417 '5\' 7"'$  (1.702 m)     Head Circumference --      Peak Flow --      Pain Score 07/07/22 1417 0     Pain Loc --      Pain Edu? --      Excl. in [place]? --     Updated Vital Signs BP (!) 158/74 (BP Location: Left Arm)   Pulse 67   Temp 98.1 F (36.7 C) (Oral)   Resp 14   Ht '5\' 7"'$  (1.702 m)   Wt 72.6 kg   SpO2 98%   BMI 25.06 kg/m   Visual Acuity Right Eye Distance:   Left Eye Distance:   Bilateral Distance:    Right Eye Near:   Left Eye Near:    Bilateral Near:     Physical Exam Constitutional:      General: She is not in acute distress.    Appearance: Normal appearance.  HENT:     Head: Normocephalic and atraumatic.  Cardiovascular:     Rate and Rhythm: Normal rate and regular rhythm.  Pulmonary:     Effort: Pulmonary effort is normal.     Breath sounds: Normal breath sounds. No wheezing, rhonchi or rales.  Neurological:     Mental Status: She is alert.  Psychiatric:        Mood and Affect: Mood normal.        Behavior: Behavior normal.      UC Treatments / Results  Labs (all labs ordered are listed, but only abnormal results are displayed) Labs Reviewed - No data to display  EKG   Radiology DG Chest 2 View  Result Date: 07/07/2022 CLINICAL DATA:  Cough, chest congestion EXAM: CHEST - 2 VIEW COMPARISON:  05/18/2021 FINDINGS: The heart size and mediastinal contours are within normal limits. Both lungs are clear. The visualized skeletal structures are unremarkable. IMPRESSION: No acute abnormality of the lungs. Electronically Signed   By: Delanna Ahmadi M.D.   On: 07/07/2022 15:10    Procedures Procedures (including critical care time)  Medications Ordered in UC Medications - No data to display  Initial Impression / Assessment and Plan / UC Course  I have reviewed the triage vital signs and the nursing notes.  Pertinent labs & imaging results that were available during my care of  the patient were reviewed by me and considered in my medical decision making (see chart for details).    89 year old female presents with persistent cough.  Chest x-ray obtained and was independently reviewed by me.  Interpretation: Normal chest x-ray.  No evidence of infiltrate.  Doxycycline for acute bronchitis.  Supportive care.  Final Clinical Impressions(s) / UC Diagnoses   Final diagnoses:  Bronchitis   Discharge Instructions   None    ED Prescriptions     Medication Sig Dispense Auth. Provider   doxycycline (VIBRA-TABS) 100 MG tablet Take 1 tablet (100 mg total) by mouth 2 (two) times daily. 14 tablet Coral Spikes, DO      PDMP not reviewed this encounter.   Coral Spikes, [place] 07/07/22 1529

## 2022-07-07 NOTE — ED Triage Notes (Signed)
Patient c/o cough and chest congestion for 2 weeks.  Patient denies fevers.

## 2022-07-09 NOTE — Addendum Note (Signed)
Addended by: Myles Gip on: 07/09/2022 08:31 AM   Modules accepted: Level of Service

## 2022-07-24 DIAGNOSIS — D2272 Melanocytic nevi of left lower limb, including hip: Secondary | ICD-10-CM | POA: Diagnosis not present

## 2022-07-24 DIAGNOSIS — D485 Neoplasm of uncertain behavior of skin: Secondary | ICD-10-CM | POA: Diagnosis not present

## 2022-07-24 DIAGNOSIS — D0439 Carcinoma in situ of skin of other parts of face: Secondary | ICD-10-CM | POA: Diagnosis not present

## 2022-07-24 DIAGNOSIS — Z08 Encounter for follow-up examination after completed treatment for malignant neoplasm: Secondary | ICD-10-CM | POA: Diagnosis not present

## 2022-07-24 DIAGNOSIS — D2261 Melanocytic nevi of right upper limb, including shoulder: Secondary | ICD-10-CM | POA: Diagnosis not present

## 2022-07-24 DIAGNOSIS — D0371 Melanoma in situ of right lower limb, including hip: Secondary | ICD-10-CM | POA: Diagnosis not present

## 2022-07-24 DIAGNOSIS — D2262 Melanocytic nevi of left upper limb, including shoulder: Secondary | ICD-10-CM | POA: Diagnosis not present

## 2022-07-24 DIAGNOSIS — Z85828 Personal history of other malignant neoplasm of skin: Secondary | ICD-10-CM | POA: Diagnosis not present

## 2022-07-24 DIAGNOSIS — L821 Other seborrheic keratosis: Secondary | ICD-10-CM | POA: Diagnosis not present

## 2022-07-24 DIAGNOSIS — D2271 Melanocytic nevi of right lower limb, including hip: Secondary | ICD-10-CM | POA: Diagnosis not present

## 2022-07-26 DIAGNOSIS — N184 Chronic kidney disease, stage 4 (severe): Secondary | ICD-10-CM | POA: Diagnosis not present

## 2022-07-26 DIAGNOSIS — R300 Dysuria: Secondary | ICD-10-CM | POA: Diagnosis not present

## 2022-08-02 DIAGNOSIS — I10 Essential (primary) hypertension: Secondary | ICD-10-CM | POA: Diagnosis not present

## 2022-08-02 DIAGNOSIS — N184 Chronic kidney disease, stage 4 (severe): Secondary | ICD-10-CM | POA: Diagnosis not present

## 2022-08-02 DIAGNOSIS — R809 Proteinuria, unspecified: Secondary | ICD-10-CM | POA: Diagnosis not present

## 2022-08-02 DIAGNOSIS — N2581 Secondary hyperparathyroidism of renal origin: Secondary | ICD-10-CM | POA: Diagnosis not present

## 2022-08-03 ENCOUNTER — Other Ambulatory Visit: Payer: Self-pay | Admitting: Nurse Practitioner

## 2022-08-03 ENCOUNTER — Other Ambulatory Visit: Payer: Self-pay | Admitting: Unknown Physician Specialty

## 2022-08-03 DIAGNOSIS — N184 Chronic kidney disease, stage 4 (severe): Secondary | ICD-10-CM

## 2022-08-03 DIAGNOSIS — I10 Essential (primary) hypertension: Secondary | ICD-10-CM

## 2022-08-03 NOTE — Telephone Encounter (Signed)
Rx was sent to pharmacy on 04/09/22 #90/1 RF. Pt has medication to last until 10/2022.   Requested Prescriptions  Refused Prescriptions Disp Refills   famotidine (PEPCID) 20 MG tablet [Pharmacy Med Name: FAMOTIDINE 20 MG TABLET] 90 tablet 1    Sig: TAKE 1 TABLET BY MOUTH EVERYDAY AT BEDTIME     Gastroenterology:  H2 Antagonists Passed - 08/03/2022  8:11 AM      Passed - Valid encounter within last 12 months    Recent Outpatient Visits           1 month ago Encounter for [employer] annual wellness exam   [place] Myles Gip, DO   5 months ago Acute cough   [place] Kathrine Haddock, NP   9 months ago Hyperlipidemia, unspecified hyperlipidemia type   [place] Vigg, Avanti, MD   1 year ago Hypothyroidism, unspecified type   [place] Vigg, Avanti, MD   1 year ago [place] Vigg, Loman [place], MD       Future Appointments             In 6 days [place], Barbaraann Faster, NP [place], [place]

## 2022-08-05 NOTE — Patient Instructions (Signed)
Food Basics for Chronic Kidney Disease Chronic kidney disease (CKD) is when your kidneys are not working well. They cannot remove waste, fluids, and other substances from your blood the way they should. These substances can build up, which can worsen kidney damage and affect how your body works. Eating certain foods can lead to a buildup of these substances. Changing your diet can help prevent more kidney damage. Diet changes may also delay dialysis or even keep you from needing it. What nutrients should I limit? Work with your treatment team and a food expert (dietitian) to make a meal plan that's right for you. Foods you can eat and foods you should limit or avoid will depend on the stage of your kidney disease and any other health conditions you have. The items listed below are not a complete list. Talk with your dietitian to learn what is best for you. Potassium Potassium affects how steadily your heart beats. Too much potassium in your blood can cause an irregular heartbeat or even a heart attack. You may need to limit foods that are high in potassium, such as: Liquid milk and soy milk. Salt substitutes that contain potassium. Fruits like bananas, apricots, nectarines, melon, prunes, raisins, kiwi, and oranges. Vegetables, such as potatoes, sweet potatoes, yams, tomatoes, leafy greens, beets, avocado, pumpkin, and winter squash. Beans, like lima beans. Nuts. Phosphorus Phosphorus is a mineral found in your bones. You need a balance between calcium and phosphorus to build and maintain healthy bones. Too much added phosphorus from the foods you eat can pull calcium from your bones. Losing calcium can make your bones weak and more likely to break. Too much phosphorus can also make your skin itch. You may need to limit foods that are high in phosphorus or that have added phosphorus, such as: Liquid milk and dairy products. Dark-colored sodas or soft drinks. Bran cereals and  oatmeal. Protein  Protein helps you make and keep muscle. Protein also helps to repair your body's cells and tissues. One of the natural breakdown products of protein is a waste product called urea. When your kidneys are not working well, they cannot remove types of waste like urea. Reducing protein in your diet can help keep urea from building up in your blood. Depending on your stage of kidney disease, you may need to eat smaller portions of foods that are high in protein. Sources of animal protein include: Meat (all types). Fish and seafood. Poultry. Eggs. Dairy. Other protein foods include: Beans and legumes. Nuts and nut butter. Soy, like tofu.  Sodium Salt (sodium) helps to keep a healthy balance of fluids in your body. Too much salt can increase your blood pressure, which can harm your heart and lungs. Extra salt can also cause your body to keep too much fluid, making your kidneys work harder. You may need to limit or avoid foods that are high in salt, such as: Salt seasonings. Soy and teriyaki sauce. Packaged, precooked, cured, or processed meats, such as sausages or meat loaves. Sardines. Salted crackers and snack foods. Fast food. Canned soups and most canned foods. Pickled foods. Vegetable juice. Boxed mixes or ready-to-eat boxed meals and side dishes. Bottled dressings, sauces, and marinades. Talk with your dietitian about how much potassium, phosphorus, protein, and salt you may have each day. Helpful tips Read food labels  Check the amount of salt in foods. Limit foods that have salt or sodium listed among the first five ingredients. Try to eat low-salt foods. Check the ingredient list   for added phosphorus or potassium. "Phos" in an ingredient is a sign that phosphorus has been added. Do not buy foods that are calcium-enriched or that have calcium added to them (are fortified). Buy canned vegetables and beans that say "no salt added" and rinse them before  eating. Lifestyle Limit the amount of protein you eat from animal sources each day. Focus on protein from plant sources, like tofu and dried beans, peas, and lentils. Do not add salt to food when cooking or before eating. Do not eat star fruit. It can be toxic for people with kidney problems. Talk with your health care provider before taking any vitamin or mineral supplements. If told by your health care provider, track how much liquid you drink so you can avoid drinking too much. You may need to include foods you eat that are made mostly from water, like gelatin, ice cream, soups, and juicy fruits and vegetables. If you have diabetes: If you have diabetes (diabetes mellitus) and CKD, you need to keep your blood sugar (glucose) in the target range recommended by your health care provider. Follow your diabetes management plan. This may include: Checking your blood glucose regularly. Taking medicines by mouth, or taking insulin, or both. Exercising for at least 30 minutes on 5 or more days each week, or as told by your health care provider. Tracking how many servings of carbohydrates you eat at each meal. Not using orange juice to treat low blood sugars. Instead, use apple juice, cranberry juice, or clear soda. You may be given guidelines on what foods and nutrients you may eat, and how much you can have each day. This depends on your stage of kidney disease and whether you have high blood pressure (hypertension). Follow the meal plan your dietitian gives you. To learn more: National Institute of Diabetes and Digestive and Kidney Diseases: niddk.nih.gov National Kidney Foundation: kidney.org Summary Chronic kidney disease (CKD) is when your kidneys are not working well. They cannot remove waste, fluids, and other substances from your blood the way they should. These substances can build up, which can worsen kidney damage and affect how your body works. Changing your diet can help prevent more  kidney damage. Diet changes may also delay dialysis or even keep you from needing it. Diet changes are different for each person with CKD. Work with a dietitian to set up a meal plan that is right for you. This information is not intended to replace advice given to you by your health care provider. Make sure you discuss any questions you have with your health care provider. Document Revised: 09/08/2021 Document Reviewed: 09/14/2019 Elsevier Patient Education  2023 Elsevier Inc.  

## 2022-08-09 ENCOUNTER — Encounter: Payer: Self-pay | Admitting: Nurse Practitioner

## 2022-08-09 ENCOUNTER — Ambulatory Visit (INDEPENDENT_AMBULATORY_CARE_PROVIDER_SITE_OTHER): Payer: Medicare HMO | Admitting: Nurse Practitioner

## 2022-08-09 VITALS — BP 122/68 | HR 67 | Temp 97.9°F | Wt 165.6 lb

## 2022-08-09 DIAGNOSIS — N2581 Secondary hyperparathyroidism of renal origin: Secondary | ICD-10-CM

## 2022-08-09 DIAGNOSIS — I10 Essential (primary) hypertension: Secondary | ICD-10-CM

## 2022-08-09 DIAGNOSIS — E039 Hypothyroidism, unspecified: Secondary | ICD-10-CM

## 2022-08-09 DIAGNOSIS — E782 Mixed hyperlipidemia: Secondary | ICD-10-CM | POA: Diagnosis not present

## 2022-08-09 DIAGNOSIS — I700 Atherosclerosis of aorta: Secondary | ICD-10-CM

## 2022-08-09 DIAGNOSIS — N184 Chronic kidney disease, stage 4 (severe): Secondary | ICD-10-CM | POA: Diagnosis not present

## 2022-08-09 MED ORDER — ROSUVASTATIN CALCIUM 10 MG PO TABS: 10.0000 mg | ORAL_TABLET | Freq: Every day | ORAL | 4 refills | Status: DC

## 2022-08-09 MED ORDER — LISINOPRIL 10 MG PO TABS: 10.0000 mg | ORAL_TABLET | Freq: Every day | ORAL | 4 refills | Status: DC

## 2022-08-09 MED ORDER — AMLODIPINE BESYLATE 10 MG PO TABS: 10.0000 mg | ORAL_TABLET | Freq: Every day | ORAL | 4 refills | Status: DC

## 2022-08-09 MED ORDER — HYDRALAZINE HCL 25 MG PO TABS: 25.0000 mg | ORAL_TABLET | Freq: Three times a day (TID) | ORAL | 4 refills | Status: DC

## 2022-08-09 NOTE — Assessment & Plan Note (Signed)
Chronic.  Noted on imaging 01/27/2019.  Recommend to continue healthy diet focus. Start Crestor as ordered by previous PCP and if does not tolerate is aware to alert PCP.

## 2022-08-09 NOTE — Assessment & Plan Note (Signed)
Chronic, ongoing.  Statin discontinued a few years back due to myalgias and fatigue, which improved without statin.  Recent PCP ordered Crestor which she has not started, was not aware of this being added.  Recommend she trial it and if any issues alert PCP, will then stop this.  Due to advanced age discussed at length, if does not tolerate she would prefer no medication.

## 2022-08-09 NOTE — Assessment & Plan Note (Signed)
Chronic, stable.  BP at goal today in office.  Recommend she monitor BP at least a few mornings a week at home and document.  DASH diet at home.  Continue current medication regimen and adjust as needed.  Labs today: up to date with nephrology.  Return in 6 months.

## 2022-08-09 NOTE — Assessment & Plan Note (Signed)
Chronic, ongoing.  Continue current medication regimen and adjust as needed.  Obtain TSH, Free T4, and antibody today.  Return in 6 months.

## 2022-08-09 NOTE — Assessment & Plan Note (Signed)
Chronic, ongoing.  Followed by nephrology, continue current medication regimen as recommended by them.  Recent labs and notes reviewed.

## 2022-08-09 NOTE — Progress Notes (Signed)
BP 122/68   Pulse 67   Temp 97.9 F (36.6 C) (Oral)   Wt 165 lb 9.6 oz (75.1 kg)   SpO2 98%   BMI 25.94 kg/m    Subjective:    Patient ID: Karla Little, female    DOB: [birth year], 89 y.o.   MRN: [id number]  HPI: Karla Little is a 89 y.o. female  Chief Complaint  Patient presents with   Hyperlipidemia   Hypertension   HYPERTENSION / HYPERLIPIDEMIA Continues on Lisinopril, Hydralazine, and Amlodipine. To be taking Rosuvastatin for HLD, but was not aware to take and is not taking.  Had issues with Lipitor in past making her feel bad.  Aortic atherosclerosis noted on past imaging. Satisfied with current treatment? yes Duration of hypertension: chronic BP monitoring frequency: not checking BP range:  BP medication side effects: no Duration of hyperlipidemia: chronic Cholesterol medication side effects: no Cholesterol supplements: none Medication compliance: poor compliance Aspirin: no Recent stressors: no Recurrent headaches: no Visual changes: no Palpitations: no Dyspnea: no Chest pain: no Lower extremity edema: no Dizzy/lightheaded: no   CHRONIC KIDNEY DISEASE Saw nephrology 08/02/22 == CRT 1.72, eGFR 29, BUN 26, PTH 71. Sees every 4 months. CKD status: stable Medications renally dose: yes Previous renal evaluation: yes Pneumovax:  Up to Date Influenza Vaccine:   she reports having them at age 89    HYPOTHYROIDISM Continues Levothyroxine 50 MCG daily. Thyroid control status:stable Satisfied with current treatment? yes Medication side effects: no Medication compliance: good compliance Etiology of hypothyroidism:  Recent dose adjustment:no Fatigue: no Cold intolerance: no Heat intolerance: no Weight gain: no Weight loss: no Constipation: occasional Diarrhea/loose stools: occasional Palpitations: no Lower extremity edema:  occasional Anxiety/depressed mood: no   Relevant past medical, surgical, family and social history reviewed and updated as  indicated. Interim medical history since our last visit reviewed. Allergies and medications reviewed and updated.  Review of Systems  Constitutional:  Negative for activity change, appetite change, diaphoresis, fatigue and fever.  Respiratory:  Negative for cough, chest tightness and shortness of breath.   Cardiovascular:  Negative for chest pain, palpitations and leg swelling.  Gastrointestinal: Negative.   Endocrine: Negative for cold intolerance, heat intolerance and polyuria.  Neurological: Negative.   Psychiatric/Behavioral: Negative.      Per HPI unless specifically indicated above     Objective:    BP 122/68   Pulse 67   Temp 97.9 F (36.6 C) (Oral)   Wt 165 lb 9.6 oz (75.1 kg)   SpO2 98%   BMI 25.94 kg/m   Wt Readings from Last 3 Encounters:  08/09/22 165 lb 9.6 oz (75.1 kg)  07/07/22 160 lb (72.6 kg)  02/08/22 166 lb 6.4 oz (75.5 kg)    Physical Exam Vitals and nursing note reviewed.  Constitutional:      General: She is awake. She is not in acute distress.    Appearance: She is well-developed and well-groomed. She is not ill-appearing or toxic-appearing.  HENT:     Head: Normocephalic.     Right Ear: Hearing and external ear normal.     Left Ear: Hearing and external ear normal.  Eyes:     General: Lids are normal.        Right eye: No discharge.        Left eye: No discharge.     Conjunctiva/sclera: Conjunctivae normal.     Pupils: Pupils are equal, round, and reactive to light.  Neck:     Thyroid:  No thyromegaly.     Vascular: No carotid bruit or JVD.  Cardiovascular:     Rate and Rhythm: Normal rate and regular rhythm.     Heart sounds: Normal heart sounds. No murmur heard.    No gallop.  Pulmonary:     Effort: Pulmonary effort is normal.     Breath sounds: Normal breath sounds.  Abdominal:     General: Bowel sounds are normal.     Palpations: Abdomen is soft.  Musculoskeletal:     Cervical back: Normal range of motion and neck supple.      Right lower leg: No edema.     Left lower leg: No edema.  Lymphadenopathy:     Cervical: No cervical adenopathy.  Skin:    General: Skin is warm and dry.  Neurological:     Mental Status: She is alert and oriented to person, place, and time.  Psychiatric:        Attention and Perception: Attention normal.        Mood and Affect: Mood normal.        Behavior: Behavior normal. Behavior is cooperative.        Thought Content: Thought content normal.        Judgment: Judgment normal.     Results for orders placed or performed in visit on 02/08/22  TSH  Result Value Ref Range   TSH 2.340 0.450 - 4.500 uIU/mL  T4, free  Result Value Ref Range   Free T4 1.29 0.82 - 1.77 ng/dL      Assessment & Plan:   Problem List Items Addressed This Visit       Cardiovascular and Mediastinum   Aortic atherosclerosis (HCC)    Chronic.  Noted on imaging 01/27/2019.  Recommend to continue healthy diet focus. Start Crestor as ordered by previous PCP and if does not tolerate is aware to alert PCP.      Relevant Medications   rosuvastatin (CRESTOR) 10 MG tablet   lisinopril (ZESTRIL) 10 MG tablet   hydrALAZINE (APRESOLINE) 25 MG tablet   amLODipine (NORVASC) 10 MG tablet   Other Relevant Orders   Lipid Panel w/o Chol/HDL Ratio   Essential hypertension    Chronic, stable.  BP at goal today in office.  Recommend she monitor BP at least a few mornings a week at home and document.  DASH diet at home.  Continue current medication regimen and adjust as needed.  Labs today: up to date with nephrology.  Return in 6 months.       Relevant Medications   rosuvastatin (CRESTOR) 10 MG tablet   lisinopril (ZESTRIL) 10 MG tablet   hydrALAZINE (APRESOLINE) 25 MG tablet   amLODipine (NORVASC) 10 MG tablet     Endocrine   Hypothyroidism    Chronic, ongoing.  Continue current medication regimen and adjust as needed.  Obtain TSH, Free T4, and antibody today.  Return in 6 months.      Relevant Orders   T4,  free   Thyroid peroxidase antibody   TSH   Secondary hyperparathyroidism of renal origin (HCC)    Chronic, ongoing.  Followed by nephrology, continue current medication regimen as recommended by them.  Recent labs and notes reviewed.        Genitourinary   Chronic kidney disease, stage 4, severely decreased GFR (HCC) - Primary    Chronic, ongoing, followed by nephrology.  Continue this collaboration and medication regimen as recommended by them.  Recent labs and notes reviewed.  Relevant Medications   lisinopril (ZESTRIL) 10 MG tablet   amLODipine (NORVASC) 10 MG tablet     Other   Hyperlipidemia    Chronic, ongoing.  Statin discontinued a few years back due to myalgias and fatigue, which improved without statin.  Recent PCP ordered Crestor which she has not started, was not aware of this being added.  Recommend she trial it and if any issues alert PCP, will then stop this.  Due to advanced age discussed at length, if does not tolerate she would prefer no medication.      Relevant Medications   rosuvastatin (CRESTOR) 10 MG tablet   lisinopril (ZESTRIL) 10 MG tablet   hydrALAZINE (APRESOLINE) 25 MG tablet   amLODipine (NORVASC) 10 MG tablet   Other Relevant Orders   Lipid Panel w/o Chol/HDL Ratio     Follow up plan: Return in about 6 months (around 02/09/2023) for HTN/HLD, CKD, THYROID.

## 2022-08-09 NOTE — Assessment & Plan Note (Signed)
Chronic, ongoing, followed by nephrology.  Continue this collaboration and medication regimen as recommended by them.  Recent labs and notes reviewed.

## 2022-08-10 LAB — THYROID PEROXIDASE ANTIBODY: Thyroperoxidase Ab SerPl-aCnc: <9 IU/mL (ref 0–34)

## 2022-08-10 LAB — T4, FREE: Free T4: 1.22 ng/dL (ref 0.82–1.77)

## 2022-08-10 LAB — LIPID PANEL W/O CHOL/HDL RATIO
Cholesterol, Total: 177 mg/dL (ref 100–199)
HDL: 83 mg/dL (ref >39)
LDL Chol Calc (NIH): 75 mg/dL (ref 0–99)
Triglycerides: 111 mg/dL (ref 0–149)
VLDL Cholesterol Cal: 19 mg/dL (ref 5–40)

## 2022-08-10 LAB — TSH: TSH: 4.13 u[IU]/mL (ref 0.450–4.500)

## 2022-08-10 NOTE — Progress Notes (Signed)
Contacted via [place] morning Caysie, your labs have returned and overall remain stable.  No medication changes needed.  Continue all current medications.  Great job!! Keep being amazing!!  Thank you for allowing me to participate in your care.  I appreciate you. Kindest regards, Laurey Salser

## 2022-08-22 DIAGNOSIS — D0471 Carcinoma in situ of skin of right lower limb, including hip: Secondary | ICD-10-CM | POA: Diagnosis not present

## 2022-08-22 DIAGNOSIS — L578 Other skin changes due to chronic exposure to nonionizing radiation: Secondary | ICD-10-CM | POA: Diagnosis not present

## 2022-08-22 DIAGNOSIS — L814 Other melanin hyperpigmentation: Secondary | ICD-10-CM | POA: Diagnosis not present

## 2022-10-30 ENCOUNTER — Other Ambulatory Visit: Payer: Self-pay | Admitting: Nurse Practitioner

## 2022-10-31 NOTE — Telephone Encounter (Signed)
Requested Prescriptions  Pending Prescriptions Disp Refills   famotidine (PEPCID) 20 MG tablet [Pharmacy Med Name: FAMOTIDINE 20 MG TABLET] 90 tablet 1    Sig: TAKE 1 TABLET BY MOUTH EVERYDAY AT BEDTIME     Gastroenterology:  H2 Antagonists Passed - 10/30/2022 12:12 PM      Passed - Valid encounter within last 12 months    Recent Outpatient Visits           2 months ago Chronic kidney disease, stage 4, severely decreased GFR (HCC)   [place] [facility] Spencer, Orchid T, NP   4 months ago Encounter for [employer] annual wellness exam   [place] [facility] Caro Laroche, DO   8 months ago Acute cough   [facility] [facility] Gabriel Cirri, NP   1 year ago Hyperlipidemia, unspecified hyperlipidemia type   [place] [facility] Vigg, Avanti, MD   1 year ago Hypothyroidism, unspecified type   [place] [facility] Vigg, Avanti, MD       Future Appointments             In 3 months Cannady, Dorie Rank, NP [facility]

## 2022-11-10 ENCOUNTER — Ambulatory Visit
Admission: EM | Admit: 2022-11-10 | Discharge: 2022-11-10 | Disposition: A | Discharge status: Home / Self Care | Payer: Medicare HMO

## 2022-11-10 DIAGNOSIS — R42 Dizziness and giddiness: Secondary | ICD-10-CM | POA: Diagnosis not present

## 2022-11-10 NOTE — ED Provider Notes (Signed)
[facility]    CSN: [id number] Arrival date & time: 11/10/22  0836      History   Chief Complaint Chief Complaint  Patient presents with   Neck Pain    HPI Karla Little is a 89 y.o. female.   89 year old female, Karla Little, presents to Urgent Care with chief complaint of dizziness that occurs with standing x a few weeks, right sided neck pain. Pt denies any trauma or fall, pain is not reproducible with movement or palpation. Pt states she thinks she has "a blockage in her neck and I'm not getting good blood flow to head". Pt denies palpitations or chest pain at present. Pt denies fall or trauma, has not slept in unusual position.   PMH:   Diagnosis    Date Noted   Aortic atherosclerosis (HCC) 07/08/2020  Secondary hyperparathyroidism of renal origin (HCC) 06/10/2019  Advanced care planning/counseling discussion 06/24/2017  Hypothyroidism 05/05/2015  Hyperlipidemia 05/05/2015  Atrophy of right kidney 03/16/2015  Chronic kidney disease, stage 4, severely decreased GFR (HCC) 03/16/2015  Essential hypertension    The history is provided by the patient. No language interpreter was used.    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   Osteoporosis    Renal insufficiency    Vertigo    Patient Active Problem List   Diagnosis Date Noted   Dizzy 11/10/2022   Aortic atherosclerosis (HCC) 07/08/2020   Secondary hyperparathyroidism of renal origin (HCC) 06/10/2019   Advanced care planning/counseling discussion 06/24/2017   Hypothyroidism 05/05/2015   Hyperlipidemia 05/05/2015   Atrophy of right kidney 03/16/2015   Chronic kidney disease, stage 4, severely decreased GFR (HCC) 03/16/2015   Essential hypertension      Past Surgical History:  Procedure Laterality Date   BREAST BIOPSY     CATARACT EXTRACTION  W/PHACO Left 09/20/2021   Procedure: CATARACT EXTRACTION PHACO AND INTRAOCULAR LENS PLACEMENT (IOC) LEFT;  Surgeon: Lockie Mola, MD;  [facility];  Service: Ophthalmology;  Laterality: Left;  21.54 01:50.0   COLONOSCOPY W/ POLYPECTOMY     COLONOSCOPY WITH PROPOFOL N/A 06/02/2015   Procedure: COLONOSCOPY WITH PROPOFOL;  Surgeon: Scot Jun, MD;  Location[facility];  Service: Endoscopy;  Laterality: N/A;    OB History   No obstetric history on file.      Home Medications    Prior to Admission medications   Medication Sig Start Date End Date Taking? Authorizing Provider  amLODipine (NORVASC) 10 MG tablet Take 1 tablet (10 mg total) by mouth daily. 08/09/22  Yes Cannady, Jolene T, NP  calcitRIOL (ROCALTROL) 0.25 MCG capsule Take 0.25 mcg by mouth daily.  11/24/15  Yes [provider]  famotidine (PEPCID) 20 MG tablet TAKE 1 TABLET BY MOUTH EVERYDAY AT BEDTIME 10/31/22  Yes Cannady, Jolene T, NP  hydrALAZINE (APRESOLINE) 25 MG tablet Take 1 tablet (25 mg total) by mouth 3 (three) times daily. 08/09/22  Yes Cannady, Corrie Dandy T, NP  levothyroxine (SYNTHROID) 50 MCG tablet Take 1 tablet (50 mcg total) by mouth daily. 02/08/22  Yes Gabriel Cirri, NP  lisinopril (ZESTRIL) 10 MG tablet Take 1 tablet (10 mg total) by mouth daily. 08/09/22  Yes Cannady, Jolene T, NP  rosuvastatin (CRESTOR) 10 MG tablet Take 1 tablet (10 mg total) by mouth daily. 08/09/22  Yes Cannady, Jolene T, NP  sodium bicarbonate 650 MG tablet Take 650 mg by mouth 2 (two) times daily.   Yes [provider]  vitamin B-12 (CYANOCOBALAMIN) 250 MCG tablet Take 500 mcg by mouth daily.    Yes [provider]  Vitamin D3 (VITAMIN D) 25 MCG tablet Take 1,000 Units by mouth daily.   Yes [provider]    Family History Family History  Problem Relation Age of Onset   Hypertension Mother    Heart disease Father    Breast cancer Sister     Social History Social History    Tobacco Use   Smoking status: Never    Passive exposure: Never   Smokeless tobacco: Never  Vaping Use   Vaping Use: Never used  Substance Use Topics   Alcohol use: No   Drug use: No     Allergies   Amoxicillin and Sod citrate-citric acid   Review of Systems Review of Systems  Constitutional:  Negative for fever.  Respiratory:  Negative for cough and shortness of breath.   Cardiovascular:  Negative for chest pain and palpitations.  Neurological:  Positive for dizziness.  All other systems reviewed and are negative.    Physical Exam Triage Vital Signs ED Triage Vitals  Enc Vitals Group     BP 11/10/22 0851 (!) 166/55     Pulse Rate 11/10/22 0851 66     Resp 11/10/22 0851 18     Temp 11/10/22 0851 98 F (36.7 C)     Temp src --      SpO2 11/10/22 0851 100 %     Weight 11/10/22 0850 160 lb (72.6 kg)     Height --      Head Circumference --      Peak Flow --      Pain Score 11/10/22 0850 7     Pain Loc --      Pain Edu? --      Excl. in GC? --    No data found.  Updated Vital Signs BP (!) 166/55 (BP Location: Right Arm)   Pulse 66   Temp 98 F (36.7 C)   Resp 18   Wt 160 lb (72.6 kg)   SpO2 100%   BMI 25.06 kg/m   Visual Acuity Right Eye Distance:   Left Eye Distance:   Bilateral Distance:    Right Eye Near:   Left Eye Near:    Bilateral Near:     Physical Exam Vitals and nursing note reviewed.  Constitutional:      General: She is not in acute distress.    Appearance: She is well-developed and well-groomed.  HENT:     Head: Normocephalic and atraumatic.     Right Ear: Tympanic membrane is retracted.     Left Ear: Tympanic membrane is retracted.     Nose: Nose normal.     Mouth/Throat:     Lips: Pink.     Mouth: Mucous membranes are moist.     Pharynx: Uvula midline.     Comments: +PND Eyes:     Conjunctiva/sclera: Conjunctivae normal.  Neck:     Trachea: Trachea normal.  Cardiovascular:     Rate and Rhythm: Normal rate and  regular rhythm.     Heart sounds: No murmur heard. Pulmonary:     Effort: Pulmonary effort is normal. No respiratory distress.     Breath sounds: Normal breath sounds.  Abdominal:     Palpations: Abdomen is soft.  Tenderness: There is no abdominal tenderness.  Musculoskeletal:        General: No swelling.     Cervical back: Neck supple. No pain with movement, spinous process tenderness or muscular tenderness.  Skin:    General: Skin is warm and dry.     Capillary Refill: Capillary refill takes less than 2 seconds.  Neurological:     General: No focal deficit present.     Mental Status: She is alert and oriented to person, place, and time.     GCS: GCS eye subscore is 4. GCS verbal subscore is 5. GCS motor subscore is 6.     Motor: Motor function is intact.     Coordination: Coordination is intact.     Gait: Gait is intact.  Psychiatric:        Attention and Perception: Attention normal.        Mood and Affect: Mood normal.        Speech: Speech normal.        Behavior: Behavior normal. Behavior is cooperative.      UC Treatments / Results  Labs (all labs ordered are listed, but only abnormal results are displayed) Labs Reviewed - No data to display  EKG   Radiology No results found.  Procedures Procedures (including critical care time)  Medications Ordered in UC Medications - No data to display  Initial Impression / Assessment and Plan / UC Course  I have reviewed the triage vital signs and the nursing notes.  Pertinent labs & imaging results that were available during my care of the patient were reviewed by me and considered in my medical decision making (see chart for details).    Pt thought she was at the emergency room and wanted to be evaluated. Discussed with pt we are unable to perform testing to determine blood flow in the urgent care. Given pt history and symptoms referred to ER for further evaluation, daughter is with pt. Both verbalized understanding  to this provider.   Ddx: Dizziness, CAD, TIA Final Clinical Impressions(s) / UC Diagnoses   Final diagnoses:  Dizzy     Discharge Instructions      Please go to ER for further evaluation of dizziness, dizziness with standing,and concern regarding blood flow to right side of neck, we are unable to evaluate blood flow to brain in Urgent Care.    ED Prescriptions   None    PDMP not reviewed this encounter.   Clancy Gourd, NP 11/10/22 [phone]

## 2022-11-10 NOTE — ED Triage Notes (Signed)
Pt states that she's having a pain on the right side of her neck. Dizzy. Several weeks.

## 2022-11-10 NOTE — Discharge Instructions (Signed)
Please go to ER for further evaluation of dizziness, dizziness with standing,and concern regarding blood flow to right side of neck, we are unable to evaluate blood flow to brain in Urgent Care.

## 2022-11-27 ENCOUNTER — Encounter: Payer: Self-pay | Admitting: Physician Assistant

## 2022-11-27 ENCOUNTER — Ambulatory Visit (INDEPENDENT_AMBULATORY_CARE_PROVIDER_SITE_OTHER): Payer: Medicare HMO | Admitting: Physician Assistant

## 2022-11-27 VITALS — BP 128/84 | HR 94 | Temp 98.0°F | Resp 16 | Ht 67.32 in | Wt 166.4 lb

## 2022-11-27 DIAGNOSIS — N184 Chronic kidney disease, stage 4 (severe): Secondary | ICD-10-CM | POA: Diagnosis not present

## 2022-11-27 DIAGNOSIS — R413 Other amnesia: Secondary | ICD-10-CM | POA: Diagnosis not present

## 2022-11-27 DIAGNOSIS — R42 Dizziness and giddiness: Secondary | ICD-10-CM | POA: Diagnosis not present

## 2022-11-27 NOTE — Progress Notes (Signed)
Acute Office Visit   Patient: Karla Little   DOB: 1934/04/16   89 y.o. Female  MRN: [id number] Visit Date: 11/27/2022  Today's healthcare provider: Oswaldo Conroy Carisa Backhaus, PA-C  Introduced myself to the patient as a Secondary school teacher and provided education on APPs in clinical practice.    Chief Complaint  Patient presents with   Dizziness   Nausea   Memory Loss   Subjective    HPI    She also reports pain bilaterally at the base of her skull    DIZZINESS Duration: weeks Description of symptoms: not room spinning Duration of episode:  Unsure  Dizziness frequency:  states its been ongoing for several weeks  Provoking factors:  standing up and while standing  Aggravating factors:   standing  Triggered by rolling over in bed: no Triggered by bending over: no Aggravated by head movement: no but reports mild pain in her neck  Aggravated by exertion, coughing, loud noises: no Recent head injury: no Recent or current viral symptoms:  she reports a mild current cough and some mucus production in the AM   she also reports some peri-ocular swelling and itchy eyes  History of vasovagal episodes:  Unsure  Nausea: yes Vomiting: no Tinnitus: no Hearing loss: yes- chronic  Aural fullness: no Headache: no Photophobia/phonophobia: no Unsteady gait: yes Postural instability: yes Diplopia, dysarthria, dysphagia or weakness: no She reports some memory difficulties that seems to have gotten worse over the last 2-3 weeks  Related to exertion: no Pallor: no Diaphoresis: no Dyspnea: no Chest pain:  "Occasionally, but nothing that is alarming, I think"    At the end of the appointment patient asked for her daughter Geanie Berlin to join [place] in the room I discussed with patient and her daughter our findings and plan.  We also reviewed emergency room precautions and importance of prompt emergent care for severe concerns such as stroke or abrupt neurological changes. I discussed with the patient and her  daughter that I do not think that it is safe for the patient to drive herself at this time given her memory concerns, dizziness.  We also discussed the importance of a regular follow-up visit with her PCP to allow further evaluation of her memory concerns and potential referrals as needed  Medications: Outpatient Medications Prior to Visit  Medication Sig   amLODipine (NORVASC) 10 MG tablet Take 1 tablet (10 mg total) by mouth daily.   calcitRIOL (ROCALTROL) 0.25 MCG capsule Take 0.25 mcg by mouth daily.    famotidine (PEPCID) 20 MG tablet TAKE 1 TABLET BY MOUTH EVERYDAY AT BEDTIME   hydrALAZINE (APRESOLINE) 25 MG tablet Take 1 tablet (25 mg total) by mouth 3 (three) times daily.   levothyroxine (SYNTHROID) 50 MCG tablet Take 1 tablet (50 mcg total) by mouth daily.   lisinopril (ZESTRIL) 10 MG tablet Take 1 tablet (10 mg total) by mouth daily.   rosuvastatin (CRESTOR) 10 MG tablet Take 1 tablet (10 mg total) by mouth daily.   sodium bicarbonate 650 MG tablet Take 650 mg by mouth 2 (two) times daily.   vitamin B-12 (CYANOCOBALAMIN) 250 MCG tablet Take 500 mcg by mouth daily.    Vitamin D3 (VITAMIN D) 25 MCG tablet Take 1,000 Units by mouth daily.   No facility-administered medications prior to visit.    Review of Systems  See HPI for further ROS       Objective    BP 128/84   Pulse  94   Temp 98 F (36.7 C) (Oral)   Resp 16   Ht 5' 7.32" (1.71 m)   Wt 166 lb 6.4 oz (75.5 kg)   SpO2 97%   BMI 25.81 kg/m    Physical Exam Vitals reviewed.  Constitutional:      General: She is awake.     Appearance: Normal appearance. She is well-developed and well-groomed.  HENT:     Head: Normocephalic and atraumatic.     Right Ear: Hearing and ear canal normal. Tympanic membrane is retracted.     Left Ear: Hearing and ear canal normal. Tympanic membrane is retracted.     Ears:     Comments: Retracted and dull appearing TM bilaterally     Mouth/Throat:     Lips: Pink.     Mouth: Mucous  membranes are moist.     Pharynx: Oropharynx is clear. Uvula midline. No oropharyngeal exudate or posterior oropharyngeal erythema.     Tonsils: No tonsillar exudate or tonsillar abscesses.  Eyes:     General: Lids are normal. Gaze aligned appropriately.     Extraocular Movements: Extraocular movements intact.     Conjunctiva/sclera: Conjunctivae normal.     Pupils: Pupils are equal, round, and reactive to light.  Neck:     Vascular: No carotid bruit.  Cardiovascular:     Rate and Rhythm: Normal rate and regular rhythm.     Pulses: Normal pulses.          Radial pulses are 2+ on the right side and 2+ on the left side.     Heart sounds: Normal heart sounds. No murmur heard.    No friction rub. No gallop.     Comments: No carotid bruits on either side  Pulmonary:     Effort: Pulmonary effort is normal.     Breath sounds: Normal breath sounds. No decreased air movement. No decreased breath sounds, wheezing, rhonchi or rales.  Musculoskeletal:     Cervical back: Normal range of motion and neck supple.     Right lower leg: No edema.     Left lower leg: No edema.  Neurological:     General: No focal deficit present.     Mental Status: She is alert.     GCS: GCS eye subscore is 4. GCS verbal subscore is 5. GCS motor subscore is 6.     Cranial Nerves: No dysarthria or facial asymmetry.     Motor: Tremor present. No weakness, atrophy or abnormal muscle tone.     Comments: Tremors present around mouth and jaw   Psychiatric:        Mood and Affect: Mood and affect normal.        Speech: Speech is tangential.        Behavior: Behavior normal. Behavior is cooperative.     Comments: Mild inattention, needed to redirect patient several times during apt        No results found for any visits on 11/27/22.  Assessment & Plan      Return in about 4 weeks (around 12/25/2022) for memory concerns .       Problem List Items Addressed This Visit   None Visit Diagnoses     Dizziness    -   Primary Suspect that this is an acute concern and has been ongoing for the last several weeks I reviewed her urgent care visit from 11/10/2022 regarding dizziness Patient appears euvolemic, BP is in normal range, no nystagmus or abnormal EOMs,  no carotid bruits bilaterally which would indicate associated etiology Given tympanic membrane findings I suspect that her dizziness might be caused by eustachian tube dysfunction. Will get CBC, CMP, urinalysis, B12, vitamin D, thiamine level for further rule out-results to dictate further management For now I recommend adding a daily second-generation antihistamine and Flonase to assist with dizziness I also recommend use of assistive devices and making position changes slowly to avoid falls   Relevant Orders   POCT urinalysis dipstick   CBC w/Diff/Platelet   COMPLETE METABOLIC PANEL WITH GFR   B12   Vitamin D (25 hydroxy)   Vitamin B1   Memory loss     Unsure of chronicity of memory concerns as patient is a tangential and unreliable historian I am unsure of patient's baseline as this is my first time meeting her but patient was overall scattered and appears to have disorganized thought content She will need a follow-up appointment with her PCP for MMSE and further memory discussion.  A referral to neurology may also be required for further evaluation. I discussed with the patient and her daughter that I do not believe that it is safe for the patient to drive herself at this time. Patient reports that she lives alone-we may need to revisit this at a later time with her PCP    Relevant Orders   POCT urinalysis dipstick   B12   Vitamin B1        Return in about 4 weeks (around 12/25/2022) for memory concerns .   I, Spenser Cong E Miabella Shannahan, PA-C, have reviewed all documentation for this visit. The documentation on 11/27/22 for the exam, diagnosis, procedures, and orders are all accurate and complete.   Jacquelin Hawking, MHS, PA-C [facility]

## 2022-11-27 NOTE — Patient Instructions (Signed)
I recommend that you try starting an antihistamine to help with your dizziness  This includes medications like Claritin, Allegra, Zyrtec- the generics of these work very well and are usually less expensive I recommend using Flonase nasal spray - 2 puffs twice per day to help with your nasal congestion The antihistamines and Flonase can take a few weeks to provide significant relief from allergy symptoms but should start to provide some benefit soon.

## 2022-11-29 DIAGNOSIS — I10 Essential (primary) hypertension: Secondary | ICD-10-CM | POA: Diagnosis not present

## 2022-11-29 DIAGNOSIS — N184 Chronic kidney disease, stage 4 (severe): Secondary | ICD-10-CM | POA: Diagnosis not present

## 2022-11-29 DIAGNOSIS — N2581 Secondary hyperparathyroidism of renal origin: Secondary | ICD-10-CM | POA: Diagnosis not present

## 2022-11-29 DIAGNOSIS — E8722 Chronic metabolic acidosis: Secondary | ICD-10-CM | POA: Diagnosis not present

## 2022-11-30 NOTE — Progress Notes (Signed)
Your labs are overall normal and stable at this time. We are still waiting on a urine sample to complete the urine testing and one other lab. Once we get the urine sample we can keep you updated with those results once they are available. For now please proceed with the recommendations discussed at your apt and available in your After Visit summary and follow up in a timely manner with your PCP

## 2022-12-01 LAB — COMPLETE METABOLIC PANEL WITH GFR
AG Ratio: 1.9 (calc) (ref 1.0–2.5)
ALT: 12 U/L (ref 6–29)
AST: 16 U/L (ref 10–35)
Albumin: 4.5 g/dL (ref 3.6–5.1)
Alkaline phosphatase (APISO): 60 U/L (ref 37–153)
BUN/Creatinine Ratio: 19 (calc) (ref 6–22)
BUN: 38 mg/dL — ABNORMAL HIGH (ref 7–25)
CO2: 23 mmol/L (ref 20–32)
Calcium: 10.2 mg/dL (ref 8.6–10.4)
Chloride: 100 mmol/L (ref 98–110)
Creat: 1.99 mg/dL — ABNORMAL HIGH (ref 0.60–0.95)
Globulin: 2.4 g/dL (calc) (ref 1.9–3.7)
Glucose, Bld: 105 mg/dL — ABNORMAL HIGH (ref 65–99)
Potassium: 4.6 mmol/L (ref 3.5–5.3)
Sodium: 135 mmol/L (ref 135–146)
Total Bilirubin: 0.5 mg/dL (ref 0.2–1.2)
Total Protein: 6.9 g/dL (ref 6.1–8.1)
eGFR: 24 mL/min/{1.73_m2} — ABNORMAL LOW (ref >=60)

## 2022-12-01 LAB — CBC WITH DIFFERENTIAL/PLATELET
Absolute Monocytes: 647 cells/uL (ref 200–950)
Basophils Absolute: 61 cells/uL (ref 0–200)
Basophils Relative: 1 %
Eosinophils Absolute: 49 cells/uL (ref 15–500)
Eosinophils Relative: 0.8 %
HCT: 40.2 % (ref 35.0–45.0)
Hemoglobin: 13.4 g/dL (ref 11.7–15.5)
Lymphs Abs: 1129 cells/uL (ref 850–3900)
MCH: 31.2 pg (ref 27.0–33.0)
MCHC: 33.3 g/dL (ref 32.0–36.0)
MCV: 93.7 fL (ref 80.0–100.0)
MPV: 11 fL (ref 7.5–12.5)
Monocytes Relative: 10.6 %
Neutro Abs: 4215 cells/uL (ref 1500–7800)
Neutrophils Relative %: 69.1 %
Platelets: 244 10*3/uL (ref 140–400)
RBC: 4.29 10*6/uL (ref 3.80–5.10)
RDW: 12.9 % (ref 11.0–15.0)
Total Lymphocyte: 18.5 %
WBC: 6.1 10*3/uL (ref 3.8–10.8)

## 2022-12-01 LAB — VITAMIN B1: Vitamin B1 (Thiamine): 13 nmol/L (ref 8–30)

## 2022-12-01 LAB — VITAMIN D 25 HYDROXY (VIT D DEFICIENCY, FRACTURES): Vit D, 25-Hydroxy: 43 ng/mL (ref 30–100)

## 2022-12-01 LAB — VITAMIN B12: Vitamin B-12: >2000 pg/mL — ABNORMAL HIGH (ref 200–1100)

## 2022-12-05 NOTE — Progress Notes (Signed)
Labs are normal/stable.

## 2022-12-10 DIAGNOSIS — H40013 Open angle with borderline findings, low risk, bilateral: Secondary | ICD-10-CM | POA: Diagnosis not present

## 2022-12-10 DIAGNOSIS — H26493 Other secondary cataract, bilateral: Secondary | ICD-10-CM | POA: Diagnosis not present

## 2022-12-10 DIAGNOSIS — H1045 Other chronic allergic conjunctivitis: Secondary | ICD-10-CM | POA: Diagnosis not present

## 2023-02-09 NOTE — Patient Instructions (Signed)
Be Involved in Caring For Your Health:  Taking Medications When medications are taken as directed, they can greatly improve your health. But if they are not taken as prescribed, they may not work. In some cases, not taking them correctly can be harmful. To help ensure your treatment remains effective and safe, understand your medications and how to take them. Bring your medications to each visit for review by your provider.  Your lab results, notes, and after visit summary will be available on My Chart. We strongly encourage you to use this feature. If lab results are abnormal the clinic will contact you with the appropriate steps. If the clinic does not contact you assume the results are satisfactory. You can always view your results on My Chart. If you have questions regarding your health or results, please contact the clinic during office hours. You can also ask questions on My Chart.  We at [facility] are grateful that you chose [place] to provide your care. We strive to provide evidence-based and compassionate care and are always looking for feedback. If you get a survey from the clinic please complete this so we can hear your opinions.  Chronic Kidney Disease, Adult Chronic kidney disease is when lasting damage happens to the kidneys slowly over a long time. The kidneys help to: Make pee (urine). Make hormones. Keep the right amount of fluids and chemicals in the body. Most often, this disease does not go away. You must take steps to help keep the kidney damage from getting worse. If steps are not taken, the kidneys might stop working forever. What are the causes? Diabetes. High blood pressure. Diseases that affect the heart and blood vessels. Other kidney diseases. Diseases of the body's disease-fighting system. A problem with the flow of pee. Infections of the organs that make pee, store it, and take it out of the body. Swelling or irritation of your blood vessels. What  increases the risk? Getting older. Having someone in your family who has kidney disease or kidney failure. Having a disease caused by genes. Taking medicines often that harm the kidneys. Being near or having contact with harmful substances. Being very overweight. Using tobacco now or in the past. What are the signs or symptoms? Feeling very tired. Having a swollen face, legs, ankles, or feet. Feeling like you may vomit or vomiting. Not feeling hungry. Being confused or not able to focus. Twitches and cramps in the leg muscles or other muscles. Dry, itchy skin. A taste of metal in your mouth. Making less pee, or making more pee. Shortness of breath. Trouble sleeping. You may also become anemic or get weak bones. Anemic means there is not enough red blood cells or hemoglobin in your blood. You may get symptoms slowly. You may not notice them until the kidney damage gets very bad. How is this treated? Often, there is no cure for this disease. Treatment can help with symptoms and help keep the disease from getting worse. You may need to: Avoid alcohol. Avoid foods that are high in salt, potassium, phosphorous, and protein. Take medicines for symptoms and to help control other conditions. Have dialysis. This treatment gets harmful waste out of your body. Treat other problems that cause your kidney disease or make it worse. Follow these instructions at home: Medicines Take over-the-counter and prescription medicines only as told by your doctor. Do not take any new medicines, vitamins, or supplements unless your doctor says it is okay. Lifestyle  Do not smoke or use  any products that contain nicotine or tobacco. If you need help quitting, ask your doctor. If you drink alcohol: Limit how much you use to: 0-1 drink a day for women who are not pregnant. 0-2 drinks a day for men. Know how much alcohol is in your drink. In the [place] one drink equals one 12 oz bottle of beer (355 mL), one  5 oz glass of wine (148 mL), or one 1 oz glass of hard liquor (44 mL). Stay at a healthy weight. If you need help losing weight, ask your doctor. General instructions  Follow instructions from your doctor about what you cannot eat or drink. Track your blood pressure at home. Tell your doctor about any changes. If you have diabetes, track your blood sugar. Exercise at least 30 minutes a day, 5 days a week. Keep your shots (vaccinations) up to date. Keep all follow-up visits. Where to find more information American Association of Kidney Patients: [employer].is [employer]: www.kidney.org American Kidney Fund: FightingMatch.com.ee Life Options: www.lifeoptions.org Kidney School: www.kidneyschool.org Contact a doctor if: Your symptoms get worse. You get new symptoms. Get help right away if: You get symptoms of end-stage kidney disease. These include: Headaches. Losing feeling in your hands or feet. Easy bruising. Having hiccups often. Chest pain. Shortness of breath. Lack of menstrual periods, in women. You have a fever. You make less pee than normal. You have pain or you bleed when you pee or poop. These symptoms may be an emergency. Get help right away. Call your local emergency services (911 in the [place] Do not wait to see if the symptoms will go away. Do not drive yourself to the hospital. Summary Chronic kidney disease is when lasting damage happens to the kidneys slowly over a long time. Causes of this disease include diabetes and high blood pressure. Often, there is no cure for this disease. Treatment can help symptoms and help keep the disease from getting worse. Treatment may involve lifestyle changes, medicines, and dialysis. This information is not intended to replace advice given to you by your health care provider. Make sure you discuss any questions you have with your health care provider. Document Revised: 08/26/2019 Document Reviewed: 08/26/2019 Elsevier  Patient Education  2024 [employer].

## 2023-02-11 ENCOUNTER — Encounter: Payer: Self-pay | Admitting: Nurse Practitioner

## 2023-02-11 ENCOUNTER — Ambulatory Visit (INDEPENDENT_AMBULATORY_CARE_PROVIDER_SITE_OTHER): Payer: Medicare HMO | Admitting: Nurse Practitioner

## 2023-02-11 VITALS — BP 124/62 | HR 64 | Temp 97.9°F | Wt 168.0 lb

## 2023-02-11 DIAGNOSIS — H900 Conductive hearing loss, bilateral: Secondary | ICD-10-CM | POA: Diagnosis not present

## 2023-02-11 DIAGNOSIS — E782 Mixed hyperlipidemia: Secondary | ICD-10-CM | POA: Diagnosis not present

## 2023-02-11 DIAGNOSIS — I10 Essential (primary) hypertension: Secondary | ICD-10-CM

## 2023-02-11 DIAGNOSIS — N184 Chronic kidney disease, stage 4 (severe): Secondary | ICD-10-CM

## 2023-02-11 DIAGNOSIS — N2581 Secondary hyperparathyroidism of renal origin: Secondary | ICD-10-CM

## 2023-02-11 DIAGNOSIS — I700 Atherosclerosis of aorta: Secondary | ICD-10-CM | POA: Diagnosis not present

## 2023-02-11 DIAGNOSIS — E039 Hypothyroidism, unspecified: Secondary | ICD-10-CM

## 2023-02-11 DIAGNOSIS — Z23 Encounter for immunization: Secondary | ICD-10-CM

## 2023-02-11 MED ORDER — LEVOTHYROXINE SODIUM 50 MCG PO TABS
50.0000 ug | ORAL_TABLET | Freq: Every day | ORAL | 4 refills | Status: DC
Start: 2023-02-11 — End: 2024-04-13

## 2023-02-11 MED ORDER — LEVOTHYROXINE SODIUM 50 MCG PO TABS
50.0000 ug | ORAL_TABLET | Freq: Every day | ORAL | 4 refills | Status: DC
Start: 2023-02-11 — End: 2023-02-11

## 2023-02-11 MED ORDER — FAMOTIDINE 20 MG PO TABS: ORAL_TABLET | ORAL | 4 refills | Status: DC

## 2023-02-11 NOTE — Assessment & Plan Note (Signed)
Chronic, ongoing.  Continue current medication regimen and adjust as needed.  Obtain TSH and Free T4 today.  Return in 6 months.

## 2023-02-11 NOTE — Assessment & Plan Note (Signed)
Chronic, stable.  BP at goal today in office on recheck.  Recommend she monitor BP at least a few mornings a week at home and document.  DASH diet at home.  Continue current medication regimen and adjust as needed.  Labs today: up to date with nephrology.  Return in 6 months.

## 2023-02-11 NOTE — Assessment & Plan Note (Signed)
Chronic, ongoing, followed by nephrology.  Continue this collaboration and medication regimen as recommended by them.  Recent labs and notes reviewed.

## 2023-02-11 NOTE — Assessment & Plan Note (Signed)
Chronic, stable.  Noted on imaging 01/27/2019.  Recommend to continue healthy diet focus. Continue to take Crestor daily for prevention.

## 2023-02-11 NOTE — Assessment & Plan Note (Signed)
Chronic, ongoing.  Is taking Crestor, continue this and adjust at needed.  Lipid panel today.

## 2023-02-11 NOTE — Progress Notes (Signed)
BP 124/62 (BP Location: Left Arm, Patient Position: Sitting, Cuff Size: Normal)   Pulse 64   Temp 97.9 F (36.6 C) (Oral)   Wt 168 lb (76.2 kg)   SpO2 98%   BMI 26.06 kg/m    Subjective:    Patient ID: Karla Little, female    DOB: 10-Oct-1933, 89 y.o.   MRN: [id number]  HPI: VALLI RIDENER is a 89 y.o. female  Chief Complaint  Patient presents with   Chronic Kidney Disease   Hyperlipidemia   Hypertension   Hypothyroidism   Having issues with hearing and would like to get hearing aides.    HYPERTENSION / HYPERLIPIDEMIA Taking Lisinopril, Hydralazine, and Amlodipine. Is taking Rosuvastatin daily without ADR. Had issues with Lipitor in past making her feel bad.  Aortic atherosclerosis noted on past imaging. Satisfied with current treatment? yes Duration of hypertension: chronic BP monitoring frequency: not checking BP range:  BP medication side effects: no Duration of hyperlipidemia: chronic Cholesterol medication side effects: no Cholesterol supplements: none Medication compliance: good compliance Aspirin: no Recent stressors: no Recurrent headaches: no Visual changes: no Palpitations: no Dyspnea: yes, only when wakes up is a little congested Chest pain: no Lower extremity edema: no Dizzy/lightheaded: occasional if stands up too fast  CHRONIC KIDNEY DISEASE Saw nephrology 11/29/22.  Visits with every 4 to 6 months. CKD status: stable Medications renally dose: yes Previous renal evaluation: yes Pneumovax:  Up to Date Influenza Vaccine:  Up To Date    Latest Ref Rng & Units 11/27/2022    2:19 PM 07/05/2021    8:27 AM 05/18/2021    9:12 PM  BMP  Glucose 65 - 99 mg/dL 191  478  295   BUN 7 - 25 mg/dL 38  30  37   Creatinine 0.60 - 0.95 mg/dL 6.21  3.08  6.57   BUN/Creat Ratio 6 - 22 (calc) 19  15    Sodium 135 - 146 mmol/L 135  138  128   Potassium 3.5 - 5.3 mmol/L 4.6  4.8  3.7   Chloride 98 - 110 mmol/L 100  101  100   CO2 20 - 32 mmol/L 23  21  20     Calcium 8.6 - 10.4 mg/dL 84.6  96.2  9.3     HYPOTHYROIDISM Continues Levothyroxine 50 MCG daily. Thyroid control status:stable Satisfied with current treatment? yes Medication side effects: no Medication compliance: good compliance Etiology of hypothyroidism:  Recent dose adjustment:no Fatigue: no Cold intolerance: no Heat intolerance: no Weight gain: no Weight loss: no Constipation: occasional Diarrhea/loose stools: occasional Palpitations: no Lower extremity edema: no Anxiety/depressed mood: no     02/11/2023    8:21 AM 11/27/2022    1:18 PM 08/09/2022    8:14 AM 06/26/2022   10:35 AM 02/08/2022    3:15 PM  Depression screen PHQ 2/9  Decreased Interest 0 0 0 0 0  Down, Depressed, Hopeless 0 0 0 0 0  PHQ - 2 Score 0 0 0 0 0  Altered sleeping 0 0 0  0  Tired, decreased energy 0 0 0  0  Change in appetite 0 0 0  0  Feeling bad or failure about yourself  0 0 0  0  Trouble concentrating 0 0 0  0  Moving slowly or fidgety/restless 0 0 0  0  Suicidal thoughts 0 0 0  0  PHQ-9 Score 0 0 0  0  Difficult doing work/chores Not difficult at all Not difficult  at all Not difficult at all  Not difficult at all       02/11/2023    8:22 AM 08/09/2022    8:14 AM 02/08/2022    3:16 PM 10/10/2021    1:23 PM  GAD 7 : Generalized Anxiety Score  Nervous, Anxious, on Edge 0 0 0 0  Control/stop worrying 0 0 0 0  Worry too much - different things 0 0 0 0  Trouble relaxing 0 0 0 0  Restless 0 0 0 0  Easily annoyed or irritable 0 0 0 0  Afraid - awful might happen 0 0 0 0  Total GAD 7 Score 0 0 0 0  Anxiety Difficulty Not difficult at all Not difficult at all Not difficult at all Not difficult at all   Relevant past medical, surgical, family and social history reviewed and updated as indicated. Interim medical history since our last visit reviewed. Allergies and medications reviewed and updated.  Review of Systems  Constitutional:  Negative for activity change, appetite change, diaphoresis,  fatigue and fever.  Respiratory:  Negative for cough, chest tightness and shortness of breath.   Cardiovascular:  Negative for chest pain, palpitations and leg swelling.  Gastrointestinal: Negative.   Endocrine: Negative for cold intolerance, heat intolerance and polyuria.  Neurological: Negative.   Psychiatric/Behavioral: Negative.     Per HPI unless specifically indicated above     Objective:    BP 124/62 (BP Location: Left Arm, Patient Position: Sitting, Cuff Size: Normal)   Pulse 64   Temp 97.9 F (36.6 C) (Oral)   Wt 168 lb (76.2 kg)   SpO2 98%   BMI 26.06 kg/m   Wt Readings from Last 3 Encounters:  02/11/23 168 lb (76.2 kg)  11/27/22 166 lb 6.4 oz (75.5 kg)  11/10/22 160 lb (72.6 kg)    Physical Exam Vitals and nursing note reviewed.  Constitutional:      General: She is awake. She is not in acute distress.    Appearance: She is well-developed and well-groomed. She is not ill-appearing or toxic-appearing.  HENT:     Head: Normocephalic.     Right Ear: Hearing, tympanic membrane, ear canal and external ear normal.     Left Ear: Hearing, tympanic membrane, ear canal and external ear normal.  Eyes:     General: Lids are normal.        Right eye: No discharge.        Left eye: No discharge.     Conjunctiva/sclera: Conjunctivae normal.     Pupils: Pupils are equal, round, and reactive to light.  Neck:     Thyroid: No thyromegaly.     Vascular: No carotid bruit or JVD.  Cardiovascular:     Rate and Rhythm: Normal rate and regular rhythm.     Heart sounds: Normal heart sounds. No murmur heard.    No gallop.  Pulmonary:     Effort: Pulmonary effort is normal.     Breath sounds: Normal breath sounds.  Abdominal:     General: Bowel sounds are normal.     Palpations: Abdomen is soft.  Musculoskeletal:     Cervical back: Normal range of motion and neck supple.     Right lower leg: No edema.     Left lower leg: No edema.  Lymphadenopathy:     Cervical: No cervical  adenopathy.  Skin:    General: Skin is warm and dry.  Neurological:     Mental Status: She is alert  and oriented to person, place, and time.  Psychiatric:        Attention and Perception: Attention normal.        Mood and Affect: Mood normal.        Behavior: Behavior normal. Behavior is cooperative.        Thought Content: Thought content normal.        Judgment: Judgment normal.    Results for orders placed or performed in visit on 11/27/22  CBC w/Diff/Platelet  Result Value Ref Range   WBC 6.1 3.8 - 10.8 Thousand/uL   RBC 4.29 3.80 - 5.10 Million/uL   Hemoglobin 13.4 11.7 - 15.5 g/dL   HCT 96.0 45.4 - 09.8 %   MCV 93.7 80.0 - 100.0 fL   MCH 31.2 27.0 - 33.0 pg   MCHC 33.3 32.0 - 36.0 g/dL   RDW 11.9 14.7 - 82.9 %   Platelets 244 140 - 400 Thousand/uL   MPV 11.0 7.5 - 12.5 fL   Neutro Abs 4,215 1,500 - 7,800 cells/uL   Lymphs Abs 1,129 850 - 3,900 cells/uL   Absolute Monocytes 647 200 - 950 cells/uL   Eosinophils Absolute 49 15 - 500 cells/uL   Basophils Absolute 61 0 - 200 cells/uL   Neutrophils Relative % 69.1 %   Total Lymphocyte 18.5 %   Monocytes Relative 10.6 %   Eosinophils Relative 0.8 %   Basophils Relative 1.0 %  COMPLETE METABOLIC PANEL WITH GFR  Result Value Ref Range   Glucose, Bld 105 (H) 65 - 99 mg/dL   BUN 38 (H) 7 - 25 mg/dL   Creat 5.62 (H) 1.30 - 0.95 mg/dL   eGFR 24 (L) > OR = 60 mL/min/1.45m2   BUN/Creatinine Ratio 19 6 - 22 (calc)   Sodium 135 135 - 146 mmol/L   Potassium 4.6 3.5 - 5.3 mmol/L   Chloride 100 98 - 110 mmol/L   CO2 23 20 - 32 mmol/L   Calcium 10.2 8.6 - 10.4 mg/dL   Total Protein 6.9 6.1 - 8.1 g/dL   Albumin 4.5 3.6 - 5.1 g/dL   Globulin 2.4 1.9 - 3.7 g/dL (calc)   AG Ratio 1.9 1.0 - 2.5 (calc)   Total Bilirubin 0.5 0.2 - 1.2 mg/dL   Alkaline phosphatase (APISO) 60 37 - 153 U/L   AST 16 10 - 35 U/L   ALT 12 6 - 29 U/L  B12  Result Value Ref Range   Vitamin B-12 >2,000 (H) 200 - 1,100 pg/mL  Vitamin D (25 hydroxy)   Result Value Ref Range   Vit D, 25-Hydroxy 43 30 - 100 ng/mL  Vitamin B1  Result Value Ref Range   Vitamin B1 (Thiamine) 13 8 - 30 nmol/L      Assessment & Plan:   Problem List Items Addressed This Visit       Cardiovascular and Mediastinum   Aortic atherosclerosis (HCC)    Chronic, stable.  Noted on imaging 01/27/2019.  Recommend to continue healthy diet focus. Continue to take Crestor daily for prevention.      Essential hypertension    Chronic, stable.  BP at goal today in office on recheck.  Recommend she monitor BP at least a few mornings a week at home and document.  DASH diet at home.  Continue current medication regimen and adjust as needed.  Labs today: up to date with nephrology.  Return in 6 months.         Endocrine   Hypothyroidism  Chronic, ongoing.  Continue current medication regimen and adjust as needed.  Obtain TSH and Free T4 today.  Return in 6 months.      Relevant Medications   levothyroxine (SYNTHROID) 50 MCG tablet   Other Relevant Orders   T4, free   TSH   Secondary hyperparathyroidism of renal origin (HCC)    Chronic, ongoing.  Followed by nephrology, continue current medication regimen as recommended by them.  Recent labs and notes reviewed.        Genitourinary   Chronic kidney disease, stage 4, severely decreased GFR (HCC) - Primary    Chronic, ongoing, followed by nephrology.  Continue this collaboration and medication regimen as recommended by them.  Recent labs and notes reviewed.        Other   Hyperlipidemia    Chronic, ongoing.  Is taking Crestor, continue this and adjust at needed.  Lipid panel today.      Relevant Orders   Lipid Panel w/o Chol/HDL Ratio   Other Visit Diagnoses     Conductive hearing loss, bilateral       Referral to audiology for testing per request.   Relevant Orders   Ambulatory referral to Audiology   Flu vaccine need       Flu vaccine due and provided in office today.   Relevant Orders   Flu  Vaccine Trivalent High Dose (Fluad) (Completed)        Follow up plan: Return in about 6 months (around 08/11/2023) for HTN/HLD, CKD, THYROID.

## 2023-02-11 NOTE — Assessment & Plan Note (Signed)
Chronic, ongoing.  Followed by nephrology, continue current medication regimen as recommended by them.  Recent labs and notes reviewed.

## 2023-02-12 LAB — T4, FREE: Free T4: 1.3 ng/dL (ref 0.82–1.77)

## 2023-02-12 LAB — LIPID PANEL W/O CHOL/HDL RATIO
Cholesterol, Total: 172 mg/dL (ref 100–199)
HDL: 83 mg/dL (ref >39)
LDL Chol Calc (NIH): 69 mg/dL (ref 0–99)
Triglycerides: 116 mg/dL (ref 0–149)
VLDL Cholesterol Cal: 20 mg/dL (ref 5–40)

## 2023-02-12 LAB — TSH: TSH: 2.44 u[IU]/mL (ref 0.450–4.500)

## 2023-02-12 NOTE — Progress Notes (Signed)
Contacted via MyChart   Good morning Taylani, your labs have returned and remain nice and stable.  No medication changes needed.  Any questions? Keep being amazing!!  Thank you for allowing me to participate in your care.  I appreciate you. Kindest regards, Berneice Zettlemoyer

## 2023-03-27 DIAGNOSIS — N184 Chronic kidney disease, stage 4 (severe): Secondary | ICD-10-CM | POA: Diagnosis not present

## 2023-04-01 DIAGNOSIS — N1831 Chronic kidney disease, stage 3a: Secondary | ICD-10-CM | POA: Diagnosis not present

## 2023-04-01 DIAGNOSIS — E8722 Chronic metabolic acidosis: Secondary | ICD-10-CM | POA: Diagnosis not present

## 2023-04-01 DIAGNOSIS — I10 Essential (primary) hypertension: Secondary | ICD-10-CM | POA: Diagnosis not present

## 2023-04-01 DIAGNOSIS — N2581 Secondary hyperparathyroidism of renal origin: Secondary | ICD-10-CM | POA: Diagnosis not present

## 2023-06-12 ENCOUNTER — Telehealth: Payer: Self-pay | Admitting: Nurse Practitioner

## 2023-06-12 NOTE — Telephone Encounter (Signed)
 Copied from CRM [id number]. Topic: Medicare AWV >> Jun 12, 2023 10:51 AM Nathanel DEL wrote: Reason for CRM: Called LVM 06/12/2023 to schedule AWV. Please schedule office or virtual visits.  Nathanel Paschal; Care Guide Ambulatory Clinical Support [place] [facility] Direct Dial: [phone]

## 2023-08-05 ENCOUNTER — Ambulatory Visit: Admitting: Nurse Practitioner

## 2023-08-05 ENCOUNTER — Encounter: Payer: Self-pay | Admitting: Nurse Practitioner

## 2023-08-05 VITALS — BP 132/66 | HR 67 | Temp 97.8°F | Ht 67.0 in | Wt 167.2 lb

## 2023-08-05 DIAGNOSIS — N184 Chronic kidney disease, stage 4 (severe): Secondary | ICD-10-CM

## 2023-08-05 DIAGNOSIS — H6120 Impacted cerumen, unspecified ear: Secondary | ICD-10-CM | POA: Insufficient documentation

## 2023-08-05 DIAGNOSIS — I700 Atherosclerosis of aorta: Secondary | ICD-10-CM | POA: Diagnosis not present

## 2023-08-05 DIAGNOSIS — E039 Hypothyroidism, unspecified: Secondary | ICD-10-CM | POA: Diagnosis not present

## 2023-08-05 DIAGNOSIS — N2581 Secondary hyperparathyroidism of renal origin: Secondary | ICD-10-CM | POA: Diagnosis not present

## 2023-08-05 DIAGNOSIS — I10 Essential (primary) hypertension: Secondary | ICD-10-CM | POA: Diagnosis not present

## 2023-08-05 DIAGNOSIS — H6123 Impacted cerumen, bilateral: Secondary | ICD-10-CM

## 2023-08-05 DIAGNOSIS — E782 Mixed hyperlipidemia: Secondary | ICD-10-CM

## 2023-08-05 DIAGNOSIS — R42 Dizziness and giddiness: Secondary | ICD-10-CM | POA: Insufficient documentation

## 2023-08-05 MED ORDER — LISINOPRIL 10 MG PO TABS: 10.0000 mg | ORAL_TABLET | Freq: Every day | ORAL | 4 refills | Status: AC

## 2023-08-05 MED ORDER — HYDRALAZINE HCL 25 MG PO TABS: 25.0000 mg | ORAL_TABLET | Freq: Three times a day (TID) | ORAL | 4 refills | Status: DC

## 2023-08-05 MED ORDER — AMLODIPINE BESYLATE 10 MG PO TABS: 10.0000 mg | ORAL_TABLET | Freq: Every day | ORAL | 4 refills | Status: AC

## 2023-08-05 MED ORDER — ROSUVASTATIN CALCIUM 10 MG PO TABS: 10.0000 mg | ORAL_TABLET | Freq: Every day | ORAL | 4 refills | Status: AC

## 2023-08-05 NOTE — Assessment & Plan Note (Signed)
Chronic, ongoing.  Continue current medication regimen and adjust as needed.  Thyroid labs up to date. 

## 2023-08-05 NOTE — Patient Instructions (Signed)
 Dizziness Dizziness is a common problem. It makes you feel unsteady or light-headed. You may feel like you're about to faint. Dizziness can lead to getting hurt if you stumble or fall. It's more common to feel dizzy if you're an older adult. Many things can cause you to feel dizzy. These include: Medicines. Dehydration. This is when there's not enough water in your body. Illness. Follow these instructions at home: Eating and drinking  Drink enough fluid to keep your pee (urine) pale yellow. This helps keep you from getting dehydrated. Try to drink more clear fluids, such as water. Do not drink alcohol. Try to limit how much caffeine you take in. Try to limit how much salt, also called sodium, you take in. Activity Try not to make quick movements. Stand up slowly from sitting in a chair. Steady yourself until you feel okay. In the morning, first sit up on the side of the bed. When you feel okay, hold onto something and slowly stand up. Do this until you know that your balance is okay. If you need to stand in one place for a long time, move your legs often. Tighten and relax the muscles in your legs while you're standing. Do not drive or use machines if you feel dizzy. Avoid bending down if you feel dizzy. Place items in your home so you can reach them without leaning over. Lifestyle Do not smoke, vape, or use products with nicotine or tobacco in them. If you need help quitting, talk with your health care provider. Try to lower your stress level. You can do this by using methods like yoga or meditation. Talk with your provider if you need help. General instructions Watch your dizziness for any changes. Take your medicines only as told by your provider. Talk with your provider if you think you're dizzy because of a medicine you're taking. Tell a friend or a family member that you're feeling dizzy. If they spot any changes in your behavior, have them call your provider. Contact a health care  provider if: Your dizziness doesn't go away, or you have new symptoms. Your dizziness gets worse. You feel like you may vomit. You have trouble hearing. You have a fever. You have neck pain or a stiff neck. You fall or get hurt. Get help right away if: You vomit each time you eat or drink. You have watery poop and can't eat or drink. You have trouble talking, walking, swallowing, or using your arms, hands, or legs. You feel very weak. You're bleeding. You're not thinking clearly, or you have trouble forming sentences. A friend or family member may spot this. Your vision changes, or you get a very bad headache. These symptoms may be an emergency. Call 911 right away. Do not wait to see if the symptoms will go away. Do not drive yourself to the hospital. This information is not intended to replace advice given to you by your health care provider. Make sure you discuss any questions you have with your health care provider. Document Revised: 02/21/2023 Document Reviewed: 07/05/2022 Elsevier Patient Education  2024 [employer].

## 2023-08-05 NOTE — Assessment & Plan Note (Signed)
 Chronic, stable.  BP at goal today in office.  Discussed orthostatic hypotension, suspect this is main issue with dizziness.  Recommend she get up slowly from sitting + wear compression hose on during day and off at night.  Recommend she monitor BP at least a few mornings a week at home and document.  DASH diet at home.  Continue current medication regimen and adjust as needed.  Labs today: up to date with nephrology.  Return in 6 months.

## 2023-08-05 NOTE — Assessment & Plan Note (Signed)
 Chronic, ongoing.  Is taking Crestor, continue this and adjust at needed.  Lipid panel up to date, check next visit.

## 2023-08-05 NOTE — Assessment & Plan Note (Signed)
Chronic, stable.  Noted on imaging 01/27/2019.  Recommend to continue healthy diet focus. Continue to take Crestor daily for prevention.

## 2023-08-05 NOTE — Assessment & Plan Note (Signed)
 Bilateral -- R>L.  Suspect this main issue with hearing loss and possibly some of the dizziness.  Ears irrigated by nursing staff with good outcomes.  Clear and able to view TM after procedure.

## 2023-08-05 NOTE — Progress Notes (Signed)
 BP 132/66 (BP Location: Left Arm, Patient Position: Sitting, Cuff Size: Normal)   Pulse 67   Temp 97.8 F (36.6 C) (Oral)   Ht 5\' 7"  (1.702 m)   Wt 167 lb 3.2 oz (75.8 kg)   SpO2 98%   BMI 26.19 kg/m    Subjective:    Patient ID: Karla Little, female    DOB: 1933/08/06, 89 y.o.   MRN: [id number]  HPI: Karla Little is a 89 y.o. female  Chief Complaint  Patient presents with   Cerumen Impaction    Patient was wondering if she could get her wax   Dizziness    Patient states when she stand up she gets dizziness its been going on for 3 or 4 months   DIZZINESS Started a couple months ago.  Notices it most when first stands up.  Thinks may have wax build up in ears, ears stopped up.  No falls. Duration: months Description of symptoms: ill-defined Duration of episode: seconds Dizziness frequency: no history of the same Provoking factors: changing position Aggravating factors: changing position Triggered by rolling over in bed: no Triggered by bending over: no Aggravated by head movement: no Aggravated by exertion, coughing, loud noises: no Recent head injury: no Recent or current viral symptoms: no History of vasovagal episodes: no Nausea: no Vomiting: no Tinnitus:  a little bit Hearing loss: yes Aural fullness: no Headache: no Photophobia/phonophobia: no Unsteady gait: no Postural instability: no Diplopia, dysarthria, dysphagia or weakness: no Related to exertion: no Pallor: no Diaphoresis: no Dyspnea: no Chest pain: no   HYPERTENSION / HYPERLIPIDEMIA Taking Lisinopril, Hydralazine, and Amlodipine. Is taking Rosuvastatin daily without ADR. Had issues with Lipitor in past making her feel bad.  Aortic atherosclerosis noted on past imaging.  Satisfied with current treatment? yes Duration of hypertension: chronic BP monitoring frequency: not checking BP range:  BP medication side effects: no Duration of hyperlipidemia: chronic Cholesterol medication side  effects: no Cholesterol supplements: none Medication compliance: good compliance Aspirin: no Recent stressors: no Recurrent headaches: no Visual changes: no Palpitations: no Dyspnea: no Chest pain: no Lower extremity edema: no Dizzy/lightheaded: yes as above  CHRONIC KIDNEY DISEASE Last saw nephrology on 04/01/23. CKD status: stable Medications renally dose: yes Previous renal evaluation: yes Pneumovax:  Up to Date Influenza Vaccine:  Up to Date   HYPOTHYROIDISM Continues Levothyroxine 50 MCG. Thyroid control status:stable Satisfied with current treatment? yes Medication side effects: no Medication compliance: good compliance Etiology of hypothyroidism:  Recent dose adjustment:no Fatigue: no Cold intolerance: no Heat intolerance: no Weight gain: no Weight loss: no Constipation: yes -- prune juice often helps Diarrhea/loose stools: no Palpitations: no Lower extremity edema: no Anxiety/depressed mood: no   Relevant past medical, surgical, family and social history reviewed and updated as indicated. Interim medical history since our last visit reviewed. Allergies and medications reviewed and updated.  Review of Systems  Constitutional:  Negative for activity change, appetite change, diaphoresis, fatigue and fever.  HENT:  Positive for hearing loss. Negative for ear discharge and ear pain.   Respiratory:  Negative for cough, chest tightness and shortness of breath.   Cardiovascular:  Negative for chest pain, palpitations and leg swelling.  Gastrointestinal: Negative.   Endocrine: Negative for cold intolerance and heat intolerance.  Neurological:  Positive for dizziness. Negative for seizures, syncope, weakness, light-headedness, numbness and headaches.  Psychiatric/Behavioral: Negative.      Per HPI unless specifically indicated above     Objective:    BP 132/66 (  BP Location: Left Arm, Patient Position: Sitting, Cuff Size: Normal)   Pulse 67   Temp 97.8 F  (36.6 C) (Oral)   Ht 5\' 7"  (1.702 m)   Wt 167 lb 3.2 oz (75.8 kg)   SpO2 98%   BMI 26.19 kg/m   Wt Readings from Last 3 Encounters:  08/05/23 167 lb 3.2 oz (75.8 kg)  02/11/23 168 lb (76.2 kg)  11/27/22 166 lb 6.4 oz (75.5 kg)    Physical Exam Vitals and nursing note reviewed.  Constitutional:      General: She is awake. She is not in acute distress.    Appearance: She is well-developed and well-groomed. She is not ill-appearing or toxic-appearing.  HENT:     Head: Normocephalic.     Right Ear: Hearing, ear canal and external ear normal. There is impacted cerumen.     Left Ear: Hearing, ear canal and external ear normal. There is impacted cerumen.     Ears:     Comments: R>L cerumen build up.  Slightly hard of hearing. Eyes:     General: Lids are normal.        Right eye: No discharge.        Left eye: No discharge.     Conjunctiva/sclera: Conjunctivae normal.     Pupils: Pupils are equal, round, and reactive to light.  Neck:     Thyroid: No thyromegaly.     Vascular: No carotid bruit or JVD.  Cardiovascular:     Rate and Rhythm: Normal rate and regular rhythm.     Heart sounds: Normal heart sounds. No murmur heard.    No gallop.  Pulmonary:     Effort: Pulmonary effort is normal.     Breath sounds: Normal breath sounds.  Abdominal:     General: Bowel sounds are normal.     Palpations: Abdomen is soft.  Musculoskeletal:     Cervical back: Normal range of motion and neck supple.     Right lower leg: No edema.     Left lower leg: No edema.  Lymphadenopathy:     Cervical: No cervical adenopathy.  Skin:    General: Skin is warm and dry.  Neurological:     Mental Status: She is alert and oriented to person, place, and time.     Cranial Nerves: Cranial nerves 2-12 are intact.     Motor: Motor function is intact.     Coordination: Coordination is intact.     Gait: Gait is intact.     Deep Tendon Reflexes: Reflexes are normal and symmetric.     Reflex Scores:       Brachioradialis reflexes are 2+ on the right side and 2+ on the left side.      Patellar reflexes are 2+ on the right side and 2+ on the left side.    Comments: Strength BUE and BLE 5/5.  Psychiatric:        Attention and Perception: Attention normal.        Mood and Affect: Mood normal.        Behavior: Behavior normal. Behavior is cooperative.        Thought Content: Thought content normal.        Judgment: Judgment normal.    Results for orders placed or performed in visit on 02/11/23  T4, free   Collection Time: 02/11/23  8:46 AM  Result Value Ref Range   Free T4 1.30 0.82 - 1.77 ng/dL  TSH   Collection Time:  02/11/23  8:46 AM  Result Value Ref Range   TSH 2.440 0.450 - 4.500 uIU/mL  Lipid Panel w/o Chol/HDL Ratio   Collection Time: 02/11/23  8:46 AM  Result Value Ref Range   Cholesterol, Total 172 100 - 199 mg/dL   Triglycerides 604 0 - 149 mg/dL   HDL 83 >54 mg/dL   VLDL Cholesterol Cal 20 5 - 40 mg/dL   LDL Chol Calc (NIH) 69 0 - 99 mg/dL      Assessment & Plan:   Problem List Items Addressed This Visit       Cardiovascular and Mediastinum   Aortic atherosclerosis (HCC) - Primary   Chronic, stable.  Noted on imaging 01/27/2019.  Recommend to continue healthy diet focus. Continue to take Crestor daily for prevention.      Relevant Medications   rosuvastatin (CRESTOR) 10 MG tablet   lisinopril (ZESTRIL) 10 MG tablet   hydrALAZINE (APRESOLINE) 25 MG tablet   amLODipine (NORVASC) 10 MG tablet   Essential hypertension   Chronic, stable.  BP at goal today in office.  Discussed orthostatic hypotension, suspect this is main issue with dizziness.  Recommend she get up slowly from sitting + wear compression hose on during day and off at night.  Recommend she monitor BP at least a few mornings a week at home and document.  DASH diet at home.  Continue current medication regimen and adjust as needed.  Labs today: up to date with nephrology.  Return in 6 months.        Relevant Medications   rosuvastatin (CRESTOR) 10 MG tablet   lisinopril (ZESTRIL) 10 MG tablet   hydrALAZINE (APRESOLINE) 25 MG tablet   amLODipine (NORVASC) 10 MG tablet     Endocrine   Hypothyroidism   Chronic, ongoing.  Continue current medication regimen and adjust as needed.  Thyroid labs up to date.      Secondary hyperparathyroidism of renal origin (HCC)   Chronic, ongoing.  Followed by nephrology, continue current medication regimen as recommended by them.  Recent labs and notes reviewed.        Nervous and Auditory   Cerumen impaction   Bilateral -- R>L.  Suspect this main issue with hearing loss and possibly some of the dizziness.  Ears irrigated by nursing staff with good outcomes.  Clear and able to view TM after procedure.        Genitourinary   Chronic kidney disease, stage 4, severely decreased GFR (HCC)   Chronic, ongoing, followed by nephrology.  Continue this collaboration and medication regimen as recommended by them.  Recent labs and notes reviewed.  No labs today.      Relevant Medications   lisinopril (ZESTRIL) 10 MG tablet   amLODipine (NORVASC) 10 MG tablet     Other   Hyperlipidemia   Chronic, ongoing.  Is taking Crestor, continue this and adjust at needed.  Lipid panel up to date, check next visit.      Relevant Medications   rosuvastatin (CRESTOR) 10 MG tablet   lisinopril (ZESTRIL) 10 MG tablet   hydrALAZINE (APRESOLINE) 25 MG tablet   amLODipine (NORVASC) 10 MG tablet   Orthostatic dizziness   For months, based on exam and HPI suspect more orthostatic changes.  Neuro exam reassuring.  Recommend to ensure good hydration daily.  Take time changing positions, do not rush and move.  Wear compression hose at home, on during day and off at night.  If worsening or ongoing return to office.  May need to reduce BP medication in future.        Follow up plan: Return in about 6 months (around 02/05/2024) for HTN/HLD, Hypothyroid, CKD.

## 2023-08-05 NOTE — Assessment & Plan Note (Signed)
 For months, based on exam and HPI suspect more orthostatic changes.  Neuro exam reassuring.  Recommend to ensure good hydration daily.  Take time changing positions, do not rush and move.  Wear compression hose at home, on during day and off at night.  If worsening or ongoing return to office.  May need to reduce BP medication in future.

## 2023-08-05 NOTE — Assessment & Plan Note (Signed)
Chronic, ongoing.  Followed by nephrology, continue current medication regimen as recommended by them.  Recent labs and notes reviewed.

## 2023-08-05 NOTE — Assessment & Plan Note (Addendum)
 Chronic, ongoing, followed by nephrology.  Continue this collaboration and medication regimen as recommended by them.  Recent labs and notes reviewed.  No labs today.

## 2023-08-06 DIAGNOSIS — I10 Essential (primary) hypertension: Secondary | ICD-10-CM | POA: Diagnosis not present

## 2023-08-06 DIAGNOSIS — N1831 Chronic kidney disease, stage 3a: Secondary | ICD-10-CM | POA: Diagnosis not present

## 2023-08-06 NOTE — Addendum Note (Signed)
 Addended by: Aura Dials T on: 08/06/2023 11:50 AM   Modules accepted: Level of Service

## 2023-08-12 ENCOUNTER — Ambulatory Visit: Payer: Self-pay | Admitting: Nurse Practitioner

## 2023-08-12 DIAGNOSIS — N2581 Secondary hyperparathyroidism of renal origin: Secondary | ICD-10-CM | POA: Diagnosis not present

## 2023-08-12 DIAGNOSIS — N184 Chronic kidney disease, stage 4 (severe): Secondary | ICD-10-CM | POA: Diagnosis not present

## 2023-08-12 DIAGNOSIS — I10 Essential (primary) hypertension: Secondary | ICD-10-CM | POA: Diagnosis not present

## 2023-08-12 DIAGNOSIS — E8722 Chronic metabolic acidosis: Secondary | ICD-10-CM | POA: Diagnosis not present

## 2023-11-29 DIAGNOSIS — Z0100 Encounter for examination of eyes and vision without abnormal findings: Secondary | ICD-10-CM | POA: Diagnosis not present

## 2023-12-23 DIAGNOSIS — N2581 Secondary hyperparathyroidism of renal origin: Secondary | ICD-10-CM | POA: Diagnosis not present

## 2023-12-23 DIAGNOSIS — N1831 Chronic kidney disease, stage 3a: Secondary | ICD-10-CM | POA: Diagnosis not present

## 2023-12-23 DIAGNOSIS — I10 Essential (primary) hypertension: Secondary | ICD-10-CM | POA: Diagnosis not present

## 2023-12-25 DIAGNOSIS — I10 Essential (primary) hypertension: Secondary | ICD-10-CM | POA: Diagnosis not present

## 2023-12-25 DIAGNOSIS — N184 Chronic kidney disease, stage 4 (severe): Secondary | ICD-10-CM | POA: Diagnosis not present

## 2023-12-25 DIAGNOSIS — N2581 Secondary hyperparathyroidism of renal origin: Secondary | ICD-10-CM | POA: Diagnosis not present

## 2023-12-25 DIAGNOSIS — E8722 Chronic metabolic acidosis: Secondary | ICD-10-CM | POA: Diagnosis not present

## 2024-02-02 NOTE — Patient Instructions (Signed)

## 2024-02-05 ENCOUNTER — Encounter: Payer: Self-pay | Admitting: Nurse Practitioner

## 2024-02-05 ENCOUNTER — Ambulatory Visit (INDEPENDENT_AMBULATORY_CARE_PROVIDER_SITE_OTHER): Admitting: Nurse Practitioner

## 2024-02-05 VITALS — BP 130/62 | HR 71 | Temp 97.7°F | Ht 67.0 in | Wt 167.0 lb

## 2024-02-05 DIAGNOSIS — E039 Hypothyroidism, unspecified: Secondary | ICD-10-CM | POA: Diagnosis not present

## 2024-02-05 DIAGNOSIS — I10 Essential (primary) hypertension: Secondary | ICD-10-CM

## 2024-02-05 DIAGNOSIS — L03115 Cellulitis of right lower limb: Secondary | ICD-10-CM | POA: Diagnosis not present

## 2024-02-05 DIAGNOSIS — I700 Atherosclerosis of aorta: Secondary | ICD-10-CM

## 2024-02-05 DIAGNOSIS — E782 Mixed hyperlipidemia: Secondary | ICD-10-CM

## 2024-02-05 DIAGNOSIS — N2581 Secondary hyperparathyroidism of renal origin: Secondary | ICD-10-CM | POA: Diagnosis not present

## 2024-02-05 DIAGNOSIS — N184 Chronic kidney disease, stage 4 (severe): Secondary | ICD-10-CM | POA: Diagnosis not present

## 2024-02-05 MED ORDER — DOXYCYCLINE HYCLATE 100 MG PO TABS: 100.0000 mg | ORAL_TABLET | Freq: Two times a day (BID) | ORAL | 0 refills | Status: AC

## 2024-02-05 MED ORDER — MUPIROCIN 2 % EX OINT: 1.0000 | TOPICAL_OINTMENT | Freq: Two times a day (BID) | CUTANEOUS | 4 refills | Status: DC

## 2024-02-05 NOTE — Assessment & Plan Note (Signed)
Chronic, ongoing.  Continue current medication regimen and adjust as needed.  Thyroid labs today. 

## 2024-02-05 NOTE — Assessment & Plan Note (Signed)
Chronic, ongoing.  Followed by nephrology, continue current medication regimen as recommended by them.  Recent labs and notes reviewed.

## 2024-02-05 NOTE — Assessment & Plan Note (Signed)
 Chronic, stable.  BP at goal today in office.  Discussed orthostatic hypotension, suspect this is main issue with dizziness.  Recommend she get up slowly from sitting + wear compression hose on during day and off at night.  Recommend she monitor BP at least a few mornings a week at home and document.  DASH diet at home.  Continue current medication regimen and adjust as needed.  Labs today: up to date with nephrology.  Return in 6 months.

## 2024-02-05 NOTE — Assessment & Plan Note (Signed)
Chronic, stable.  Noted on imaging 01/27/2019.  Recommend to continue healthy diet focus. Continue to take Crestor daily for prevention.

## 2024-02-05 NOTE — Assessment & Plan Note (Signed)
 Acute, after injury 2 1/2 to 3 weeks ago with tin can landing on ankle. Needs Tdap or Td, but not available in office today. Will plan on giving next week at visit once in stock.  Start Doxycycline  100 MG BID for 7 days + Mupirocin  to apply to wound 2-3 times daily.  Cleanse wound with wound cleanser.  To take abx with probiotic yogurt to help prevent GI side effects. Advised her to check temp daily and draw circle around outside of redness to monitor and if any extension past line or fever she is to immediately alert PCP.

## 2024-02-05 NOTE — Progress Notes (Signed)
 BP 130/62 (BP Location: Left Arm, Patient Position: Sitting)   Pulse 71   Temp 97.7 F (36.5 C) (Oral)   Ht 5' 7 (1.702 m)   Wt 167 lb (75.8 kg)   SpO2 98%   BMI 26.16 kg/m    Subjective:    Patient ID: Karla Little, female    DOB: 1934-05-13, 89 y.o.   MRN: [id number]  HPI: Karla Little is a 89 y.o. female  Chief Complaint  Patient presents with   Follow-up   Wound Infection    R ankle   SKIN INFECTION Happened 2 1/2 to 3 weeks ago.  Was sitting down to count her change, the change tin can was heavy.  It slipped out of her hand then hit her medial right ankle.  Caused a wound to area, skin pulled back.  Was bleeding a lot at first, she stopped this.   Duration: weeks Location: right inner ankle History of trauma in area: no Pain: no Quality: none Severity: none Redness: yes Swelling: yes Oozing: no Pus: no Fevers: no Nausea/vomiting: no Status: fluctuating Treatments attempted:triple abx ointment, leaving open to air  Tetanus: Not UTD   HYPERTENSION / HYPERLIPIDEMIA Continues Lisinopril , Hydralazine , Amlodipine , Rosuvastatin .  Tolerating all medications.  Aortic atherosclerosis noted on past imaging. Satisfied with current treatment? yes Duration of hypertension: chronic BP monitoring frequency: not checking BP range:  BP medication side effects: no Duration of hyperlipidemia: chronic Cholesterol medication side effects: no Cholesterol supplements: none Medication compliance: good compliance Aspirin: no Recent stressors: no Recurrent headaches: no Visual changes: no Palpitations: no Dyspnea: occasional Chest pain: no Lower extremity edema: occasional Dizzy/lightheaded: if stands up too fast  CHRONIC KIDNEY DISEASE Had visit with Dr. Marcelino last on 12/25/23. CKD status: stable Medications renally dose: yes Previous renal evaluation: yes Pneumovax:  Up to Date Influenza Vaccine:  Up To Date    Latest Ref Rng & Units 11/27/2022    2:19 PM  07/05/2021    8:27 AM 05/18/2021    9:12 PM  BMP  Glucose 65 - 99 mg/dL 894  899  875   BUN 7 - 25 mg/dL 38  30  37   Creatinine 0.60 - 0.95 mg/dL 8.00  7.98  8.22   BUN/Creat Ratio 6 - 22 (calc) 19  15    Sodium 135 - 146 mmol/L 135  138  128   Potassium 3.5 - 5.3 mmol/L 4.6  4.8  3.7   Chloride 98 - 110 mmol/L 100  101  100   CO2 20 - 32 mmol/L 23  21  20    Calcium  8.6 - 10.4 mg/dL 89.7  89.9  9.3     HYPOTHYROIDISM Taking Levothyroxine  50 MCG daily. Thyroid  control status:stable Satisfied with current treatment? yes Medication side effects: no Medication compliance: good compliance Etiology of hypothyroidism:  Recent dose adjustment:no Fatigue: no Cold intolerance: at baseline Heat intolerance: no Weight gain: no Weight loss: no Constipation: occasional Diarrhea/loose stools: occasional Palpitations: no Lower extremity edema: no Anxiety/depressed mood: no     08/05/2023   11:03 AM 02/11/2023    8:21 AM 11/27/2022    1:18 PM 08/09/2022    8:14 AM 06/26/2022   10:35 AM  Depression screen PHQ 2/9  Decreased Interest 0 0 0 0 0  Down, Depressed, Hopeless 0 0 0 0 0  PHQ - 2 Score 0 0 0 0 0  Altered sleeping 0 0 0 0   Tired, decreased energy 0 0 0 0  Change in appetite 0 0 0 0   Feeling bad or failure about yourself  0 0 0 0   Trouble concentrating 0 0 0 0   Moving slowly or fidgety/restless 0 0 0 0   Suicidal thoughts 0 0 0 0   PHQ-9 Score 0 0 0 0   Difficult doing work/chores Not difficult at all Not difficult at all Not difficult at all Not difficult at all        08/05/2023   11:04 AM 02/11/2023    8:22 AM 08/09/2022    8:14 AM 02/08/2022    3:16 PM  GAD 7 : Generalized Anxiety Score  Nervous, Anxious, on Edge 0 0 0 0  Control/stop worrying 0 0 0 0  Worry too much - different things 0 0 0 0  Trouble relaxing 0 0 0 0  Restless 0 0 0 0  Easily annoyed or irritable 0 0 0 0  Afraid - awful might happen 0 0 0 0  Total GAD 7 Score 0 0 0 0  Anxiety Difficulty Not difficult  at all Not difficult at all Not difficult at all Not difficult at all   Relevant past medical, surgical, family and social history reviewed and updated as indicated. Interim medical history since our last visit reviewed. Allergies and medications reviewed and updated.  Review of Systems  Constitutional:  Negative for activity change, appetite change, diaphoresis, fatigue and fever.  Respiratory:  Negative for cough, chest tightness and shortness of breath.   Cardiovascular:  Negative for chest pain, palpitations and leg swelling.  Gastrointestinal: Negative.   Endocrine: Negative for cold intolerance, heat intolerance and polyuria.  Skin:  Positive for wound.  Neurological: Negative.   Psychiatric/Behavioral: Negative.     Per HPI unless specifically indicated above     Objective:    BP 130/62 (BP Location: Left Arm, Patient Position: Sitting)   Pulse 71   Temp 97.7 F (36.5 C) (Oral)   Ht 5' 7 (1.702 m)   Wt 167 lb (75.8 kg)   SpO2 98%   BMI 26.16 kg/m   Wt Readings from Last 3 Encounters:  02/05/24 167 lb (75.8 kg)  08/05/23 167 lb 3.2 oz (75.8 kg)  02/11/23 168 lb (76.2 kg)    Physical Exam Vitals and nursing note reviewed.  Constitutional:      General: She is awake. She is not in acute distress.    Appearance: She is well-developed and well-groomed. She is not ill-appearing or toxic-appearing.  HENT:     Head: Normocephalic.     Right Ear: Hearing, tympanic membrane, ear canal and external ear normal.     Left Ear: Hearing, tympanic membrane, ear canal and external ear normal.  Eyes:     General: Lids are normal.        Right eye: No discharge.        Left eye: No discharge.     Conjunctiva/sclera: Conjunctivae normal.     Pupils: Pupils are equal, round, and reactive to light.  Neck:     Thyroid : No thyromegaly.     Vascular: No carotid bruit or JVD.  Cardiovascular:     Rate and Rhythm: Normal rate and regular rhythm.     Pulses:          Dorsalis pedis  pulses are 2+ on the right side and 2+ on the left side.       Posterior tibial pulses are 2+ on the right side and 2+ on  the left side.     Heart sounds: Normal heart sounds. No murmur heard.    No gallop.  Pulmonary:     Effort: Pulmonary effort is normal.     Breath sounds: Normal breath sounds.  Abdominal:     General: Bowel sounds are normal.     Palpations: Abdomen is soft.  Musculoskeletal:     Cervical back: Normal range of motion and neck supple.     Right lower leg: No edema.     Left lower leg: No edema.     Right foot: Normal range of motion.     Left foot: Normal range of motion.  Feet:     Right foot:     Protective Sensation: 10 sites tested.  10 sites sensed.     Left foot:     Protective Sensation: 10 sites tested.  10 sites sensed.  Lymphadenopathy:     Cervical: No cervical adenopathy.  Skin:    General: Skin is warm and dry.     Findings: Wound present.      Neurological:     Mental Status: She is alert and oriented to person, place, and time.  Psychiatric:        Attention and Perception: Attention normal.        Mood and Affect: Mood normal.        Behavior: Behavior normal. Behavior is cooperative.        Thought Content: Thought content normal.        Judgment: Judgment normal.    Results for orders placed or performed in visit on 02/11/23  T4, free   Collection Time: 02/11/23  8:46 AM  Result Value Ref Range   Free T4 1.30 0.82 - 1.77 ng/dL  TSH   Collection Time: 02/11/23  8:46 AM  Result Value Ref Range   TSH 2.440 0.450 - 4.500 uIU/mL  Lipid Panel w/o Chol/HDL Ratio   Collection Time: 02/11/23  8:46 AM  Result Value Ref Range   Cholesterol, Total 172 100 - 199 mg/dL   Triglycerides 883 0 - 149 mg/dL   HDL 83 >60 mg/dL   VLDL Cholesterol Cal 20 5 - 40 mg/dL   LDL Chol Calc (NIH) 69 0 - 99 mg/dL      Assessment & Plan:   Problem List Items Addressed This Visit       Cardiovascular and Mediastinum   Essential hypertension    Chronic, stable.  BP at goal today in office.  Discussed orthostatic hypotension, suspect this is main issue with dizziness.  Recommend she get up slowly from sitting + wear compression hose on during day and off at night.  Recommend she monitor BP at least a few mornings a week at home and document.  DASH diet at home.  Continue current medication regimen and adjust as needed.  Labs today: up to date with nephrology.  Return in 6 months.       Aortic atherosclerosis (HCC)   Chronic, stable.  Noted on imaging 01/27/2019.  Recommend to continue healthy diet focus. Continue to take Crestor  daily for prevention.      Relevant Orders   Lipid Panel w/o Chol/HDL Ratio     Endocrine   Secondary hyperparathyroidism of renal origin (HCC)   Chronic, ongoing.  Followed by nephrology, continue current medication regimen as recommended by them.  Recent labs and notes reviewed.      Hypothyroidism   Chronic, ongoing.  Continue current medication regimen and adjust as  needed.  Thyroid  labs today.      Relevant Orders   T4, free   TSH     Genitourinary   Chronic kidney disease, stage 4, severely decreased GFR (HCC) - Primary   Chronic, ongoing, followed by nephrology.  Continue this collaboration and medication regimen as recommended by them.  Recent labs and notes reviewed.  LABS: up to date.        Other   Hyperlipidemia   Chronic, ongoing.  Is taking Crestor , continue this and adjust at needed.  Lipid panel on labs today.      Relevant Orders   Lipid Panel w/o Chol/HDL Ratio   Cellulitis of right ankle   Acute, after injury 2 1/2 to 3 weeks ago with tin can landing on ankle. Needs Tdap or Td, but not available in office today. Will plan on giving next week at visit once in stock.  Start Doxycycline  100 MG BID for 7 days + Mupirocin  to apply to wound 2-3 times daily.  Cleanse wound with wound cleanser.  To take abx with probiotic yogurt to help prevent GI side effects. Advised her to check  temp daily and draw circle around outside of redness to monitor and if any extension past line or fever she is to immediately alert PCP.        Follow up plan: Return in about 1 week (around 02/12/2024) for Cellulitis right ankle.

## 2024-02-05 NOTE — Assessment & Plan Note (Signed)
 Chronic, ongoing, followed by nephrology.  Continue this collaboration and medication regimen as recommended by them.  Recent labs and notes reviewed.  LABS: up to date.

## 2024-02-05 NOTE — Assessment & Plan Note (Signed)
 Chronic, ongoing.  Is taking Crestor , continue this and adjust at needed.  Lipid panel on labs today.

## 2024-02-06 NOTE — Progress Notes (Signed)
 Scheduled

## 2024-02-09 NOTE — Patient Instructions (Signed)
 Cellulitis, Adult    Cellulitis is a skin infection. The infected area is often warm, red, swollen, and sore. It occurs most often on the legs, feet, and toes, but can happen on any part of the body.  This condition can be life-threatening without treatment. It is very important to get treated right away.  What are the causes?  This condition is caused by bacteria. The bacteria enter through a break in the skin, such as:  A cut.  A burn.  A bug bite.  An animal bite.  An open sore.  A crack.  What increases the risk?  Having a weak body's defense system (immune system).  Being older than 88 years old.  Having a blood sugar problem (diabetes).  Having a long-term liver disease (cirrhosis) or kidney disease.  Being very overweight (obese).  Having a skin problem, such as:  An itchy rash.  A rash caused by a fungus.  A rash with blisters.  Slow movement of blood in the veins (venous stasis).  Fluid buildup below the skin (edema).  This condition is more likely to occur in people who:  Have open cuts, burns, bites, or scrapes on the skin.  Have been treated with high-energy rays (radiation).  Use IV drugs.  What are the signs or symptoms?  Skin that:  Looks red or purple, or slightly darker than your usual skin color.  Has streaks.  Has spots.  Is swollen.  Is sore or painful when you touch it.  Is warm.  A fever.  Chills.  Blisters.  Tiredness (fatigue).  How is this treated?  Medicines to treat infections or allergies.  Rest.  Placing cold or warm cloths on the skin.  Staying in the hospital, if the condition is very bad. You may need medicines through an IV.  Follow these instructions at home:  Medicines  Take over-the-counter and prescription medicines only as told by your doctor.  If you were prescribed antibiotics, take them as told by your doctor. Do not stop using them even if you start to feel better.  General instructions  Drink enough fluid to keep your pee (urine) pale yellow.  Do not touch or rub the  infected area.  Raise (elevate) the infected area above the level of your heart while you are sitting or lying down.  Return to your normal activities when your doctor says that it is safe.  Place cold or warm cloths on the area as told by your doctor.  Keep all follow-up visits. Your doctor will need to make sure that a more serious infection is not developing.  Contact a doctor if:  You have a fever.  You do not start to get better after 1-2 days of treatment.  Your bone or joint under the infected area starts to hurt after the skin has healed.  Your infection comes back in the same area or another area. Signs of this may include:  You have a swollen bump in the area.  Your red area gets larger, turns dark in color, or hurts more.  You have more fluid coming from the wound.  Pus or a bad smell develops in your infected area.  You have more pain.  You feel sick and have muscle aches and weakness.  You develop vomiting or watery poop that will not go away.  Get help right away if:  You see red streaks coming from the area.  You notice the skin turns purple or black and falls  off.  These symptoms may be an emergency. Get help right away. Call 911.  Do not wait to see if the symptoms will go away.  Do not drive yourself to the hospital.  This information is not intended to replace advice given to you by your health care provider. Make sure you discuss any questions you have with your health care provider.  Document Revised: 01/16/2022 Document Reviewed: 01/16/2022  Elsevier Patient Education  2024 [employer].

## 2024-02-12 ENCOUNTER — Encounter: Payer: Self-pay | Admitting: Nurse Practitioner

## 2024-02-12 ENCOUNTER — Ambulatory Visit: Admitting: Nurse Practitioner

## 2024-02-12 VITALS — BP 134/72 | HR 69 | Temp 98.1°F | Ht 69.0 in | Wt 164.0 lb

## 2024-02-12 DIAGNOSIS — L03115 Cellulitis of right lower limb: Secondary | ICD-10-CM

## 2024-02-12 DIAGNOSIS — Z23 Encounter for immunization: Secondary | ICD-10-CM

## 2024-02-12 DIAGNOSIS — T148XXA Other injury of unspecified body region, initial encounter: Secondary | ICD-10-CM | POA: Diagnosis not present

## 2024-02-12 NOTE — Assessment & Plan Note (Signed)
 Acute, after injury 4 weeks ago with tin can landing on ankle. Completed Doxycycline  and still using Mupirocin  daily, recommend to continue this until fully healed.  Cleanse wound with wound cleanser. Advised her to continue to check temp daily and draw circle around outside of redness to monitor and if any extension past line or fever she is to immediately alert PCP. Overall improving. Tdap today in office.

## 2024-02-12 NOTE — Progress Notes (Signed)
 BP 134/72 (BP Location: Left Arm, Patient Position: Sitting, Cuff Size: Normal)   Pulse 69   Temp 98.1 F (36.7 C) (Oral)   Ht 5' 9 (1.753 m)   Wt 164 lb (74.4 kg)   SpO2 97%   BMI 24.22 kg/m    Subjective:    Patient ID: Karla Little, female    DOB: 1934/04/27, 89 y.o.   MRN: [id number]  HPI: Karla Little is a 89 y.o. female  Chief Complaint  Patient presents with   Cellulitis    Swelling, redness around site, no pain, currently taking doxycycline  that's making her feel bad    SKIN INFECTION Follow-up for cellulitis to right ankle. Treated with Doxycycline  and Mupirocin . Reports did not tolerate abx well, but completed it.  Made her feel nauseous and would prefer not to take anymore. About 4 weeks ago was sitting down to count her change, the change tin can was heavy. It slipped out of her hand then hit her medial right ankle. Caused a wound to area, skin pulled back. She reports wound is healing well and improving. Duration: weeks Location: right medial ankle History of trauma in area: yes Pain: none Quality: no Redness: yes improving Swelling: yes improving Oozing: no Pus: no Fevers: no Nausea/vomiting: no Status: improving Treatments attempted:antibiotics  Tetanus: UTD given today  Relevant past medical, surgical, family and social history reviewed and updated as indicated. Interim medical history since our last visit reviewed. Allergies and medications reviewed and updated.  Review of Systems  Constitutional:  Negative for activity change, appetite change, diaphoresis, fatigue and fever.  Respiratory:  Negative for cough, chest tightness and shortness of breath.   Cardiovascular:  Negative for chest pain, palpitations and leg swelling.  Gastrointestinal: Negative.   Endocrine: Negative for cold intolerance, heat intolerance and polyuria.  Skin:  Positive for wound.  Neurological: Negative.   Psychiatric/Behavioral: Negative.      Per HPI unless  specifically indicated above     Objective:    BP 134/72 (BP Location: Left Arm, Patient Position: Sitting, Cuff Size: Normal)   Pulse 69   Temp 98.1 F (36.7 C) (Oral)   Ht 5' 9 (1.753 m)   Wt 164 lb (74.4 kg)   SpO2 97%   BMI 24.22 kg/m   Wt Readings from Last 3 Encounters:  02/12/24 164 lb (74.4 kg)  02/05/24 167 lb (75.8 kg)  08/05/23 167 lb 3.2 oz (75.8 kg)    Physical Exam Vitals and nursing note reviewed.  Constitutional:      General: She is awake. She is not in acute distress.    Appearance: She is well-developed and well-groomed. She is not ill-appearing or toxic-appearing.  HENT:     Head: Normocephalic.     Right Ear: Hearing, tympanic membrane, ear canal and external ear normal.     Left Ear: Hearing, tympanic membrane, ear canal and external ear normal.  Eyes:     General: Lids are normal.        Right eye: No discharge.        Left eye: No discharge.     Conjunctiva/sclera: Conjunctivae normal.     Pupils: Pupils are equal, round, and reactive to light.  Neck:     Thyroid : No thyromegaly.     Vascular: No carotid bruit or JVD.  Cardiovascular:     Rate and Rhythm: Normal rate and regular rhythm.     Pulses:  Dorsalis pedis pulses are 2+ on the right side and 2+ on the left side.       Posterior tibial pulses are 2+ on the right side and 2+ on the left side.     Heart sounds: Normal heart sounds. No murmur heard.    No gallop.  Pulmonary:     Effort: Pulmonary effort is normal.     Breath sounds: Normal breath sounds.  Abdominal:     General: Bowel sounds are normal.     Palpations: Abdomen is soft.  Musculoskeletal:     Cervical back: Normal range of motion and neck supple.     Right lower leg: No edema.     Left lower leg: No edema.     Right foot: Normal range of motion.     Left foot: Normal range of motion.  Feet:     Right foot:     Protective Sensation: 10 sites tested.  10 sites sensed.     Left foot:     Protective Sensation:  10 sites tested.  10 sites sensed.  Lymphadenopathy:     Cervical: No cervical adenopathy.  Skin:    General: Skin is warm and dry.     Findings: Wound present.      Neurological:     Mental Status: She is alert and oriented to person, place, and time.  Psychiatric:        Attention and Perception: Attention normal.        Mood and Affect: Mood normal.        Behavior: Behavior normal. Behavior is cooperative.        Thought Content: Thought content normal.        Judgment: Judgment normal.     Results for orders placed or performed in visit on 02/11/23  T4, free   Collection Time: 02/11/23  8:46 AM  Result Value Ref Range   Free T4 1.30 0.82 - 1.77 ng/dL  TSH   Collection Time: 02/11/23  8:46 AM  Result Value Ref Range   TSH 2.440 0.450 - 4.500 uIU/mL  Lipid Panel w/o Chol/HDL Ratio   Collection Time: 02/11/23  8:46 AM  Result Value Ref Range   Cholesterol, Total 172 100 - 199 mg/dL   Triglycerides 883 0 - 149 mg/dL   HDL 83 >60 mg/dL   VLDL Cholesterol Cal 20 5 - 40 mg/dL   LDL Chol Calc (NIH) 69 0 - 99 mg/dL      Assessment & Plan:   Problem List Items Addressed This Visit       Other   Cellulitis of right ankle - Primary   Acute, after injury 4 weeks ago with tin can landing on ankle. Completed Doxycycline  and still using Mupirocin  daily, recommend to continue this until fully healed.  Cleanse wound with wound cleanser. Advised her to continue to check temp daily and draw circle around outside of redness to monitor and if any extension past line or fever she is to immediately alert PCP. Overall improving. Tdap today in office.      Other Visit Diagnoses       Foreign body in wound of skin       Tdap in office today   Relevant Orders   Tdap vaccine greater than or equal to 7yo IM (Completed)     Flu vaccine need       Flu vaccine in office today, educated patient.        Follow up plan:  Return in about 1 year (around 02/23/2025) for Annual  Physical.

## 2024-02-13 ENCOUNTER — Ambulatory Visit (INDEPENDENT_AMBULATORY_CARE_PROVIDER_SITE_OTHER): Admitting: Emergency Medicine

## 2024-02-13 VITALS — Ht 69.0 in | Wt 160.0 lb

## 2024-02-13 DIAGNOSIS — Z0000 Encounter for general adult medical examination without abnormal findings: Secondary | ICD-10-CM

## 2024-02-13 NOTE — Patient Instructions (Signed)
 Karla Little,  Thank you for taking the time for your Medicare Wellness Visit. I appreciate your continued commitment to your health goals. Please review the care plan we discussed, and feel free to reach out if I can assist you further.  Medicare recommends these wellness visits once per year to help you and your care team stay ahead of potential health issues. These visits are designed to focus on prevention, allowing your provider to concentrate on managing your acute and chronic conditions during your regular appointments.  Please note that Annual Wellness Visits do not include a physical exam. Some assessments may be limited, especially if the visit was conducted virtually. If needed, we may recommend a separate in-person follow-up with your provider.  Ongoing Care Seeing your primary care provider every 3 to 6 months helps us  monitor your health and provide consistent, personalized care.   Referrals If a referral was made during today's visit and you haven't received any updates within two weeks, please contact the referred provider directly to check on the status.  Recommended Screenings:  Health Maintenance  Topic Date Due   COVID-19 Vaccine (4 - 2025-26 season) 02/03/2024   Zoster (Shingles) Vaccine (1 of 2) 05/03/2024*   Medicare Annual Wellness Visit  02/12/2025   Flu Shot  Completed   DEXA scan (bone density measurement)  Completed   HPV Vaccine  Aged Out   Meningitis B Vaccine  Aged Out   DTaP/Tdap/Td vaccine  Discontinued   Pneumococcal Vaccine for age over 65  Discontinued  *Topic was postponed. The date shown is not the original due date.       02/13/2024    1:22 PM  Advanced Directives  Does Patient Have a Medical Advance Directive? Yes  Type of Estate agent of [place];Living will  Does patient want to make changes to medical advance directive? No - Patient declined  Copy of Healthcare Power of Attorney in Chart? No - copy requested    Advance Care Planning is important because it: Ensures you receive medical care that aligns with your values, goals, and preferences. Provides guidance to your family and loved ones, reducing the emotional burden of decision-making during critical moments.  Vision: Annual vision screenings are recommended for early detection of glaucoma, cataracts, and diabetic retinopathy. These exams can also reveal signs of chronic conditions such as diabetes and high blood pressure.  Dental: Annual dental screenings help detect early signs of oral cancer, gum disease, and other conditions linked to overall health, including heart disease and diabetes.  Please see the attached documents for additional preventive care recommendations.   Fall Prevention in the Home, Adult Falls can cause injuries and affect people of all ages. There are many simple things that you can do to make your home safe and to help prevent falls. If you need it, ask for help making these changes. What actions can I take to prevent falls? General information Use good lighting in all rooms. Make sure to: Replace any light bulbs that burn out. Turn on lights if it is dark and use night-lights. Keep items that you use often in easy-to-reach places. Lower the shelves around your home if needed. Move furniture so that there are clear paths around it. Do not keep throw rugs or other things on the floor that can make you trip. If any of your floors are uneven, fix them. Add color or contrast paint or tape to clearly mark and help you see: Grab bars or handrails. First and  last steps of staircases. Where the edge of each step is. If you use a ladder or stepladder: Make sure that it is fully opened. Do not climb a closed ladder. Make sure the sides of the ladder are locked in place. Have someone hold the ladder while you use it. Know where your pets are as you move through your home. What can I do in the bathroom?     Keep the  floor dry. Clean up any water that is on the floor right away. Remove soap buildup in the bathtub or shower. Buildup makes bathtubs and showers slippery. Use non-skid mats or decals on the floor of the bathtub or shower. Attach bath mats securely with double-sided, non-slip rug tape. If you need to sit down while you are in the shower, use a non-slip stool. Install grab bars by the toilet and in the bathtub and shower. Do not use towel bars as grab bars. What can I do in the bedroom? Make sure that you have a light by your bed that is easy to reach. Do not use any sheets or blankets on your bed that hang to the floor. Have a firm bench or chair with side arms that you can use for support when you get dressed. What can I do in the kitchen? Clean up any spills right away. If you need to reach something above you, use a sturdy step stool that has a grab bar. Keep electrical cables out of the way. Do not use floor polish or wax that makes floors slippery. What can I do with my stairs? Do not leave anything on the stairs. Make sure that you have a light switch at the top and the bottom of the stairs. Have them installed if you do not have them. Make sure that there are handrails on both sides of the stairs. Fix handrails that are broken or loose. Make sure that handrails are as long as the staircases. Install non-slip stair treads on all stairs in your home if they do not have carpet. Avoid having throw rugs at the top or bottom of stairs, or secure the rugs with carpet tape to prevent them from moving. Choose a carpet design that does not hide the edge of steps on the stairs. Make sure that carpet is firmly attached to the stairs. Fix any carpet that is loose or worn. What can I do on the outside of my home? Use bright outdoor lighting. Repair the edges of walkways and driveways and fix any cracks. Clear paths of anything that can make you trip, such as tools or rocks. Add color or contrast  paint or tape to clearly mark and help you see high doorway thresholds. Trim any bushes or trees on the main path into your home. Check that handrails are securely fastened and in good repair. Both sides of all steps should have handrails. Install guardrails along the edges of any raised decks or porches. Have leaves, snow, and ice cleared regularly. Use sand, salt, or ice melt on walkways during winter months if you live where there is ice and snow. In the garage, clean up any spills right away, including grease or oil spills. What other actions can I take? Review your medicines with your health care provider. Some medicines can make you confused or feel dizzy. This can increase your chance of falling. Wear closed-toe shoes that fit well and support your feet. Wear shoes that have rubber soles and low heels. Use a cane, walker, scooter,  or crutches that help you move around if needed. Talk with your provider about other ways that you can decrease your risk of falls. This may include seeing a physical therapist to learn to do exercises to improve movement and strength. Where to find more information Centers for Disease Control and Prevention, STEADI: TonerPromos.no [employer] on Aging: BaseRingTones.pl National Institute on Aging: BaseRingTones.pl Contact a health care provider if: You are afraid of falling at home. You feel weak, drowsy, or dizzy at home. You fall at home. Get help right away if you: Lose consciousness or have trouble moving after a fall. Have a fall that causes a head injury. These symptoms may be an emergency. Get help right away. Call 911. Do not wait to see if the symptoms will go away. Do not drive yourself to the hospital. This information is not intended to replace advice given to you by your health care provider. Make sure you discuss any questions you have with your health care provider. Document Revised: 01/22/2022 Document Reviewed: 01/22/2022 Elsevier Patient Education   2024 [employer].

## 2024-02-13 NOTE — Progress Notes (Signed)
 Subjective:   Karla Little is a 89 y.o. who presents for a Medicare Wellness preventive visit.  As a reminder, Annual Wellness Visits don't include a physical exam, and some assessments may be limited, especially if this visit is performed virtually. We may recommend an in-person follow-up visit with your provider if needed.  Visit Complete: Virtual I connected with  Karla Little on 02/13/24 by a audio enabled telemedicine application and verified that I am speaking with the correct person using two identifiers.  Patient Location: Home  Provider Location: Home Office  I discussed the limitations of evaluation and management by telemedicine. The patient expressed understanding and agreed to proceed.  Vital Signs: Because this visit was a virtual/telehealth visit, some criteria may be missing or patient reported. Any vitals not documented were not able to be obtained and vitals that have been documented are patient reported.  VideoDeclined- This patient declined Librarian, academic. Therefore the visit was completed with audio only.  Persons Participating in Visit: Patient.  AWV Questionnaire: No: Patient Medicare AWV questionnaire was not completed prior to this visit.  Cardiac Risk Factors include: advanced age (>56men, >54 women);dyslipidemia;hypertension     Objective:    Today's Vitals   02/13/24 1311  Weight: 160 lb (72.6 kg)  Height: 5' 9 (1.753 m)   Body mass index is 23.63 kg/m.     02/13/2024    1:22 PM 07/07/2022    2:18 PM 06/26/2022   10:34 AM 09/20/2021    8:53 AM 07/16/2019    2:45 PM 11/07/2018   12:59 PM 09/12/2018    3:00 AM  Advanced Directives  Does Patient Have a Medical Advance Directive? Yes Yes No No No Yes No  Type of Estate agent of [place];Living will Healthcare Power of [place];Living will       Does patient want to make changes to medical advance directive? No - Patient declined         Copy of Healthcare Power of Attorney in Chart? No - copy requested   No - copy requested     Would patient like information on creating a medical advance directive?   No - Patient declined No - Patient declined   No - Patient declined      Data saved with a previous flowsheet row definition    Current Medications (verified) Outpatient Encounter Medications as of 02/13/2024  Medication Sig   amLODipine  (NORVASC ) 10 MG tablet Take 1 tablet (10 mg total) by mouth daily.   calcitRIOL (ROCALTROL) 0.25 MCG capsule Take 0.25 mcg by mouth daily.    famotidine  (PEPCID ) 20 MG tablet TAKE 1 TABLET BY MOUTH EVERYDAY AT BEDTIME   hydrALAZINE  (APRESOLINE ) 25 MG tablet Take 1 tablet (25 mg total) by mouth 3 (three) times daily.   levothyroxine  (SYNTHROID ) 50 MCG tablet Take 1 tablet (50 mcg total) by mouth daily.   lisinopril  (ZESTRIL ) 10 MG tablet Take 1 tablet (10 mg total) by mouth daily.   mupirocin  ointment (BACTROBAN ) 2 % Apply 1 Application topically 2 (two) times daily.   rosuvastatin  (CRESTOR ) 10 MG tablet Take 1 tablet (10 mg total) by mouth daily.   sodium bicarbonate  650 MG tablet Take 650 mg by mouth 2 (two) times daily.   vitamin B-12 (CYANOCOBALAMIN ) 250 MCG tablet Take 500 mcg by mouth daily.    Vitamin D3 (VITAMIN D ) 25 MCG tablet Take 1,000 Units by mouth daily.   No facility-administered encounter medications on file as of  02/13/2024.    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   Osteoporosis    Renal insufficiency    Vertigo    Past Surgical History:  Procedure Laterality Date   BREAST BIOPSY     CATARACT EXTRACTION W/PHACO Left 09/20/2021   Procedure: CATARACT EXTRACTION PHACO AND INTRAOCULAR LENS PLACEMENT (IOC) LEFT;  Surgeon: Mittie Gaskin, MD;  Location:  [facility];  Service: Ophthalmology;  Laterality: Left;  21.54 01:50.0   COLONOSCOPY W/ POLYPECTOMY     COLONOSCOPY WITH PROPOFOL  N/A 06/02/2015   Procedure: COLONOSCOPY WITH PROPOFOL ;  Surgeon: Lamar ONEIDA Holmes, MD;  Location: [facility];  Service: Endoscopy;  Laterality: N/A;   Family History  Problem Relation Age of Onset   Hypertension Mother    Heart disease Father    Breast cancer Sister    Social History   Socioeconomic History   Marital status: Widowed    Spouse name: Not on file   Number of children: 3   Years of education: Not on file   Highest education level: Not on file  Occupational History   Not on file  Tobacco Use   Smoking status: Never    Passive exposure: Never   Smokeless tobacco: Never  Vaping Use   Vaping status: Never Used  Substance and Sexual Activity   Alcohol use: No   Drug use: No   Sexual activity: Not on file  Other Topics Concern   Not on file  Social History Narrative   Not on file   Social Drivers of Health   Financial Resource Strain: Low Risk  (02/13/2024)   Overall Financial Resource Strain (CARDIA)    Difficulty of Paying Living Expenses: Not hard at all  Food Insecurity: No Food Insecurity (02/13/2024)   Hunger Vital Sign    Worried About Running Out of Food in the Last Year: Never true    Ran Out of Food in the Last Year: Never true  Transportation Needs: No Transportation Needs (02/13/2024)   PRAPARE - Administrator, Civil Service (Medical): No    Lack of Transportation (Non-Medical): No  Physical Activity: Inactive (02/13/2024)   Exercise Vital Sign    Days of Exercise per Week: 0 days    Minutes of Exercise per Session: 0 min  Stress: No Stress Concern Present (02/13/2024)   [employer] of Occupational Health - Occupational Stress Questionnaire    Feeling of Stress: Not at all  Social Connections: Socially Isolated (02/13/2024)   Social Connection and Isolation Panel    Frequency of  Communication with Friends and Family: More than three times a week    Frequency of Social Gatherings with Friends and Family: More than three times a week    Attends Religious Services: Never    Database administrator or Organizations: No    Attends Banker Meetings: Never    Marital Status: Widowed    Tobacco Counseling Counseling given: Not Answered    Clinical Intake:  Pre-visit preparation completed: Yes  Pain : No/denies pain     BMI - recorded: 23.63 Nutritional Status: BMI of 19-24  Normal Nutritional Risks: None Diabetes: No  No results found for: HGBA1C   How often do you need to have someone help  you when you read instructions, pamphlets, or other written materials from your doctor or pharmacy?: 1 - Never  Interpreter Needed?: No  Information entered by :: Vina Ned, CMA   Activities of Daily Living     02/13/2024    1:13 PM  In your present state of health, do you have any difficulty performing the following activities:  Hearing? 1  Comment has 80% hearing, no hearing aids  Vision? 0  Difficulty concentrating or making decisions? 0  Walking or climbing stairs? 0  Dressing or bathing? 0  Doing errands, shopping? 0  Preparing Food and eating ? N  Using the Toilet? N  In the past six months, have you accidently leaked urine? Y  Comment wears pad  Do you have problems with loss of bowel control? N  Managing your Medications? N  Managing your Finances? N  Housekeeping or managing your Housekeeping? N    Patient Care Team: Cannady, Jolene T, NP as PCP - General (Nurse Practitioner) Marcelino Havens, MD as Referring Physician (Internal Medicine) Mevelyn JONETTA Bathe, OD (Optometry)  I have updated your Care Teams any recent Medical Services you may have received from other providers in the past year.     Assessment:   This is a routine wellness examination for [place].  Hearing/Vision screen Hearing Screening - Comments:: has 80%  hearing, no hearing aids Vision Screening - Comments:: Gets routine eye exams, Dr. Bathe Mevelyn, [place] [place]   Goals Addressed             This Visit's Progress    Patient Stated       Maintain current healthy and activity level       Depression Screen     02/13/2024    1:19 PM 08/05/2023   11:03 AM 02/11/2023    8:21 AM 11/27/2022    1:18 PM 08/09/2022    8:14 AM 06/26/2022   10:35 AM 02/08/2022    3:15 PM  PHQ 2/9 Scores  PHQ - 2 Score 0 0 0 0 0 0 0  PHQ- 9 Score 0 0 0 0 0  0    Fall Risk     02/13/2024    1:25 PM 08/05/2023   11:03 AM 02/11/2023    8:20 AM 11/27/2022    1:18 PM 08/09/2022    8:14 AM  Fall Risk   Falls in the past year? 0 0 0 0 0  Number falls in past yr: 0 0 0 0 0  Injury with Fall? 0 0 0 0 0  Risk for fall due to : History of fall(s);Impaired balance/gait;Orthopedic patient No Fall Risks No Fall Risks No Fall Risks No Fall Risks  Risk for fall due to: Comment uses cane prn outside the home      Follow up Falls evaluation completed;Education provided Falls evaluation completed Falls evaluation completed Falls prevention discussed;Education provided;Falls evaluation completed Falls evaluation completed    MEDICARE RISK AT HOME:  Medicare Risk at Home Any stairs in or around the home?: Yes If so, are there any without handrails?: No Home free of loose throw rugs in walkways, pet beds, electrical cords, etc?: Yes Adequate lighting in your home to reduce risk of falls?: Yes Life alert?: No Use of a cane, walker or w/c?: Yes (uses cane prn outside the home) Grab bars in the bathroom?: Yes Shower chair or bench in shower?: Yes Elevated toilet seat or a handicapped toilet?: No  TIMED UP AND GO:  Was the test performed?  No  Cognitive Function: 6CIT completed        02/13/2024    1:27 PM 06/26/2022   10:36 AM 02/13/2021    6:31 PM 02/13/2021    5:42 PM  6CIT Screen  What Year? 0 points 0 points 0 points 0 points  What month? 0 points 0 points 0 points 0  points  What time? 0 points 0 points 0 points 0 points  Count back from 20 0 points  0 points 0 points  Months in reverse 0 points  0 points 0 points  Repeat phrase 2 points  0 points 0 points  Total Score 2 points  0 points 0 points    Immunizations Immunization History  Administered Date(s) Administered   Fluad Quad(high Dose 65+) 02/13/2019, 03/16/2020, 02/28/2021, 02/21/2022   Fluad Trivalent(High Dose 65+) 02/11/2023   INFLUENZA, HIGH DOSE SEASONAL PF 02/28/2016, 03/07/2017, 03/17/2018, 02/12/2024   Influenza,inj,Quad PF,6+ Mos 03/16/2015   Influenza-Unspecified 02/22/2014, 02/23/2022   Moderna Sars-Covid-2 Vaccination 07/17/2019, 08/14/2019, 05/05/2020   Pneumococcal Conjugate-13 12/30/2013   Pneumococcal-Unspecified 02/17/2004, 05/03/2009   Td 04/14/2008   Tdap 02/12/2024   Zoster, Live 04/22/2006    Screening Tests Health Maintenance  Topic Date Due   COVID-19 Vaccine (4 - 2025-26 season) 02/03/2024   Zoster Vaccines- Shingrix (1 of 2) 05/03/2024 (Originally 09/09/1952)   Medicare Annual Wellness (AWV)  02/12/2025   Influenza Vaccine  Completed   DEXA SCAN  Completed   HPV VACCINES  Aged Out   Meningococcal B Vaccine  Aged Out   DTaP/Tdap/Td  Discontinued   Pneumococcal Vaccine: 50+ Years  Discontinued    Health Maintenance Items Addressed: See Nurse Notes at the end of this note  Additional Screening:  Vision Screening: Recommended annual ophthalmology exams for early detection of glaucoma and other disorders of the eye. Is the patient up to date with their annual eye exam?  Yes  Who is the provider or what is the name of the office in which the patient attends annual eye exams? Dr. Robynn Antigua, [place]  Dental Screening: Recommended annual dental exams for proper oral hygiene  Community Resource Referral / Chronic Care Management: CRR required this visit?  No   CCM required this visit?  No   Plan:    I have personally reviewed and noted the  following in the patient's chart:   Medical and social history Use of alcohol, tobacco or illicit drugs  Current medications and supplements including opioid prescriptions. Patient is not currently taking opioid prescriptions. Functional ability and status Nutritional status Physical activity Advanced directives List of other physicians Hospitalizations, surgeries, and ER visits in previous 12 months Vitals Screenings to include cognitive, depression, and falls Referrals and appointments  In addition, I have reviewed and discussed with patient certain preventive protocols, quality metrics, and best practice recommendations. A written personalized care plan for preventive services as well as general preventive health recommendations were provided to patient.   Vina Ned, CMA   02/13/2024   After Visit Summary: (MyChart) Due to this being a telephonic visit, the after visit summary with patients personalized plan was offered to patient via MyChart   Notes:  6 CIT Score - 2 Declined Shingles and covid vaccines Screening colonoscopy, MMG and DEXA no longer recommended due to age Patient upset during today's visit. Did not want to do AWV. Explanation was given as to the purpose of today's visit and that it is not mandatory, but a benefit of Medicare. Patient did complete today's  visit, but does not want to do anymore AWVs.

## 2024-03-04 ENCOUNTER — Inpatient Hospital Stay
Admission: EM | Admit: 2024-03-04 | Discharge: 2024-03-06 | DRG: 690 | Disposition: A | Discharge status: Home / Self Care

## 2024-03-04 ENCOUNTER — Telehealth: Payer: Self-pay | Admitting: Emergency Medicine

## 2024-03-04 ENCOUNTER — Emergency Department
Admission: EM | Admit: 2024-03-04 | Discharge: 2024-03-04 | Disposition: A | Discharge status: Home / Self Care | Source: Home / Self Care | Attending: Emergency Medicine | Admitting: Emergency Medicine

## 2024-03-04 ENCOUNTER — Encounter: Payer: Self-pay | Admitting: Intensive Care

## 2024-03-04 ENCOUNTER — Emergency Department

## 2024-03-04 ENCOUNTER — Other Ambulatory Visit: Payer: Self-pay

## 2024-03-04 DIAGNOSIS — Z1152 Encounter for screening for COVID-19: Secondary | ICD-10-CM

## 2024-03-04 DIAGNOSIS — R110 Nausea: Secondary | ICD-10-CM | POA: Diagnosis not present

## 2024-03-04 DIAGNOSIS — E039 Hypothyroidism, unspecified: Secondary | ICD-10-CM | POA: Diagnosis present

## 2024-03-04 DIAGNOSIS — N184 Chronic kidney disease, stage 4 (severe): Secondary | ICD-10-CM | POA: Diagnosis present

## 2024-03-04 DIAGNOSIS — N3090 Cystitis, unspecified without hematuria: Secondary | ICD-10-CM | POA: Insufficient documentation

## 2024-03-04 DIAGNOSIS — I129 Hypertensive chronic kidney disease with stage 1 through stage 4 chronic kidney disease, or unspecified chronic kidney disease: Secondary | ICD-10-CM | POA: Insufficient documentation

## 2024-03-04 DIAGNOSIS — I700 Atherosclerosis of aorta: Secondary | ICD-10-CM | POA: Diagnosis not present

## 2024-03-04 DIAGNOSIS — Z79890 Hormone replacement therapy (postmenopausal): Secondary | ICD-10-CM

## 2024-03-04 DIAGNOSIS — Z803 Family history of malignant neoplasm of breast: Secondary | ICD-10-CM

## 2024-03-04 DIAGNOSIS — R7881 Bacteremia: Principal | ICD-10-CM | POA: Diagnosis present

## 2024-03-04 DIAGNOSIS — M810 Age-related osteoporosis without current pathological fracture: Secondary | ICD-10-CM | POA: Diagnosis present

## 2024-03-04 DIAGNOSIS — B9620 Unspecified Escherichia coli [E. coli] as the cause of diseases classified elsewhere: Secondary | ICD-10-CM | POA: Diagnosis present

## 2024-03-04 DIAGNOSIS — R509 Fever, unspecified: Secondary | ICD-10-CM | POA: Diagnosis not present

## 2024-03-04 DIAGNOSIS — E785 Hyperlipidemia, unspecified: Secondary | ICD-10-CM | POA: Diagnosis present

## 2024-03-04 DIAGNOSIS — K449 Diaphragmatic hernia without obstruction or gangrene: Secondary | ICD-10-CM | POA: Diagnosis not present

## 2024-03-04 DIAGNOSIS — N179 Acute kidney failure, unspecified: Secondary | ICD-10-CM | POA: Diagnosis present

## 2024-03-04 DIAGNOSIS — Z8249 Family history of ischemic heart disease and other diseases of the circulatory system: Secondary | ICD-10-CM

## 2024-03-04 LAB — URINALYSIS, W/ REFLEX TO CULTURE (INFECTION SUSPECTED)
Bilirubin Urine: NEGATIVE
Glucose, UA: NEGATIVE mg/dL
Hgb urine dipstick: NEGATIVE
Ketones, ur: NEGATIVE mg/dL
Nitrite: POSITIVE — AB
Protein, ur: 30 mg/dL — AB
Specific Gravity, Urine: 1.008 (ref 1.005–1.030)
WBC, UA: >50 WBC/hpf (ref 0–5)
pH: 7 (ref 5.0–8.0)

## 2024-03-04 LAB — COMPREHENSIVE METABOLIC PANEL WITH GFR
ALT: 9 U/L (ref 0–44)
AST: 22 U/L (ref 15–41)
Albumin: 3.9 g/dL (ref 3.5–5.0)
Alkaline Phosphatase: 53 U/L (ref 38–126)
Anion gap: 11 (ref 5–15)
BUN: 31 mg/dL — ABNORMAL HIGH (ref 8–23)
CO2: 22 mmol/L (ref 22–32)
Calcium: 9.6 mg/dL (ref 8.9–10.3)
Chloride: 105 mmol/L (ref 98–111)
Creatinine, Ser: 2.14 mg/dL — ABNORMAL HIGH (ref 0.44–1.00)
GFR, Estimated: 22 mL/min — ABNORMAL LOW (ref >60)
Glucose, Bld: 141 mg/dL — ABNORMAL HIGH (ref 70–99)
Potassium: 3.8 mmol/L (ref 3.5–5.1)
Sodium: 138 mmol/L (ref 135–145)
Total Bilirubin: 1 mg/dL (ref 0.0–1.2)
Total Protein: 6.8 g/dL (ref 6.5–8.1)

## 2024-03-04 LAB — BLOOD CULTURE ID PANEL (REFLEXED) - BCID2

## 2024-03-04 LAB — CBC WITH DIFFERENTIAL/PLATELET
Abs Immature Granulocytes: 0.01 K/uL (ref 0.00–0.07)
Abs Immature Granulocytes: 0.04 K/uL (ref 0.00–0.07)
Basophils Absolute: 0 K/uL (ref 0.0–0.1)
Basophils Absolute: 0 K/uL (ref 0.0–0.1)
Basophils Relative: 0 %
Basophils Relative: 0 %
Eosinophils Absolute: 0 K/uL (ref 0.0–0.5)
Eosinophils Absolute: 0 K/uL (ref 0.0–0.5)
Eosinophils Relative: 1 %
Eosinophils Relative: 0 %
HCT: 38.6 % (ref 36.0–46.0)
HCT: 34.8 % — ABNORMAL LOW (ref 36.0–46.0)
Hemoglobin: 12.9 g/dL (ref 12.0–15.0)
Hemoglobin: 11.7 g/dL — ABNORMAL LOW (ref 12.0–15.0)
Immature Granulocytes: 0 %
Immature Granulocytes: 0 %
Lymphocytes Relative: 5 %
Lymphocytes Relative: 6 %
Lymphs Abs: 0.3 K/uL — ABNORMAL LOW (ref 0.7–4.0)
Lymphs Abs: 0.8 K/uL (ref 0.7–4.0)
MCH: 31.6 pg (ref 26.0–34.0)
MCH: 31.8 pg (ref 26.0–34.0)
MCHC: 33.4 g/dL (ref 30.0–36.0)
MCHC: 33.6 g/dL (ref 30.0–36.0)
MCV: 94.6 fL (ref 80.0–100.0)
MCV: 94.6 fL (ref 80.0–100.0)
Monocytes Absolute: 0 K/uL — ABNORMAL LOW (ref 0.1–1.0)
Monocytes Absolute: 0.8 K/uL (ref 0.1–1.0)
Monocytes Relative: 1 %
Monocytes Relative: 6 %
Neutro Abs: 6.1 K/uL (ref 1.7–7.7)
Neutro Abs: 10.9 K/uL — ABNORMAL HIGH (ref 1.7–7.7)
Neutrophils Relative %: 93 %
Neutrophils Relative %: 88 %
Platelets: 185 K/uL (ref 150–400)
Platelets: 167 K/uL (ref 150–400)
RBC: 4.08 MIL/uL (ref 3.87–5.11)
RBC: 3.68 MIL/uL — ABNORMAL LOW (ref 3.87–5.11)
RDW: 12.6 % (ref 11.5–15.5)
RDW: 13 % (ref 11.5–15.5)
WBC: 6.5 K/uL (ref 4.0–10.5)
WBC: 12.5 K/uL — ABNORMAL HIGH (ref 4.0–10.5)
nRBC: 0 % (ref 0.0–0.2)
nRBC: 0 % (ref 0.0–0.2)

## 2024-03-04 LAB — BASIC METABOLIC PANEL WITH GFR
Anion gap: 5 (ref 5–15)
BUN: 36 mg/dL — ABNORMAL HIGH (ref 8–23)
CO2: 27 mmol/L (ref 22–32)
Calcium: 9.1 mg/dL (ref 8.9–10.3)
Chloride: 105 mmol/L (ref 98–111)
Creatinine, Ser: 2.63 mg/dL — ABNORMAL HIGH (ref 0.44–1.00)
GFR, Estimated: 17 mL/min — ABNORMAL LOW (ref >60)
Glucose, Bld: 123 mg/dL — ABNORMAL HIGH (ref 70–99)
Potassium: 4.5 mmol/L (ref 3.5–5.1)
Sodium: 137 mmol/L (ref 135–145)

## 2024-03-04 LAB — RESP PANEL BY RT-PCR (RSV, FLU A&B, COVID)  RVPGX2
Influenza A by PCR: NEGATIVE
Influenza B by PCR: NEGATIVE
Resp Syncytial Virus by PCR: NEGATIVE
SARS Coronavirus 2 by RT PCR: NEGATIVE

## 2024-03-04 LAB — LACTIC ACID, PLASMA
Lactic Acid, Venous: 1.5 mmol/L (ref 0.5–1.9)
Lactic Acid, Venous: 0.8 mmol/L (ref 0.5–1.9)

## 2024-03-04 MED ORDER — SODIUM CHLORIDE 0.9% FLUSH
3.0000 mL | Freq: Two times a day (BID) | INTRAVENOUS | Status: DC
  Administered 2024-03-04 – 2024-03-06 (×3): 3 mL via INTRAVENOUS

## 2024-03-04 MED ORDER — ENOXAPARIN SODIUM 30 MG/0.3ML IJ SOSY
30.0000 mg | PREFILLED_SYRINGE | INTRAMUSCULAR | Status: DC
  Administered 2024-03-04 – 2024-03-05 (×2): 30 mg via SUBCUTANEOUS
  Filled 2024-03-04 (×2): qty 0.3

## 2024-03-04 MED ORDER — SODIUM CHLORIDE 0.9 % IV SOLN
1.0000 g | Freq: Once | INTRAVENOUS | Status: AC
  Administered 2024-03-04: 1 g via INTRAVENOUS
  Filled 2024-03-04: qty 10

## 2024-03-04 MED ORDER — SODIUM CHLORIDE 0.9 % IV SOLN
1.0000 g | INTRAVENOUS | Status: AC
  Administered 2024-03-04: 1 g via INTRAVENOUS
  Filled 2024-03-04: qty 10

## 2024-03-04 MED ORDER — LEVOTHYROXINE SODIUM 50 MCG PO TABS
50.0000 ug | ORAL_TABLET | Freq: Every day | ORAL | Status: DC
  Administered 2024-03-05 – 2024-03-06 (×2): 50 ug via ORAL
  Filled 2024-03-04 (×2): qty 1

## 2024-03-04 MED ORDER — SODIUM BICARBONATE 650 MG PO TABS
650.0000 mg | ORAL_TABLET | Freq: Two times a day (BID) | ORAL | Status: DC
  Administered 2024-03-04 – 2024-03-06 (×4): 650 mg via ORAL
  Filled 2024-03-04 (×4): qty 1

## 2024-03-04 MED ORDER — ACETAMINOPHEN 500 MG PO TABS
1000.0000 mg | ORAL_TABLET | Freq: Once | ORAL | Status: AC
  Administered 2024-03-04: 1000 mg via ORAL
  Filled 2024-03-04: qty 2

## 2024-03-04 MED ORDER — SODIUM CHLORIDE 0.9 % IV SOLN
1.0000 g | INTRAVENOUS | Status: DC
  Administered 2024-03-04: 1 g via INTRAVENOUS
  Filled 2024-03-04: qty 10

## 2024-03-04 MED ORDER — ROSUVASTATIN CALCIUM 10 MG PO TABS
10.0000 mg | ORAL_TABLET | Freq: Every day | ORAL | Status: DC
  Administered 2024-03-04 – 2024-03-06 (×3): 10 mg via ORAL
  Filled 2024-03-04 (×3): qty 1

## 2024-03-04 MED ORDER — CEFDINIR 300 MG PO CAPS: 300.0000 mg | ORAL_CAPSULE | Freq: Every day | ORAL | 0 refills | Status: DC

## 2024-03-04 MED ORDER — FAMOTIDINE 20 MG PO TABS
20.0000 mg | ORAL_TABLET | Freq: Every day | ORAL | Status: DC
  Administered 2024-03-04 – 2024-03-06 (×3): 20 mg via ORAL
  Filled 2024-03-04 (×3): qty 1

## 2024-03-04 NOTE — ED Provider Notes (Signed)
 [facility] Provider Note    Event Date/Time   First MD Initiated Contact with Patient 03/04/24 0222     (approximate)   History   Chief Complaint: Tremors and Nausea   HPI  Karla Little is a 89 y.o. female with a history of hypertension and CKD who comes ED complaining of chills, nausea that started tonight.  Denies pain shortness of breath cough vomiting or diarrhea.  Has been eating normally lately, no known sick contacts.  Denies dysuria.        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   Osteoporosis    Renal insufficiency    Vertigo     Current Outpatient Rx   Order #: [id number] Class: Normal   Order #: [id number] Class: Normal   Order #: [id number] Class: Historical Med   Order #: [id number] Class: Normal   Order #: [id number] Class: Normal   Order #: [id number] Class: Normal   Order #: [id number] Class: Normal   Order #: [id number] Class: Normal   Order #: [id number] Class: Normal   Order #: [id number] Class: Historical Med   Order #: [id number] Class: Historical Med   Order #: [id number] Class: Historical Med    Past Surgical History:  Procedure Laterality Date   BREAST BIOPSY     CATARACT EXTRACTION W/PHACO Left 09/20/2021   Procedure: CATARACT EXTRACTION PHACO AND INTRAOCULAR LENS PLACEMENT (IOC) LEFT;  Surgeon: Mittie Gaskin, MD;  Location: [facility];  Service: Ophthalmology;  Laterality: Left;  21.54 01:50.0   COLONOSCOPY W/ POLYPECTOMY     COLONOSCOPY WITH PROPOFOL  N/A 06/02/2015   Procedure: COLONOSCOPY WITH PROPOFOL ;  Surgeon: Lamar ONEIDA Holmes, MD;  Location: [facility] ENDOSCOPY;  Service: Endoscopy;  Laterality: N/A;    Physical Exam   Triage Vital Signs: ED Triage Vitals  Encounter Vitals Group     BP 03/04/24 0221 (!) 185/63     Girls Systolic BP Percentile --      Girls Diastolic  BP Percentile --      Boys Systolic BP Percentile --      Boys Diastolic BP Percentile --      Pulse Rate 03/04/24 0221 89     Resp 03/04/24 0221 19     Temp 03/04/24 0221 (!) 101.6 F (38.7 C)     Temp Source 03/04/24 0221 Oral     SpO2 03/04/24 0216 100 %     Weight 03/04/24 0223 160 lb (72.6 kg)     Height 03/04/24 0223 5' 9 (1.753 m)     Head Circumference --      Peak Flow --      Pain Score 03/04/24 0223 0     Pain Loc --      Pain Education --      Exclude from Growth Chart --     Most recent vital signs: Vitals:   03/04/24 0221 03/04/24 0530  BP: (!) 185/63 (!) 151/59  Pulse: 89 75  Resp: 19 16  Temp: (!) 101.6 F (38.7 C) 99 F (37.2 C)  SpO2: 96% 95%    General: Awake, no distress.  CV:  Good peripheral perfusion.  Regular rate rhythm Resp:  Normal effort.  Clear lungs Abd:  No distention.  Soft nontender Other:  Dry oral mucosa.   ED Results / Procedures / Treatments   Labs (all labs ordered are  listed, but only abnormal results are displayed) Labs Reviewed  COMPREHENSIVE METABOLIC PANEL WITH GFR - Abnormal; Notable for the following components:      Result Value   Glucose, Bld 141 (*)    BUN 31 (*)    Creatinine, Ser 2.14 (*)    GFR, Estimated 22 (*)    All other components within normal limits  CBC WITH DIFFERENTIAL/PLATELET - Abnormal; Notable for the following components:   Lymphs Abs 0.3 (*)    Monocytes Absolute 0.0 (*)    All other components within normal limits  URINALYSIS, W/ REFLEX TO CULTURE (INFECTION SUSPECTED) - Abnormal; Notable for the following components:   Color, Urine YELLOW (*)    APPearance HAZY (*)    Protein, ur 30 (*)    Nitrite POSITIVE (*)    Leukocytes,Ua MODERATE (*)    Bacteria, UA RARE (*)    All other components within normal limits  RESP PANEL BY RT-PCR (RSV, FLU A&B, COVID)  RVPGX2  CULTURE, BLOOD (ROUTINE X 2)  CULTURE, BLOOD (ROUTINE X 2)  URINE CULTURE  LACTIC ACID, PLASMA     EKG Interpreted by  me Sinus rhythm rate of 84.  Right axis, right bundle branch block, no acute ischemic changes.   RADIOLOGY Chest x-ray interpreted by me, normal.  Radiology report reviewed   PROCEDURES:  Procedures   MEDICATIONS ORDERED IN ED: Medications  acetaminophen  (TYLENOL ) tablet 1,000 mg (1,000 mg Oral Given 03/04/24 0240)  cefTRIAXone  (ROCEPHIN ) 1 g in sodium chloride  0.9 % 100 mL IVPB (1 g Intravenous New Bag/Given 03/04/24 0518)     IMPRESSION / MDM / ASSESSMENT AND PLAN / ED COURSE  I reviewed the triage vital signs and the nursing notes.  DDx: UTI, pneumonia, COVID, influenza, dehydration, AKI, electrolyte derangement, viral illness  Patient's presentation is most consistent with acute presentation with potential threat to life or bodily function.  Patient presents with malaise, nausea, chills.  No focal pain, exam nonfocal.  Appears somewhat dehydrated.  Will check labs, give Tylenol  and IV fluids.  Has fever but vital signs otherwise unremarkable, not consistent with sepsis on initial assessment   ----------------------------------------- 6:12 AM on 03/04/2024 ----------------------------------------- Workup reveals UTI.  Temperature improved, other vitals remain stable.  No leukocytosis, lactate normal, no signs of sepsis/severe infection.  Discussed with patient, she is comfortable with outpatient management.  Rocephin  given in ED.  Will prescribe oral Omnicef 300 mg once daily due to her CKD 4.      FINAL CLINICAL IMPRESSION(S) / ED DIAGNOSES   Final diagnoses:  Cystitis  CKD (chronic kidney disease) stage 4, GFR 15-29 ml/min (HCC)     Rx / DC Orders   ED Discharge Orders          Ordered    cefdinir (OMNICEF) 300 MG capsule  Daily        03/04/24 0612             Note:  This document was prepared using Dragon voice recognition software and may include unintentional dictation errors.   Viviann Pastor, MD 03/04/24 [phone]

## 2024-03-04 NOTE — ED Provider Notes (Signed)
 [facility] Provider Note    Event Date/Time   First MD Initiated Contact with Patient 03/04/24 1558     (approximate)   History   Abnormal Lab   HPI  Karla Little is a 89 y.o. female with history of hypertension and CKD who presents with positive blood cultures.  The patient was seen in the ED last night, diagnosed with a UTI, but then was called today stating that her blood cultures were positive for E. coli.  The patient reports she is feeling relatively comfortable.  She had not noted a fever.  She reports dysuria and frequency.  She denies any vomiting or diarrhea.  I reviewed the past medical records.  The patient was seen early this morning with chills and nausea.  She was diagnosed with UTI and started on Omnicef.  Blood cultures are positive for E. coli.   Physical Exam   Triage Vital Signs: ED Triage Vitals [03/04/24 1544]  Encounter Vitals Group     BP (!) 120/51     Girls Systolic BP Percentile      Girls Diastolic BP Percentile      Boys Systolic BP Percentile      Boys Diastolic BP Percentile      Pulse Rate 80     Resp 18     Temp 100.3 F (37.9 C)     Temp Source Oral     SpO2 96 %     Weight 160 lb (72.6 kg)     Height 5' 9 (1.753 m)     Head Circumference      Peak Flow      Pain Score 0     Pain Loc      Pain Education      Exclude from Growth Chart     Most recent vital signs: Vitals:   03/04/24 1544  BP: (!) 120/51  Pulse: 80  Resp: 18  Temp: 100.3 F (37.9 C)  SpO2: 96%     General: Alert, relatively well-appearing, no distress.  CV:  Good peripheral perfusion.  Resp:  Normal effort.  Abd:  No distention.  Other:  No peripheral edema.   ED Results / Procedures / Treatments   Labs (all labs ordered are listed, but only abnormal results are displayed) Labs Reviewed  BASIC METABOLIC PANEL WITH GFR - Abnormal; Notable for the following components:      Result Value   Glucose, Bld 123 (*)    BUN  36 (*)    Creatinine, Ser 2.63 (*)    GFR, Estimated 17 (*)    All other components within normal limits  CBC WITH DIFFERENTIAL/PLATELET - Abnormal; Notable for the following components:   WBC 12.5 (*)    RBC 3.68 (*)    Hemoglobin 11.7 (*)    HCT 34.8 (*)    Neutro Abs 10.9 (*)    All other components within normal limits  CULTURE, BLOOD (ROUTINE X 2)  CULTURE, BLOOD (ROUTINE X 2)  LACTIC ACID, PLASMA  LACTIC ACID, PLASMA     EKG    RADIOLOGY    PROCEDURES:  Critical Care performed: No  Procedures   MEDICATIONS ORDERED IN ED: Medications  cefTRIAXone  (ROCEPHIN ) 1 g in sodium chloride  0.9 % 100 mL IVPB (1 g Intravenous New Bag/Given 03/04/24 1644)     IMPRESSION / MDM / ASSESSMENT AND PLAN / ED COURSE  I reviewed the triage vital signs and the nursing notes.  89 year old female  with PMH as noted above who presents due to positive blood cultures.  She was diagnosed with a UTI last night and started on Omnicef.  The blood cultures are positive for E. coli.  On exam today the patient has a low-grade fever.  Other vital signs are normal and she is relatively well-appearing.  Differential diagnosis includes, but is not limited to, UTI, bacteremia, sepsis.  Although the patient's clinical status is reassuring, given that the blood cultures are consistent with her suspected source of infection and she has a low-grade fever, I am concerned for bacteremia.  I recommended IV antibiotics, repeat lab workup, and admission.  Patient's presentation is most consistent with acute presentation with potential threat to life or bodily function.  The patient is on the cardiac monitor to evaluate for evidence of arrhythmia and/or significant heart rate changes.   ----------------------------------------- 5:37 PM on 03/04/2024 -----------------------------------------  CBC is significant for leukocytosis.  BMP shows creatinine consistent with baseline.  I have ordered IV ceftriaxone .   I consulted the hospitalist; based on our discussion she agrees to evaluate the patient for admission.   FINAL CLINICAL IMPRESSION(S) / ED DIAGNOSES   Final diagnoses:  Bacteremia     Rx / DC Orders   ED Discharge Orders     None        Note:  This document was prepared using Dragon voice recognition software and may include unintentional dictation errors.    Jacolyn Pae, MD 03/04/24 [phone]

## 2024-03-04 NOTE — ED Triage Notes (Signed)
 Pt arrived from home via [facility] d/t nausea and tremors. EMS reports that the patient started having nausea and tremors for about 1 hr prior to calling EMS. EMS reports dry heaving but no vomiting. EMS reports giving 4mg  of zofran .

## 2024-03-04 NOTE — H&P (Signed)
 History and Physical    Patient: Karla Little FMW:[id number] DOB: 11/11/1933 DOA: 03/04/2024 DOS: the patient was seen and examined on 03/04/2024 PCP: Valerio Melanie DASEN, NP  Patient coming from: Home  Chief Complaint:  Chief Complaint  Patient presents with   Abnormal Lab   HPI: Karla Little is a 89 y.o. female with medical history significant of HTN, HLD, CKD, Hypothyroidism presented due to chills, nausea that started overnight. Denies dysuria, SOB, cough, vomiting, diarrhea, abd pain/flank pain. Patient was evaluated in the ER and discharged home on Omnicef for UTI. Was asked to return to the ER due to positive blood cultures for E. Coli. Denies any recent UTIs, no urological complications.  Patient lives alone, independent at baseline, intermittently uses cane.  On presentation to ED temp 100.3 F, RR 16, HR 70s, BP 151/59 , saturating 96% on room air. Workup revealed CMP with Cr 2.14, otherwise unremarkable.  LA not elevated.  CBC with leukocytosis 12.5, Hb 11.7.  COVID/flu/RSV negative. UA moderate leukocyte esterase, nitrate positive, > 50 wbc.  Blood culture panel detected E. coli/Enterobacterales.  Urine culture pending. CXR negative for acute changes. Was given 1 g IV ceftriaxone  in ER, repeat blood cultures to be collected, LA  Review of Systems: As mentioned in the history of present illness. All other systems reviewed and are negative. 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   Osteoporosis    Renal insufficiency    Vertigo    Past Surgical History:  Procedure Laterality Date   BREAST BIOPSY     CATARACT EXTRACTION W/PHACO Left 09/20/2021   Procedure: CATARACT EXTRACTION PHACO AND INTRAOCULAR LENS PLACEMENT (IOC) LEFT;  Surgeon: Mittie Gaskin, MD;  Location: [facility];  Service: Ophthalmology;  Laterality:  Left;  21.54 01:50.0   COLONOSCOPY W/ POLYPECTOMY     COLONOSCOPY WITH PROPOFOL  N/A 06/02/2015   Procedure: COLONOSCOPY WITH PROPOFOL ;  Surgeon: Lamar DASEN Holmes, MD;  Location: [facility] END[facility];  Service: Endoscopy;  Laterality: N/A;   Social History:  reports that she has never smoked. She has never been exposed to tobacco smoke. She has never used smokeless tobacco. She reports that she does not drink alcohol and does not use drugs.  Allergies  Allergen Reactions   Amoxicillin  Rash    Did it involve swelling of the face/tongue/throat, SOB, or low BP? Yes Did it involve sudden or severe rash/hives, skin peeling, or any reaction on the inside of your mouth or nose? No Did you need to seek medical attention at a hospital or doctor's office? No When did it last happen?      07/2018 If all above answers are "NO", may proceed with cephalosporin use.    Sod Citrate-Citric Acid Other (See Comments)    Family History  Problem Relation Age of Onset   Hypertension Mother    Heart disease Father    Breast cancer Sister     Prior to Admission medications   Medication Sig Start Date End Date Taking? Authorizing Provider  amLODipine  (NORVASC ) 10 MG tablet Take 1 tablet (10 mg total) by mouth daily. 08/05/23   Cannady, Jolene T, NP  calcitRIOL (ROCALTROL) 0.25 MCG capsule Take 0.25 mcg by mouth daily.  11/24/15   [provider]  cefdinir (OMNICEF) 300 MG capsule Take 1 capsule (300 mg total) by  mouth daily for 5 days. 03/04/24 03/09/24  Viviann Pastor, MD  famotidine  (PEPCID ) 20 MG tablet TAKE 1 TABLET BY MOUTH EVERYDAY AT BEDTIME 02/11/23   Cannady, Jolene T, NP  hydrALAZINE  (APRESOLINE ) 25 MG tablet Take 1 tablet (25 mg total) by mouth 3 (three) times daily. 08/05/23   Cannady, Jolene T, NP  levothyroxine  (SYNTHROID ) 50 MCG tablet Take 1 tablet (50 mcg total) by mouth daily. 02/11/23   Cannady, Jolene T, NP  lisinopril  (ZESTRIL ) 10 MG tablet Take 1 tablet (10 mg total) by mouth daily. 08/05/23    Cannady, Jolene T, NP  mupirocin  ointment (BACTROBAN ) 2 % Apply 1 Application topically 2 (two) times daily. 02/05/24   Cannady, Jolene T, NP  rosuvastatin  (CRESTOR ) 10 MG tablet Take 1 tablet (10 mg total) by mouth daily. 08/05/23   Cannady, Jolene T, NP  sodium bicarbonate  650 MG tablet Take 650 mg by mouth 2 (two) times daily.    [provider]  vitamin B-12 (CYANOCOBALAMIN ) 250 MCG tablet Take 500 mcg by mouth daily.     [provider]  Vitamin D3 (VITAMIN D ) 25 MCG tablet Take 1,000 Units by mouth daily.    [provider]    Physical Exam: Vitals:   03/04/24 1544  BP: (!) 120/51  Pulse: 80  Resp: 18  Temp: 100.3 F (37.9 C)  TempSrc: Oral  SpO2: 96%  Weight: 72.6 kg  Height: 5' 9 (1.753 m)   General:  No acute distress, comfortable CV: RRR, no murmurs heard Resp:  CTAB Abd: Soft, non tender, non distended. No CVA tenderness Ext: No peripheral edema Neuro: No gross focal deficits  Data Reviewed: Labs and Imaging reviewed  Assessment and Plan: E. coli bacteremia UTI - Mild leukocytosis, LA not elevated - Denies any recent UTIs, no urological complications - Bcx: E. coli/Enterobacterales - Continue IV Ceftriaxone  - Follow Urine culture, blood culture  HTN - Resume Amlodipine , Lisinopril  and Hydralazine  as BP tolerates after pharmacy med rec  HLD - statin  Hypothyroidism - Continue Levothyroxine   CKD stage 4 - Monitor Cr - Bicarb tabs    Advance Care Planning:   Code Status: Prior Full code - discussed with patient and grandson at the bedside  Consults: None  Family Communication: Grandson at the bedside  Severity of Illness: The appropriate patient status for this patient is OBSERVATION. Observation status is judged to be reasonable and necessary in order to provide the required intensity of service to ensure the patient's safety. The patient's presenting symptoms, physical exam findings, and initial radiographic and  laboratory data in the context of their medical condition is felt to place them at decreased risk for further clinical deterioration. Furthermore, it is anticipated that the patient will be medically stable for discharge from the hospital within 2 midnights of admission.   Author: Laree Lock, MD 03/04/2024 5:15 PM  For on call review [email or web address].

## 2024-03-04 NOTE — ED Triage Notes (Signed)
 Patient called to come back to ER after positive blood cultures for E-coli. Denies pain. Reports some nausea

## 2024-03-04 NOTE — Telephone Encounter (Signed)
 Called and spoke with pts daughter, Thedford, and informed her that pt needs to come back to the ED due to 4/4 blood culture bottles coming back positive for E. Coli.Staci states that she will bring pt back.

## 2024-03-05 ENCOUNTER — Observation Stay

## 2024-03-05 DIAGNOSIS — N1330 Unspecified hydronephrosis: Secondary | ICD-10-CM | POA: Diagnosis not present

## 2024-03-05 DIAGNOSIS — E785 Hyperlipidemia, unspecified: Secondary | ICD-10-CM | POA: Diagnosis not present

## 2024-03-05 DIAGNOSIS — N281 Cyst of kidney, acquired: Secondary | ICD-10-CM | POA: Diagnosis not present

## 2024-03-05 DIAGNOSIS — I700 Atherosclerosis of aorta: Secondary | ICD-10-CM | POA: Diagnosis not present

## 2024-03-05 DIAGNOSIS — N3090 Cystitis, unspecified without hematuria: Secondary | ICD-10-CM | POA: Diagnosis not present

## 2024-03-05 DIAGNOSIS — Z8249 Family history of ischemic heart disease and other diseases of the circulatory system: Secondary | ICD-10-CM | POA: Diagnosis not present

## 2024-03-05 DIAGNOSIS — M810 Age-related osteoporosis without current pathological fracture: Secondary | ICD-10-CM | POA: Diagnosis not present

## 2024-03-05 DIAGNOSIS — N184 Chronic kidney disease, stage 4 (severe): Secondary | ICD-10-CM | POA: Diagnosis not present

## 2024-03-05 DIAGNOSIS — Z79890 Hormone replacement therapy (postmenopausal): Secondary | ICD-10-CM | POA: Diagnosis not present

## 2024-03-05 DIAGNOSIS — I129 Hypertensive chronic kidney disease with stage 1 through stage 4 chronic kidney disease, or unspecified chronic kidney disease: Secondary | ICD-10-CM | POA: Diagnosis not present

## 2024-03-05 DIAGNOSIS — K449 Diaphragmatic hernia without obstruction or gangrene: Secondary | ICD-10-CM | POA: Diagnosis not present

## 2024-03-05 DIAGNOSIS — N179 Acute kidney failure, unspecified: Secondary | ICD-10-CM | POA: Diagnosis not present

## 2024-03-05 DIAGNOSIS — E039 Hypothyroidism, unspecified: Secondary | ICD-10-CM | POA: Diagnosis not present

## 2024-03-05 DIAGNOSIS — Z1152 Encounter for screening for COVID-19: Secondary | ICD-10-CM | POA: Diagnosis not present

## 2024-03-05 DIAGNOSIS — R7881 Bacteremia: Secondary | ICD-10-CM | POA: Diagnosis not present

## 2024-03-05 DIAGNOSIS — Z803 Family history of malignant neoplasm of breast: Secondary | ICD-10-CM | POA: Diagnosis not present

## 2024-03-05 DIAGNOSIS — J449 Chronic obstructive pulmonary disease, unspecified: Secondary | ICD-10-CM | POA: Diagnosis not present

## 2024-03-05 DIAGNOSIS — B9620 Unspecified Escherichia coli [E. coli] as the cause of diseases classified elsewhere: Secondary | ICD-10-CM | POA: Diagnosis not present

## 2024-03-05 LAB — CBC
HCT: 33 % — ABNORMAL LOW (ref 36.0–46.0)
Hemoglobin: 11 g/dL — ABNORMAL LOW (ref 12.0–15.0)
MCH: 31.6 pg (ref 26.0–34.0)
MCHC: 33.3 g/dL (ref 30.0–36.0)
MCV: 94.8 fL (ref 80.0–100.0)
Platelets: 159 K/uL (ref 150–400)
RBC: 3.48 MIL/uL — ABNORMAL LOW (ref 3.87–5.11)
RDW: 13.2 % (ref 11.5–15.5)
WBC: 9.7 K/uL (ref 4.0–10.5)
nRBC: 0 % (ref 0.0–0.2)

## 2024-03-05 LAB — BASIC METABOLIC PANEL WITH GFR
Anion gap: 5 (ref 5–15)
BUN: 34 mg/dL — ABNORMAL HIGH (ref 8–23)
CO2: 27 mmol/L (ref 22–32)
Calcium: 9.1 mg/dL (ref 8.9–10.3)
Chloride: 109 mmol/L (ref 98–111)
Creatinine, Ser: 2.3 mg/dL — ABNORMAL HIGH (ref 0.44–1.00)
GFR, Estimated: 20 mL/min — ABNORMAL LOW (ref >60)
Glucose, Bld: 95 mg/dL (ref 70–99)
Potassium: 4.7 mmol/L (ref 3.5–5.1)
Sodium: 141 mmol/L (ref 135–145)

## 2024-03-05 MED ORDER — LACTATED RINGERS IV SOLN: INTRAVENOUS | Status: AC

## 2024-03-05 MED ORDER — CALCITRIOL 0.25 MCG PO CAPS
0.2500 ug | ORAL_CAPSULE | Freq: Every day | ORAL | Status: DC
  Administered 2024-03-05 – 2024-03-06 (×2): 0.25 ug via ORAL
  Filled 2024-03-05 (×2): qty 1

## 2024-03-05 MED ORDER — CEFTRIAXONE SODIUM 2 G IJ SOLR
2.0000 g | INTRAMUSCULAR | Status: DC
  Administered 2024-03-05 – 2024-03-06 (×2): 2 g via INTRAVENOUS
  Filled 2024-03-05 (×2): qty 20

## 2024-03-05 NOTE — Plan of Care (Signed)
  Problem: Education: Goal: Knowledge of General Education information will improve Description: Including pain rating scale, medication(s)/side effects and non-pharmacologic comfort measures Outcome: Progressing   Problem: Clinical Measurements: Goal: Will remain free from infection Outcome: Progressing   Problem: Clinical Measurements: Goal: Respiratory complications will improve Outcome: Progressing   Problem: Clinical Measurements: Goal: Cardiovascular complication will be avoided Outcome: Progressing   Problem: Activity: Goal: Risk for activity intolerance will decrease Outcome: Progressing   Problem: Safety: Goal: Ability to remain free from injury will improve Outcome: Progressing

## 2024-03-05 NOTE — Progress Notes (Signed)
..    Medical record reviewed and patient has no TOC needs at this time. Please outreach to [facility] if needs are identified.      Patient Goals and CMS Choice        Expected Discharge Plan and Services                                                Prior Living Arrangements/Services                       Activities of Daily Living   ADL Screening (condition at time of admission) Independently performs ADLs?: Yes (appropriate for developmental age) Is the patient deaf or have difficulty hearing?: Yes Does the patient have difficulty seeing, even when wearing glasses/contacts?: No Does the patient have difficulty concentrating, remembering, or making decisions?: No  Permission Sought/Granted                  Emotional Assessment              Admission diagnosis:  Bacteremia [R78.81] Patient Active Problem List   Diagnosis Date Noted   Bacteremia 03/04/2024   Cellulitis of right ankle 02/05/2024   Orthostatic dizziness 08/05/2023   Aortic atherosclerosis 07/08/2020   Secondary hyperparathyroidism of renal origin 06/10/2019   Advanced care planning/counseling discussion 06/24/2017   Hypothyroidism 05/05/2015   Hyperlipidemia 05/05/2015   Atrophy of right kidney 03/16/2015   Chronic kidney disease, stage 4, severely decreased GFR (HCC) 03/16/2015   Essential hypertension    PCP:  Valerio Melanie DASEN, NP Pharmacy:   [facility] [phone] - [place], [place] - [address] [place] [address] Phone: [phone] Fax: [phone]     Social Determinants of Health (SDOH) Interventions    Readmission Risk Interventions     No data to display

## 2024-03-05 NOTE — Progress Notes (Signed)
 PROGRESS NOTE    Karla Little  FMW:[id number] DOB: 1934/05/07 DOA: 03/04/2024 PCP: Karla Melanie DASEN, NP  Chief Complaint  Patient presents with   Abnormal Lab    Hospital Course:  Karla Little is a 89 y.o. female with medical history significant of HTN, HLD, CKD, Hypothyroidism presented due to chills, nausea, denies dysuria, abd pain. On presentation to ER was found to be febrile, had UTI and was discharged on p.o. antibiotics. Was asked to return to the ER due to positive blood cultures for E. Coli.  Admitted for further management of bacteremia, UTI  Subjective: Patient was examined at the bedside daughter present States she is doing well, denies any complaints today. Encouraged p.o. intake   Objective: Vitals:   03/04/24 2151 03/04/24 2152 03/05/24 0417 03/05/24 0717  BP: (!) 152/55  (!) 113/49 (!) 124/59  Pulse: 70  67 64  Resp: 16  16 16   Temp: 98.8 F (37.1 C)  97.9 F (36.6 C) 99 F (37.2 C)  TempSrc:    Oral  SpO2: 99%  96% 98%  Weight:  74.5 kg    Height:  5' 9 (1.753 m)      Intake/Output Summary (Last 24 hours) at 03/05/2024 0758 Last data filed at 03/04/2024 2345 Gross per 24 hour  Intake 338.27 ml  Output --  Net 338.27 ml   Filed Weights   03/04/24 1544 03/04/24 2152  Weight: 72.6 kg 74.5 kg    Examination: General:  No acute distress, comfortable CV: RRR, no murmurs heard Resp:  CTAB Abd: Soft, non tender, non distended. No CVA tenderness Ext: No peripheral edema Neuro: No gross focal deficits  Assessment & Plan:  E. coli bacteremia UTI - Leukocytosis resolved - Denies any recent UTIs, no urological complications - Bcx 10/01: E. coli/Enterobacterales - US  Kidneys: No hydronephrosis, small simple renal cysts - Continue IV Ceftriaxone  - Follow Urine culture, blood culture   HTN - Resume Amlodipine , Lisinopril  and Hydralazine  as BP tolerates   HLD - statin   Hypothyroidism - Continue Levothyroxine    AKI on CKD stage 4 -  Gentle IV fluids, Cr 2.63 -> 2.30, Baseline Cr ~ 2.1 - Bicarb tabs - Monitor Cr  --Able to ambulate, at baseline  DVT prophylaxis: Lovenox  SQ   Code Status: Full Code - as per patient and daughter Disposition:  Home  Consultants:  None  Procedures:  None  Antimicrobials:  Anti-infectives (From admission, onward)    Start     Dose/Rate Route Frequency Ordered Stop   03/05/24 0000  cefTRIAXone  (ROCEPHIN ) 1 g in sodium chloride  0.9 % 100 mL IVPB        1 g 200 mL/hr over 30 Minutes Intravenous Every 24 hours 03/04/24 1750     03/04/24 1615  cefTRIAXone  (ROCEPHIN ) 1 g in sodium chloride  0.9 % 100 mL IVPB        1 g 200 mL/hr over 30 Minutes Intravenous  Once 03/04/24 1614 03/04/24 1719       Data Reviewed: I have personally reviewed following labs and imaging studies CBC: Recent Labs  Lab 03/04/24 0227 03/04/24 1635 03/05/24 0522  WBC 6.5 12.5* 9.7  NEUTROABS 6.1 10.9*  --   HGB 12.9 11.7* 11.0*  HCT 38.6 34.8* 33.0*  MCV 94.6 94.6 94.8  PLT 185 167 159   Basic Metabolic Panel: Recent Labs  Lab 03/04/24 0227 03/04/24 1635 03/05/24 0522  NA 138 137 141  K 3.8 4.5 4.7  CL 105 105 109  CO2 22 27 27   GLUCOSE 141* 123* 95  BUN 31* 36* 34*  CREATININE 2.14* 2.63* 2.30*  CALCIUM  9.6 9.1 9.1   GFR: Estimated Creatinine Clearance: 17.7 mL/min (A) (by C-G formula based on SCr of 2.3 mg/dL (H)). Liver Function Tests: Recent Labs  Lab 03/04/24 0227  AST 22  ALT 9  ALKPHOS 53  BILITOT 1.0  PROT 6.8  ALBUMIN 3.9   CBG: No results for input(s): GLUCAP in the last 168 hours.  Recent Results (from the past 240 hours)  Blood Culture (routine x 2)     Status: None (Preliminary result)   Collection Time: 03/04/24  2:27 AM   Specimen: BLOOD  Result Value Ref Range Status   Specimen Description   Final    BLOOD RIGHT [facility] Performed at [facility], [address], [place], [place] [address]    Special Requests   Final    BOTTLES DRAWN AEROBIC AND  ANAEROBIC Blood Culture adequate volume Performed at [facility], [address] [place], [place] [address]    Culture  Setup Time   Final    GRAM NEGATIVE RODS IN BOTH AEROBIC AND ANAEROBIC BOTTLES Organism ID to follow CRITICAL RESULT CALLED TO, READ BACK BY AND VERIFIED WITH: ROSINA ELBE, RN AT 1418 ON 03/04/24 BY GM GRAM STAIN REVIEWED-AGREE WITH RESULT DRT Performed at [facility], [address] [place], [place] [address]    Culture GRAM NEGATIVE RODS  Final   Report Status PENDING  Incomplete  Blood Culture (routine x 2)     Status: None (Preliminary result)   Collection Time: 03/04/24  2:27 AM   Specimen: BLOOD  Result Value Ref Range Status   Specimen Description   Final    BLOOD LEFT AC Performed at [facility], [address], [place], [place] [address]    Special Requests   Final    BOTTLES DRAWN AEROBIC AND ANAEROBIC Blood Culture adequate volume Performed at [facility], [address], [place], [place] [address]    Culture  Setup Time   Final    GRAM NEGATIVE RODS IN BOTH AEROBIC AND ANAEROBIC BOTTLES CRITICAL VALUE NOTED.  VALUE IS CONSISTENT WITH PREVIOUSLY REPORTED AND CALLED VALUE. GRAM STAIN REVIEWED-AGREE WITH RESULT DRT Performed at [facility], [address], [place], [place] [address]    Culture GRAM NEGATIVE RODS  Final   Report Status PENDING  Incomplete  Resp panel by RT-PCR (RSV, Flu A&B, Covid) Anterior Nasal Swab     Status: None   Collection Time: 03/04/24  2:27 AM   Specimen: Anterior Nasal Swab  Result Value Ref Range Status   SARS Coronavirus 2 by RT PCR NEGATIVE NEGATIVE Final    Comment: (NOTE) SARS-CoV-2 target nucleic acids are NOT DETECTED.  The SARS-CoV-2 RNA is generally detectable in upper respiratory specimens during the acute phase of infection. The lowest concentration of SARS-CoV-2 viral copies this assay can detect is 138 copies/mL. A negative result does not preclude  SARS-Cov-2 infection and should not be used as the sole basis for treatment or other patient management decisions. A negative result may occur with  improper specimen collection/handling, submission of specimen other than nasopharyngeal swab, presence of viral mutation(s) within the areas targeted by this assay, and inadequate number of viral copies(<138 copies/mL). A negative result must be combined with clinical observations, patient history, and epidemiological information. The expected result is Negative.  Fact Sheet for Patients:  BloggerCourse.com  Fact  Sheet for Healthcare Providers:  SeriousBroker.it  This test is no t yet approved or cleared by the [place]  FDA and  has been authorized for detection and/or diagnosis of SARS-CoV-2 by FDA under an Emergency Use Authorization (EUA). This EUA will remain  in effect (meaning this test can be used) for the duration of the COVID-19 declaration under Section 564(b)(1) of the Act, 21 U.S.C.section 360bbb-3(b)(1), unless the authorization is terminated  or revoked sooner.       Influenza A by PCR NEGATIVE NEGATIVE Final   Influenza B by PCR NEGATIVE NEGATIVE Final    Comment: (NOTE) The Xpert Xpress SARS-CoV-2/FLU/RSV plus assay is intended as an aid in the diagnosis of influenza from Nasopharyngeal swab specimens and should not be used as a sole basis for treatment. Nasal washings and aspirates are unacceptable for Xpert Xpress SARS-CoV-2/FLU/RSV testing.  Fact Sheet for Patients: BloggerCourse.com  Fact Sheet for Healthcare Providers: SeriousBroker.it  This test is not yet approved or cleared by the [place]  FDA and has been authorized for detection and/or diagnosis of SARS-CoV-2 by FDA under an Emergency Use Authorization (EUA). This EUA will remain in effect (meaning this test can be used) for the duration of  the COVID-19 declaration under Section 564(b)(1) of the Act, 21 U.S.C. section 360bbb-3(b)(1), unless the authorization is terminated or revoked.     Resp Syncytial Virus by PCR NEGATIVE NEGATIVE Final    Comment: (NOTE) Fact Sheet for Patients: BloggerCourse.com  Fact Sheet for Healthcare Providers: SeriousBroker.it  This test is not yet approved or cleared by the [place]  FDA and has been authorized for detection and/or diagnosis of SARS-CoV-2 by FDA under an Emergency Use Authorization (EUA). This EUA will remain in effect (meaning this test can be used) for the duration of the COVID-19 declaration under Section 564(b)(1) of the Act, 21 U.S.C. section 360bbb-3(b)(1), unless the authorization is terminated or revoked.  Performed at [facility], [address] [place], [place] [address]   Blood Culture ID Panel (Reflexed)     Status: Abnormal   Collection Time: 03/04/24  2:27 AM  Result Value Ref Range Status   Enterococcus faecalis NOT DETECTED NOT DETECTED Final   Enterococcus Faecium NOT DETECTED NOT DETECTED Final   Listeria monocytogenes NOT DETECTED NOT DETECTED Final   Staphylococcus species NOT DETECTED NOT DETECTED Final   Staphylococcus aureus (BCID) NOT DETECTED NOT DETECTED Final   Staphylococcus epidermidis NOT DETECTED NOT DETECTED Final   Staphylococcus lugdunensis NOT DETECTED NOT DETECTED Final   Streptococcus species NOT DETECTED NOT DETECTED Final   Streptococcus agalactiae NOT DETECTED NOT DETECTED Final   Streptococcus pneumoniae NOT DETECTED NOT DETECTED Final   Streptococcus pyogenes NOT DETECTED NOT DETECTED Final   A.calcoaceticus-baumannii NOT DETECTED NOT DETECTED Final   Bacteroides fragilis NOT DETECTED NOT DETECTED Final   Enterobacterales DETECTED (A) NOT DETECTED Final    Comment: Enterobacterales represent a large order of gram negative bacteria, not a single  organism. CRITICAL RESULT CALLED TO, READ BACK BY AND VERIFIED WITH: ROSINA ELBE, RN AT 1418 ON 03/04/24 BY GM    Enterobacter cloacae complex NOT DETECTED NOT DETECTED Final   Escherichia coli DETECTED (A) NOT DETECTED Final    Comment: CRITICAL RESULT CALLED TO, READ BACK BY AND VERIFIED WITH: ROSINA ELBE, RN AT 1418 ON 03/04/24 BY GM    Klebsiella aerogenes NOT DETECTED NOT DETECTED Final   Klebsiella oxytoca NOT DETECTED NOT DETECTED Final   Klebsiella pneumoniae NOT DETECTED NOT DETECTED Final  Proteus species NOT DETECTED NOT DETECTED Final   Salmonella species NOT DETECTED NOT DETECTED Final   Serratia marcescens NOT DETECTED NOT DETECTED Final   Haemophilus influenzae NOT DETECTED NOT DETECTED Final   Neisseria meningitidis NOT DETECTED NOT DETECTED Final   Pseudomonas aeruginosa NOT DETECTED NOT DETECTED Final   Stenotrophomonas maltophilia NOT DETECTED NOT DETECTED Final   Candida albicans NOT DETECTED NOT DETECTED Final   Candida auris NOT DETECTED NOT DETECTED Final   Candida glabrata NOT DETECTED NOT DETECTED Final   Candida krusei NOT DETECTED NOT DETECTED Final   Candida parapsilosis NOT DETECTED NOT DETECTED Final   Candida tropicalis NOT DETECTED NOT DETECTED Final   Cryptococcus neoformans/gattii NOT DETECTED NOT DETECTED Final   CTX-M ESBL NOT DETECTED NOT DETECTED Final   Carbapenem resistance IMP NOT DETECTED NOT DETECTED Final   Carbapenem resistance KPC NOT DETECTED NOT DETECTED Final   Carbapenem resistance NDM NOT DETECTED NOT DETECTED Final   Carbapenem resist OXA 48 LIKE NOT DETECTED NOT DETECTED Final   Carbapenem resistance VIM NOT DETECTED NOT DETECTED Final    Comment: Performed at [facility], [address], [place], [place] [address]     Radiology Studies: DG Chest Port 1 View Result Date: 03/04/2024 CLINICAL DATA:  Questionable sepsis.  Check for infiltrate. EXAM: PORTABLE CHEST 1 VIEW COMPARISON:  PA and lateral chest 07/07/2022.  FINDINGS: The lungs are mildly emphysematous but clear. There is a moderate sized hiatal hernia with a stable mediastinum. Mild aortic atherosclerosis. The cardiac size is normal. No vascular congestion is seen. There are several overlying telemetry leads. Osteopenia with thoracic spondylosis. IMPRESSION: 1. No evidence of acute chest disease.  Stable COPD chest. 2. Moderate sized hiatal hernia. 3. Aortic atherosclerosis. Electronically Signed   By: Francis Quam M.D.   On: 03/04/2024 03:41    Scheduled Meds:  calcitRIOL  0.25 mcg Oral Daily   enoxaparin  (LOVENOX ) injection  30 mg Subcutaneous Q24H   famotidine   20 mg Oral Daily   levothyroxine   50 mcg Oral Daily   rosuvastatin   10 mg Oral Daily   sodium bicarbonate   650 mg Oral BID   sodium chloride  flush  3 mL Intravenous Q12H   Continuous Infusions:  cefTRIAXone  (ROCEPHIN )  IV Stopped (03/04/24 2345)     LOS: 0 days  MDM: Patient is high risk for one or more organ failure.  They necessitate ongoing hospitalization for continued IV therapies and subsequent lab monitoring. Total time spent interpreting labs and vitals, reviewing the medical record, coordinating care amongst consultants and care team members, directly assessing and discussing care with the patient and/or family: 55 min Laree Lock, MD [facility]  To contact the attending physician between 7A-7P please use Epic Chat. To contact the covering physician during after hours 7P-7A, please review Amion.  03/05/2024, 7:58 AM   *This document has been created with the assistance of dictation software. Please excuse typographical errors. *

## 2024-03-05 NOTE — Care Management Obs Status (Signed)
 MEDICARE OBSERVATION STATUS NOTIFICATION   Patient Details  Name: Karla Little MRN: [id number] Date of Birth: 21-Jan-1934   Medicare Observation Status Notification Given:  Yes    Carl Bleecker W, CMA 03/05/2024, 10:33 AM

## 2024-03-06 DIAGNOSIS — R7881 Bacteremia: Secondary | ICD-10-CM | POA: Diagnosis not present

## 2024-03-06 LAB — BASIC METABOLIC PANEL WITH GFR
Anion gap: 14 (ref 5–15)
BUN: 35 mg/dL — ABNORMAL HIGH (ref 8–23)
CO2: 21 mmol/L — ABNORMAL LOW (ref 22–32)
Calcium: 9.2 mg/dL (ref 8.9–10.3)
Chloride: 104 mmol/L (ref 98–111)
Creatinine, Ser: 2.06 mg/dL — ABNORMAL HIGH (ref 0.44–1.00)
GFR, Estimated: 23 mL/min — ABNORMAL LOW (ref >60)
Glucose, Bld: 98 mg/dL (ref 70–99)
Potassium: 4.3 mmol/L (ref 3.5–5.1)
Sodium: 139 mmol/L (ref 135–145)

## 2024-03-06 LAB — CULTURE, BLOOD (ROUTINE X 2)
Special Requests: ADEQUATE
Special Requests: ADEQUATE

## 2024-03-06 MED ORDER — LEVOFLOXACIN 250 MG PO TABS: 500.0000 mg | ORAL_TABLET | ORAL | 0 refills | Status: AC

## 2024-03-06 NOTE — Discharge Summary (Signed)
 Physician Discharge Summary   Patient: Karla Little MRN: [id number] DOB: 1933/06/23  Admit date:     03/04/2024  Discharge date: 03/06/24  Discharge Physician: Laree Lock   PCP: Valerio Melanie DASEN, NP   Recommendations at discharge:   Follow up with PCP in 1 week - Repeat BMP (Cr) Monitor BP and resume antihypertensives as tolerated  Discharge Diagnoses: Principal Problem:   Bacteremia   Hospital Course: Karla Little is a 89 y.o. female with medical history significant of HTN, HLD, CKD, Hypothyroidism presented due to chills, nausea, denies dysuria, abd pain. On presentation to ER was found to be febrile, had UTI and was discharged on p.o. antibiotics. Was asked to return to the ER due to positive blood cultures for E. Coli.  Admitted for further management of bacteremia, UTI, Hospital course as below  E. coli bacteremia UTI - Leukocytosis resolved - Denies any recent UTIs, no urological complications - Bcx (2/2) 10/01: E. coli/Enterobacterales, reviewed sensitivities - Ucx 10/01: GNR, pending - Repeat Bcx 10/01: NGTD - US  Kidneys: No hydronephrosis, small simple renal cysts - was on IV Ceftriaxone , discharge on po Levofloxacin to complete 10 days - Will follow Urine culture, blood culture   HTN - Resume Amlodipine , Lisinopril  and Hydralazine  as BP tolerates - held   AKI on CKD stage 4 - resolved - s/p gentle IV fluids, Cr 2.63 -> 2.06, Baseline Cr ~ 2.1 - Bicarb tabs - Follow up outpatient    --Able to ambulate, at baseline --Discussed with daughter [place] at the bedside   Consultants: None Procedures performed: None  Disposition: Home Diet recommendation:  Discharge Diet Orders (From admission, onward)     Start     Ordered   03/06/24 0000  Diet - low sodium heart healthy        03/06/24 1300           DISCHARGE MEDICATION: Allergies as of 03/06/2024       Reactions   Amoxicillin  Rash   Did it involve swelling of the face/tongue/throat,  SOB, or low BP? Yes Did it involve sudden or severe rash/hives, skin peeling, or any reaction on the inside of your mouth or nose? No Did you need to seek medical attention at a hospital or doctor's office? No When did it last happen?      07/2018 If all above answers are "NO", may proceed with cephalosporin use.   Sod Citrate-citric Acid Other (See Comments)        Medication List     PAUSE taking these medications    amLODipine  10 MG tablet Wait to take this until your doctor or other care provider tells you to start again. Commonly known as: NORVASC  Take 1 tablet (10 mg total) by mouth daily.   hydrALAZINE  25 MG tablet Wait to take this until your doctor or other care provider tells you to start again. Commonly known as: APRESOLINE  Take 1 tablet (25 mg total) by mouth 3 (three) times daily. What changed: when to take this   lisinopril  10 MG tablet Wait to take this until your doctor or other care provider tells you to start again. Commonly known as: ZESTRIL  Take 1 tablet (10 mg total) by mouth daily.       STOP taking these medications    cefdinir 300 MG capsule Commonly known as: OMNICEF       TAKE these medications    calcitRIOL 0.25 MCG capsule Commonly known as: ROCALTROL Take 0.25 mcg by mouth daily.  cyanocobalamin  250 MCG tablet Commonly known as: VITAMIN B12 Take 500 mcg by mouth daily.   famotidine  20 MG tablet Commonly known as: PEPCID  TAKE 1 TABLET BY MOUTH EVERYDAY AT BEDTIME   levofloxacin 250 MG tablet Commonly known as: Levaquin Take 2 tablets (500 mg total) by mouth every other day for 4 doses. Start taking on: March 07, 2024   levothyroxine  50 MCG tablet Commonly known as: SYNTHROID  Take 1 tablet (50 mcg total) by mouth daily.   mupirocin  ointment 2 % Commonly known as: BACTROBAN  Apply 1 Application topically 2 (two) times daily.   rosuvastatin  10 MG tablet Commonly known as: CRESTOR  Take 1 tablet (10 mg total) by mouth  daily.   sodium bicarbonate  650 MG tablet Take 650 mg by mouth 2 (two) times daily.   vitamin D3 25 MCG tablet Commonly known as: CHOLECALCIFEROL Take 1,000 Units by mouth daily.        Discharge Exam: Filed Weights   03/04/24 1544 03/04/24 2152  Weight: 72.6 kg 74.5 kg   General:  No acute distress, comfortable CV: RRR, no murmurs heard Resp:  CTAB Abd: Soft, non tender, non distended. No CVA tenderness Ext: No peripheral edema Neuro: No gross focal deficits  Condition at discharge: good  The results of significant diagnostics from this hospitalization (including imaging, microbiology, ancillary and laboratory) are listed below for reference.   Imaging Studies: US  RENAL Result Date: 03/05/2024 EXAM: US  Retroperitoneum Complete, Renal. CLINICAL HISTORY: 6300 Hydronephrosis 6300. 6300 Hydronephrosis 6300. TECHNIQUE: Real-time ultrasound of the retroperitoneum (complete) with image documentation. COMPARISON: None provided. FINDINGS: RIGHT KIDNEY: Right kidney measures 5.9 x 3.3 x 2.3 cm. A 0.8 x 0.8 x 1.0 cm simple cyst is seen within the right kidney. No hydronephrosis or renal stone visualized. LEFT KIDNEY: The left kidney measures 9.1 x 5.1 x 5.4 cm. A 1.0 x 1.1 x 1.1 cm simple cyst is seen within the left kidney. The left kidney is lobular in contour. No hydronephrosis or renal stone visualized. BLADDER: Unremarkable as visualized. IMPRESSION: 1. No hydronephrosis. 2. Small simple renal cysts. No follow-up recommended. Electronically signed by: Suzen Dials MD 03/05/2024 01:34 PM EDT RP Workstation: [id number]   DG Chest Port 1 View Result Date: 03/04/2024 CLINICAL DATA:  Questionable sepsis.  Check for infiltrate. EXAM: PORTABLE CHEST 1 VIEW COMPARISON:  PA and lateral chest 07/07/2022. FINDINGS: The lungs are mildly emphysematous but clear. There is a moderate sized hiatal hernia with a stable mediastinum. Mild aortic atherosclerosis. The cardiac size is normal. No vascular  congestion is seen. There are several overlying telemetry leads. Osteopenia with thoracic spondylosis. IMPRESSION: 1. No evidence of acute chest disease.  Stable COPD chest. 2. Moderate sized hiatal hernia. 3. Aortic atherosclerosis. Electronically Signed   By: Francis Quam M.D.   On: 03/04/2024 03:41    Microbiology: Results for orders placed or performed during the hospital encounter of 03/04/24  Culture, blood (routine x 2)     Status: None (Preliminary result)   Collection Time: 03/04/24  4:36 PM   Specimen: BLOOD  Result Value Ref Range Status   Specimen Description BLOOD LEFT ANTECUBITAL  Final   Special Requests   Final    BOTTLES DRAWN AEROBIC AND ANAEROBIC Blood Culture adequate volume   Culture   Final    NO GROWTH 2 DAYS Performed at [facility], [address] [place], [place] [address]    Report Status PENDING  Incomplete  Culture, blood (routine x 2)     Status:  None (Preliminary result)   Collection Time: 03/04/24  4:36 PM   Specimen: BLOOD  Result Value Ref Range Status   Specimen Description BLOOD RIGHT ANTECUBITAL  Final   Special Requests   Final    BOTTLES DRAWN AEROBIC AND ANAEROBIC Blood Culture adequate volume   Culture   Final    NO GROWTH 2 DAYS Performed at [facility], [address] [place], [place] [address]    Report Status PENDING  Incomplete    Labs: CBC: Recent Labs  Lab 03/04/24 0227 03/04/24 1635 03/05/24 0522  WBC 6.5 12.5* 9.7  NEUTROABS 6.1 10.9*  --   HGB 12.9 11.7* 11.0*  HCT 38.6 34.8* 33.0*  MCV 94.6 94.6 94.8  PLT 185 167 159   Basic Metabolic Panel: Recent Labs  Lab 03/04/24 0227 03/04/24 1635 03/05/24 0522 03/06/24 0316  NA 138 137 141 139  K 3.8 4.5 4.7 4.3  CL 105 105 109 104  CO2 22 27 27  21*  GLUCOSE 141* 123* 95 98  BUN 31* 36* 34* 35*  CREATININE 2.14* 2.63* 2.30* 2.06*  CALCIUM  9.6 9.1 9.1 9.2   Liver Function Tests: Recent Labs  Lab 03/04/24 0227  AST 22  ALT 9  ALKPHOS 53   BILITOT 1.0  PROT 6.8  ALBUMIN 3.9   CBG: No results for input(s): GLUCAP in the last 168 hours.  Discharge time spent: greater than 30 minutes.  Signed: Laree Lock, MD [facility] 03/06/2024

## 2024-03-06 NOTE — Plan of Care (Signed)

## 2024-03-06 NOTE — TOC Progression Note (Signed)
 Transition of Care [facility]) - Progression Note    Patient Details  Name: Karla Little MRN: [id number] Date of Birth: May 04, 1934  [facility]) CM/SW Contact  Dalia GORMAN Fuse, RN Phone Number: 03/06/2024, 8:41 AM  Clinical Narrative:      Patient continuing to receive gently IV hydration for elevated Cr and following urine and blood cx. No TOC needs.                   Expected Discharge Plan and Services                                               Social Drivers of Health (SDOH) Interventions SDOH Screenings   Food Insecurity: No Food Insecurity (03/04/2024)  Housing: Low Risk  (03/04/2024)  Transportation Needs: No Transportation Needs (03/04/2024)  Utilities: Not At Risk (03/04/2024)  Alcohol Screen: Low Risk  (02/13/2024)  Depression (PHQ2-9): Low Risk  (02/13/2024)  Financial Resource Strain: Low Risk  (02/13/2024)  Physical Activity: Inactive (02/13/2024)  Social Connections: Socially Isolated (03/04/2024)  Stress: No Stress Concern Present (02/13/2024)  Tobacco Use: Low Risk  (03/04/2024)  Health Literacy: Adequate Health Literacy (02/13/2024)    Readmission Risk Interventions     No data to display

## 2024-03-07 LAB — URINE CULTURE: Culture: >=100000 — AB

## 2024-03-09 ENCOUNTER — Ambulatory Visit: Admitting: Nurse Practitioner

## 2024-03-09 ENCOUNTER — Ambulatory Visit: Payer: Self-pay

## 2024-03-09 LAB — CULTURE, BLOOD (ROUTINE X 2)
Culture: NO GROWTH
Culture: NO GROWTH
Special Requests: ADEQUATE
Special Requests: ADEQUATE

## 2024-03-09 NOTE — Telephone Encounter (Signed)
 FYI Only or Action Required?: FYI only for provider.  Patient was last seen in primary care on 02/12/2024 by Valerio Melanie DASEN, NP.  Called Nurse Triage reporting Advice Only.  Triage Disposition: Information or Advice Only Call  Patient/caregiver understands and will follow disposition?: Yes  Copied from CRM #[id number]. Topic: Clinical - Medication Question >> Mar 09, 2024  2:10 PM Tiffany B wrote: Reason for CRM: Caller states patient was discharged from [facility] on 10/3 and would like to discuss medications that were prescribed in the hospital and unsure if patient should continue taking medications. Caller states patient needs medication management guidance prior to 03/19/2024 hospital follow up appointment. Please call patient directly. Reason for Disposition  Health information question, no triage required and triager able to answer question  Answer Assessment - Initial Assessment Questions 1. REASON FOR CALL: What is the main reason for your call? or How can I best help you?     Patient called needing clarification on when she could started her BP medications. Patient was discharged from hospital on 10/3-reports having not taken her blood pressure medication since being in the hospital. Per discharge summary, patient was instructed to Monitor BP and restart antihypertensives as tolerated.  This was reviewed with patient and emphasized the importance of her hospital follow up visit on 03/19/2024. Patient verbalized understanding.  2. SYMPTOMS : Do you have any symptoms?      no 3. OTHER QUESTIONS: Do you have any other questions?     no  Protocols used: Information Only Call - No Triage-A-AH

## 2024-03-10 NOTE — Telephone Encounter (Signed)
 Returned call to patient. She believes she is all set after receiving assistance from the [facility] RN triage nurse. She was able to with her grandson make a chart of all her medications so she could better track to ensure she takes all she is supposed to. She has no further questions at this time and will follow up for her scheduled appointment.

## 2024-03-10 NOTE — Telephone Encounter (Signed)
 Noted

## 2024-03-15 NOTE — Patient Instructions (Signed)
 Be Involved in Caring For Your Health:  Taking Medications When medications are taken as directed, they can greatly improve your health. But if they are not taken as prescribed, they may not work. In some cases, not taking them correctly can be harmful. To help ensure your treatment remains effective and safe, understand your medications and how to take them. Bring your medications to each visit for review by your provider.  Your lab results, notes, and after visit summary will be available on My Chart. We strongly encourage you to use this feature. If lab results are abnormal the clinic will contact you with the appropriate steps. If the clinic does not contact you assume the results are satisfactory. You can always view your results on My Chart. If you have questions regarding your health or results, please contact the clinic during office hours. You can also ask questions on My Chart.  We at [facility] are grateful that you chose [place] to provide your care. We strive to provide evidence-based and compassionate care and are always looking for feedback. If you get a survey from the clinic please complete this so we can hear your opinions.  Chronic Kidney Disease: Eating Plan Chronic kidney disease (CKD) is when your kidneys aren't working well. They can't remove waste, fluids, and other substances from your blood. When these substances build up, they can worsen kidney damage and affect your health. Eating certain foods can lead to a buildup of these substances. Changing your diet can help prevent more kidney damage. Diet changes may also delay dialysis or even keep you from needing it. What nutrients should I limit? Work with your health care team and an expert in healthy eating (dietitian) to make a meal plan that's right for you. Foods you can eat and foods you should limit or avoid will depend on: The stage of your kidney disease. Any other conditions you have. The items listed below  are not a complete list. Talk with a dietitian to learn what's best for you. Potassium Potassium affects how well your heart beats. Too much potassium in your blood can cause an irregular heartbeat or even a heart attack. You may need to limit foods that are high in potassium, such as: Liquid milk and soy milk. Salt substitutes that contain potassium. Fruits like: Bananas. Apricots. Melon. Prunes and raisins. Kiwi. Nectarines and oranges. Vegetables, such as: Potatoes, sweet potatoes, and yams. Tomatoes. Leafy greens. Beets. Avocado. Pumpkin and winter squash. Beans, like lima beans. Nuts. Phosphorus Phosphorus is a mineral found in your bones. You need a balance between calcium and phosphorus to build and maintain healthy bones.  Too much added phosphorus from the foods you eat can pull calcium from your bones. Losing calcium can make your bones weak and more likely to break. Too much phosphorus can also make your skin itch. You may need to limit foods that are high in phosphorus or that have added phosphorus, such as: Liquid milk and dairy products. Dark-colored sodas or soft drinks. Bran cereals and oatmeal. Protein  Protein helps your body make and keep muscle. Protein also helps to repair your body's cells and tissues.  One of the natural breakdown products of protein is a waste product called urea. When your kidneys aren't working well, they can't remove waste like urea. Reducing protein in your diet can help keep urea from building up in your blood. Depending on your stage of kidney disease, you may need to eat smaller portions of foods that  are high in protein. Sources of animal protein include: Meat (all types). Fish and seafood. Poultry. Eggs. Dairy. Other protein foods include: Beans and legumes. Nuts and nut butter. Soy, like tofu.  Sodium Salt (sodium) helps to keep a healthy balance of fluids in your body. Too much salt can increase your blood pressure,  which can harm your heart and lungs. Extra salt can also cause your body to keep too much fluid, making your kidneys work harder. You may need to limit or avoid foods that are high in salt, such as: Salt seasonings. Soy and teriyaki sauce. Meats that are: Packaged. Precooked. Cured. Processed. Salted crackers and snack foods. Fast food. Canned soups and foods. Pickled foods. Boxed mixes or ready-to-eat boxed meals and side dishes. Bottled dressings, sauces, and marinades. Talk with your dietitian about how much potassium, phosphorus, protein, and salt you may have each day. What are tips for following this plan? Reading food labels  Check the amount of salt in foods. Limit foods that have salt listed among the first five ingredients. Try to eat low-salt foods. Check the ingredient list for added phosphorus or potassium. "Phos" in an ingredient is a sign that phosphorus has been added. Do not buy foods that are calcium-enriched or that have calcium added to them (fortified). Buy canned vegetables and beans that say "no salt added." Rinse them before eating. Lifestyle Limit the amount of protein you eat from animal sources each day. Focus on protein from plant sources, like tofu and dried beans, peas, and lentils. Do not add salt to food when cooking or before eating. Do not eat star fruit. It can be toxic for people with kidney problems. Talk with your health care provider before taking any vitamin or mineral supplements. If told by your provider: Track how much liquid you have so you can avoid drinking too much. Try to eat foods that are made mostly from water, like gelatin, ice cream, soups, and juicy fruits and vegetables. If you have diabetes and chronic kidney disease: If you have diabetes and CKD, you need to keep your blood sugar (glucose) in the target range recommended by your provider. Follow your diabetes management plan. This may include: Checking your blood glucose  regularly. Taking medicines by mouth, or taking insulin, or both. Exercising for at least 30 minutes on 5 or more days each week, or as told by your provider. Tracking how many servings of carbohydrates you eat at each meal. Not using orange juice to treat low blood sugars. Instead, use apple juice, cranberry juice, or clear soda. You may be given guidelines on what foods and nutrients you may eat, and how much you can have each day. This depends on your stage of kidney disease and whether you have high blood pressure. Follow the meal plan your dietitian gives you. Where to find more information [employer] of Diabetes and Digestive and Kidney Diseases: [employer]Sync.si National Kidney Foundation: kidney.org This information is not intended to replace advice given to you by your health care provider. Make sure you discuss any questions you have with your health care provider. Document Revised: 01/01/2023 Document Reviewed: 01/01/2023 Elsevier Patient Education  2024 [employer].

## 2024-03-19 ENCOUNTER — Encounter: Payer: Self-pay | Admitting: Nurse Practitioner

## 2024-03-19 ENCOUNTER — Ambulatory Visit (INDEPENDENT_AMBULATORY_CARE_PROVIDER_SITE_OTHER): Admitting: Nurse Practitioner

## 2024-03-19 VITALS — BP 132/70 | HR 66 | Temp 98.1°F | Resp 15 | Ht 69.02 in | Wt 159.8 lb

## 2024-03-19 DIAGNOSIS — E782 Mixed hyperlipidemia: Secondary | ICD-10-CM

## 2024-03-19 DIAGNOSIS — R7881 Bacteremia: Secondary | ICD-10-CM

## 2024-03-19 DIAGNOSIS — N184 Chronic kidney disease, stage 4 (severe): Secondary | ICD-10-CM | POA: Diagnosis not present

## 2024-03-19 DIAGNOSIS — R8281 Pyuria: Secondary | ICD-10-CM | POA: Diagnosis not present

## 2024-03-19 DIAGNOSIS — E039 Hypothyroidism, unspecified: Secondary | ICD-10-CM | POA: Diagnosis not present

## 2024-03-19 DIAGNOSIS — K449 Diaphragmatic hernia without obstruction or gangrene: Secondary | ICD-10-CM | POA: Insufficient documentation

## 2024-03-19 DIAGNOSIS — I10 Essential (primary) hypertension: Secondary | ICD-10-CM

## 2024-03-19 DIAGNOSIS — N2581 Secondary hyperparathyroidism of renal origin: Secondary | ICD-10-CM | POA: Diagnosis not present

## 2024-03-19 MED ORDER — HYDRALAZINE HCL 25 MG PO TABS: 25.0000 mg | ORAL_TABLET | Freq: Every day | ORAL | Status: AC

## 2024-03-19 NOTE — Progress Notes (Signed)
 BP 132/70 (BP Location: Left Arm, Patient Position: Sitting, Cuff Size: Normal)   Pulse 66   Temp 98.1 F (36.7 C) (Oral)   Resp 15   Ht 5' 9.02 (1.753 m)   Wt 159 lb 12.8 oz (72.5 kg)   SpO2 99%   BMI 23.59 kg/m    Subjective:    Patient ID: [name], female    DOB: [birth year], 89 y.o.   MRN: [id number]  HPI: ZHANAE PROFFIT is a 89 y.o. female  Chief Complaint  Patient presents with   Hospitalization Follow-up    IP admission for Bacteremia 03/04/2024-03/06/2024. Overall feels good but does say at times when standing she may get a bit dizzy and wobbly.    Transition of Care Hospital Follow up.  Presents today for hospital follow-up.  Was admitted on 03/04/24 for UTI and then discharged 03/06/24.  She reports prior to admission was having tremors and not feeling well.  Since being home has been feeling a little bit better, still a little wobbly when first gets up, but overall doing well.  Follows with nephrology, Dr. Marcelino, last visit was 12/25/23. No recent falls.  Does not have PT and OT coming into home, not interested in this right now.  Hospital Course: IRVA LOSER is a 89 y.o. female with medical history significant of HTN, HLD, CKD, Hypothyroidism presented due to chills, nausea, denies dysuria, abd pain. On presentation to ER was found to be febrile, had UTI and was discharged on p.o. antibiotics. Was asked to return to the ER due to positive blood cultures for E. Coli.  Admitted for further management of bacteremia, UTI, Hospital course as below   E. coli bacteremia UTI - Leukocytosis resolved - Denies any recent UTIs, no urological complications - Bcx (2/2) 10/01: E. coli/Enterobacterales, reviewed sensitivities - Ucx 10/01: GNR, pending - Repeat Bcx 10/01: NGTD - US  Kidneys: No hydronephrosis, small simple renal cysts - was on IV Ceftriaxone , discharge on po Levofloxacin to complete 10 days - Will follow Urine culture, blood culture   HTN -  Resume Amlodipine , Lisinopril  and Hydralazine  as BP tolerates - held   AKI on CKD stage 4 - resolved - s/p gentle IV fluids, Cr 2.63 -> 2.06, Baseline Cr ~ 2.1 - Bicarb tabs - Follow up outpatient     --Able to ambulate, at baseline --Discussed with daughter Thedford at the bedside  Hospital/Facility: [facility] D/C Physician: Dr. Jerelene D/C Date: 03/06/24  Records Requested: 03/19/24 Records Received: 03/19/24 Records Reviewed: 03/19/24  Diagnoses on Discharge: Bacteremia   Date of interactive Contact within 48 hours of discharge:  Contact was through: no call present  Date of 7 day or 14 day face-to-face visit:    within 14 days  Outpatient Encounter Medications as of 03/19/2024  Medication Sig   amLODipine  (NORVASC ) 10 MG tablet Take 1 tablet (10 mg total) by mouth daily.   calcitRIOL (ROCALTROL) 0.25 MCG capsule Take 0.25 mcg by mouth daily.    famotidine  (PEPCID ) 20 MG tablet TAKE 1 TABLET BY MOUTH EVERYDAY AT BEDTIME   levothyroxine  (SYNTHROID ) 50 MCG tablet Take 1 tablet (50 mcg total) by mouth daily.   lisinopril  (ZESTRIL ) 10 MG tablet Take 1 tablet (10 mg total) by mouth daily.   mupirocin  ointment (BACTROBAN ) 2 % Apply 1 Application topically 2 (two) times daily.   rosuvastatin  (CRESTOR ) 10 MG tablet Take 1 tablet (10 mg total) by mouth daily.   sodium bicarbonate  650 MG tablet Take 650 mg  by mouth 2 (two) times daily.   vitamin B-12 (CYANOCOBALAMIN ) 250 MCG tablet Take 500 mcg by mouth daily.    Vitamin D3 (VITAMIN D ) 25 MCG tablet Take 1,000 Units by mouth daily.   [DISCONTINUED] hydrALAZINE  (APRESOLINE ) 25 MG tablet Take 1 tablet (25 mg total) by mouth 3 (three) times daily. (Patient taking differently: Take 25 mg by mouth daily.)   hydrALAZINE  (APRESOLINE ) 25 MG tablet Take 1 tablet (25 mg total) by mouth daily.   No facility-administered encounter medications on file as of 03/19/2024.   Diagnostic Tests Reviewed/Disposition:     Latest Ref Rng & Units 03/05/2024     5:22 AM 03/04/2024    4:35 PM 03/04/2024    2:27 AM  CBC  WBC 4.0 - 10.5 K/uL 9.7  12.5  6.5   Hemoglobin 12.0 - 15.0 g/dL 88.9  88.2  87.0   Hematocrit 36.0 - 46.0 % 33.0  34.8  38.6   Platelets 150 - 400 K/uL 159  167  185     CMP     Component Value Date/Time   NA 139 03/06/2024 0316   NA 138 07/05/2021 0827   K 4.3 03/06/2024 0316   CL 104 03/06/2024 0316   CO2 21 (L) 03/06/2024 0316   GLUCOSE 98 03/06/2024 0316   BUN 35 (H) 03/06/2024 0316   BUN 30 (H) 07/05/2021 0827   CREATININE 2.06 (H) 03/06/2024 0316   CREATININE 1.99 (H) 11/27/2022 1419   CALCIUM  9.2 03/06/2024 0316   PROT 6.8 03/04/2024 0227   PROT 6.7 07/05/2021 0827   ALBUMIN 3.9 03/04/2024 0227   ALBUMIN 4.3 07/05/2021 0827   AST 22 03/04/2024 0227   ALT 9 03/04/2024 0227   ALKPHOS 53 03/04/2024 0227   BILITOT 1.0 03/04/2024 0227   BILITOT 0.6 07/05/2021 0827   EGFR 24 (L) 11/27/2022 1419   EGFR 24 (L) 07/05/2021 0827   GFRNONAA 23 (L) 03/06/2024 0316    Consults: none  Discharge Instructions:  Follow up with PCP in 1 week - Repeat BMP (Cr) Monitor BP and resume antihypertensives as tolerated  Disease/illness Education: Reviewed plan of care with patient and educated her.  Home Health/Community Services Discussions/Referrals: none  Establishment or re-establishment of referral orders for community resources: none  Discussion with other health care providers: Reviewed all recent notes  Assessment and Support of treatment regimen adherence: Reviewed plan of care with patient and educated her.  Appointments Coordinated with: Reviewed plan of care with patient and educated her.  Education for self-management, independent living, and ADLs: Reviewed plan of care with patient and educated her.   Relevant past medical, surgical, family and social history reviewed and updated as indicated. Interim medical history since our last visit reviewed. Allergies and medications reviewed and updated.  Review of  Systems  Constitutional:  Positive for fatigue (a little bit). Negative for activity change, appetite change, chills, diaphoresis and fever.  Respiratory:  Positive for shortness of breath (occasional with activity). Negative for cough, chest tightness and wheezing.   Cardiovascular:  Negative for chest pain, palpitations and leg swelling.  Gastrointestinal: Negative.   Genitourinary:  Positive for frequency (just at night per baseline). Negative for decreased urine volume, difficulty urinating, dysuria, flank pain, hematuria, pelvic pain and urgency.  Neurological:  Positive for dizziness and weakness. Negative for tremors, syncope, facial asymmetry, light-headedness, numbness and headaches.  Psychiatric/Behavioral: Negative.      Per HPI unless specifically indicated above     Objective:    BP  132/70 (BP Location: Left Arm, Patient Position: Sitting, Cuff Size: Normal)   Pulse 66   Temp 98.1 F (36.7 C) (Oral)   Resp 15   Ht 5' 9.02 (1.753 m)   Wt 159 lb 12.8 oz (72.5 kg)   SpO2 99%   BMI 23.59 kg/m   Wt Readings from Last 3 Encounters:  03/19/24 159 lb 12.8 oz (72.5 kg)  03/04/24 164 lb 3.9 oz (74.5 kg)  03/04/24 160 lb (72.6 kg)    Physical Exam Vitals and nursing note reviewed.  Constitutional:      General: She is awake. She is not in acute distress.    Appearance: She is well-developed and well-groomed. She is not ill-appearing or toxic-appearing.  HENT:     Head: Normocephalic.     Right Ear: Hearing and external ear normal.     Left Ear: Hearing and external ear normal.  Eyes:     General: Lids are normal.        Right eye: No discharge.        Left eye: No discharge.     Conjunctiva/sclera: Conjunctivae normal.     Pupils: Pupils are equal, round, and reactive to light.  Neck:     Thyroid : No thyromegaly.     Vascular: No carotid bruit.  Cardiovascular:     Rate and Rhythm: Normal rate and regular rhythm.     Heart sounds: Normal heart sounds. No murmur  heard.    No gallop.  Pulmonary:     Effort: Pulmonary effort is normal. No accessory muscle usage or respiratory distress.     Breath sounds: Normal breath sounds.  Abdominal:     General: Bowel sounds are normal. There is no distension.     Palpations: Abdomen is soft.     Tenderness: There is no abdominal tenderness.  Musculoskeletal:     Cervical back: Normal range of motion and neck supple.     Right lower leg: No edema.     Left lower leg: No edema.  Lymphadenopathy:     Cervical: No cervical adenopathy.  Skin:    General: Skin is warm and dry.  Neurological:     Mental Status: She is alert and oriented to person, place, and time.     Deep Tendon Reflexes: Reflexes are normal and symmetric.     Reflex Scores:      Brachioradialis reflexes are 2+ on the right side and 2+ on the left side.      Patellar reflexes are 2+ on the right side and 2+ on the left side. Psychiatric:        Attention and Perception: Attention normal.        Mood and Affect: Mood normal.        Speech: Speech normal.        Behavior: Behavior normal. Behavior is cooperative.        Thought Content: Thought content normal.     Results for orders placed or performed during the hospital encounter of 03/04/24  Basic metabolic panel   Collection Time: 03/04/24  4:35 PM  Result Value Ref Range   Sodium 137 135 - 145 mmol/L   Potassium 4.5 3.5 - 5.1 mmol/L   Chloride 105 98 - 111 mmol/L   CO2 27 22 - 32 mmol/L   Glucose, Bld 123 (H) 70 - 99 mg/dL   BUN 36 (H) 8 - 23 mg/dL   Creatinine, Ser 7.36 (H) 0.44 - 1.00 mg/dL   Calcium   9.1 8.9 - 10.3 mg/dL   GFR, Estimated 17 (L) >60 mL/min   Anion gap 5 5 - 15  CBC with Differential   Collection Time: 03/04/24  4:35 PM  Result Value Ref Range   WBC 12.5 (H) 4.0 - 10.5 K/uL   RBC 3.68 (L) 3.87 - 5.11 MIL/uL   Hemoglobin 11.7 (L) 12.0 - 15.0 g/dL   HCT 65.1 (L) 63.9 - 53.9 %   MCV 94.6 80.0 - 100.0 fL   MCH 31.8 26.0 - 34.0 pg   MCHC 33.6 30.0 - 36.0  g/dL   RDW 86.9 88.4 - 84.4 %   Platelets 167 150 - 400 K/uL   nRBC 0.0 0.0 - 0.2 %   Neutrophils Relative % 88 %   Neutro Abs 10.9 (H) 1.7 - 7.7 K/uL   Lymphocytes Relative 6 %   Lymphs Abs 0.8 0.7 - 4.0 K/uL   Monocytes Relative 6 %   Monocytes Absolute 0.8 0.1 - 1.0 K/uL   Eosinophils Relative 0 %   Eosinophils Absolute 0.0 0.0 - 0.5 K/uL   Basophils Relative 0 %   Basophils Absolute 0.0 0.0 - 0.1 K/uL   Immature Granulocytes 0 %   Abs Immature Granulocytes 0.04 0.00 - 0.07 K/uL  Lactic acid, plasma   Collection Time: 03/04/24  4:35 PM  Result Value Ref Range   Lactic Acid, Venous 0.8 0.5 - 1.9 mmol/L  Culture, blood (routine x 2)   Collection Time: 03/04/24  4:36 PM   Specimen: BLOOD  Result Value Ref Range   Specimen Description BLOOD LEFT ANTECUBITAL    Special Requests      BOTTLES DRAWN AEROBIC AND ANAEROBIC Blood Culture adequate volume   Culture      NO GROWTH 5 DAYS Performed at [facility], [address] [place], [place] [address]    Report Status 03/09/2024 FINAL   Culture, blood (routine x 2)   Collection Time: 03/04/24  4:36 PM   Specimen: BLOOD  Result Value Ref Range   Specimen Description BLOOD RIGHT ANTECUBITAL    Special Requests      BOTTLES DRAWN AEROBIC AND ANAEROBIC Blood Culture adequate volume   Culture      NO GROWTH 5 DAYS Performed at [facility], [address] [place], [place] [address]    Report Status 03/09/2024 FINAL   Basic metabolic panel   Collection Time: 03/05/24  5:22 AM  Result Value Ref Range   Sodium 141 135 - 145 mmol/L   Potassium 4.7 3.5 - 5.1 mmol/L   Chloride 109 98 - 111 mmol/L   CO2 27 22 - 32 mmol/L   Glucose, Bld 95 70 - 99 mg/dL   BUN 34 (H) 8 - 23 mg/dL   Creatinine, Ser 7.69 (H) 0.44 - 1.00 mg/dL   Calcium  9.1 8.9 - 10.3 mg/dL   GFR, Estimated 20 (L) >60 mL/min   Anion gap 5 5 - 15  CBC   Collection Time: 03/05/24  5:22 AM  Result Value Ref Range   WBC 9.7 4.0 - 10.5 K/uL   RBC  3.48 (L) 3.87 - 5.11 MIL/uL   Hemoglobin 11.0 (L) 12.0 - 15.0 g/dL   HCT 66.9 (L) 63.9 - 53.9 %   MCV 94.8 80.0 - 100.0 fL   MCH 31.6 26.0 - 34.0 pg   MCHC 33.3 30.0 - 36.0 g/dL   RDW 86.7 88.4 - 84.4 %   Platelets 159 150 - 400 K/uL   nRBC  0.0 0.0 - 0.2 %  Basic metabolic panel   Collection Time: 03/06/24  3:16 AM  Result Value Ref Range   Sodium 139 135 - 145 mmol/L   Potassium 4.3 3.5 - 5.1 mmol/L   Chloride 104 98 - 111 mmol/L   CO2 21 (L) 22 - 32 mmol/L   Glucose, Bld 98 70 - 99 mg/dL   BUN 35 (H) 8 - 23 mg/dL   Creatinine, Ser 7.93 (H) 0.44 - 1.00 mg/dL   Calcium  9.2 8.9 - 10.3 mg/dL   GFR, Estimated 23 (L) >60 mL/min   Anion gap 14 5 - 15      Assessment & Plan:   Problem List Items Addressed This Visit       Cardiovascular and Mediastinum   Essential hypertension   Chronic, stable.  BP at goal today in office. Recommend she stop Hydralazine  if any dizzy episodes at home. Recommend she get up slowly from sitting + wear compression hose on during day and off at night.  Recommend she monitor BP at least a few mornings a week at home and document.  DASH diet at home.  Continue current medication regimen and adjust as needed.  Labs today: CBC, BMP.         Relevant Medications   hydrALAZINE  (APRESOLINE ) 25 MG tablet     Endocrine   Secondary hyperparathyroidism of renal origin   Chronic, ongoing.  Followed by nephrology, continue current medication regimen as recommended by them.  Recent labs and notes reviewed.      Hypothyroidism   Chronic, ongoing.  Continue current medication regimen and adjust as needed.  Thyroid  labs today.      Relevant Orders   T4, free   TSH     Genitourinary   Chronic kidney disease, stage 4, severely decreased GFR (HCC) - Primary   Chronic, ongoing, followed by nephrology.  Continue this collaboration and medication regimen as recommended by them.  Recent labs and notes reviewed.  LABS: CBC, BMP.        Other   Hyperlipidemia    Chronic, ongoing.  Is taking Crestor , continue this and adjust at needed.  Lipid panel on labs today.      Relevant Medications   hydrALAZINE  (APRESOLINE ) 25 MG tablet   Other Relevant Orders   Lipid Panel w/o Chol/HDL Ratio   Bacteremia   Acute with symptoms improving.  Will recheck labs today and determine any next steps needed after these return.      Relevant Orders   CBC with Differential/Platelet   Basic metabolic panel with GFR   Urinalysis, Routine w reflex microscopic   Other Visit Diagnoses       Pyuria       Urine sent for culture.   Relevant Orders   Urine Culture        Follow up plan: Return for as scheduled in future.

## 2024-03-19 NOTE — Assessment & Plan Note (Signed)
 Chronic, stable.  BP at goal today in office. Recommend she stop Hydralazine  if any dizzy episodes at home. Recommend she get up slowly from sitting + wear compression hose on during day and off at night.  Recommend she monitor BP at least a few mornings a week at home and document.  DASH diet at home.  Continue current medication regimen and adjust as needed.  Labs today: CBC, BMP.

## 2024-03-19 NOTE — Assessment & Plan Note (Signed)
Chronic, ongoing.  Continue current medication regimen and adjust as needed.  Thyroid labs today. 

## 2024-03-19 NOTE — Assessment & Plan Note (Signed)
 Chronic, ongoing.  Is taking Crestor , continue this and adjust at needed.  Lipid panel on labs today.

## 2024-03-19 NOTE — Assessment & Plan Note (Signed)
Chronic, ongoing.  Followed by nephrology, continue current medication regimen as recommended by them.  Recent labs and notes reviewed.

## 2024-03-19 NOTE — Assessment & Plan Note (Signed)
 Acute with symptoms improving.  Will recheck labs today and determine any next steps needed after these return.

## 2024-03-19 NOTE — Assessment & Plan Note (Signed)
 Chronic, ongoing, followed by nephrology.  Continue this collaboration and medication regimen as recommended by them.  Recent labs and notes reviewed.  LABS: CBC, BMP.

## 2024-03-20 ENCOUNTER — Ambulatory Visit: Payer: Self-pay | Admitting: Nurse Practitioner

## 2024-03-20 DIAGNOSIS — R7881 Bacteremia: Secondary | ICD-10-CM | POA: Diagnosis not present

## 2024-03-20 LAB — CBC WITH DIFFERENTIAL/PLATELET
Basophils Absolute: 0.1 x10E3/uL (ref 0.0–0.2)
Basos: 1 %
EOS (ABSOLUTE): 0.1 x10E3/uL (ref 0.0–0.4)
Eos: 1 %
Hematocrit: 39.2 % (ref 34.0–46.6)
Hemoglobin: 12.3 g/dL (ref 11.1–15.9)
Immature Grans (Abs): 0 x10E3/uL (ref 0.0–0.1)
Immature Granulocytes: 0 %
Lymphocytes Absolute: 1.2 x10E3/uL (ref 0.7–3.1)
Lymphs: 19 %
MCH: 30.4 pg (ref 26.6–33.0)
MCHC: 31.4 g/dL — ABNORMAL LOW (ref 31.5–35.7)
MCV: 97 fL (ref 79–97)
Monocytes Absolute: 0.6 x10E3/uL (ref 0.1–0.9)
Monocytes: 9 %
Neutrophils Absolute: 4.3 x10E3/uL (ref 1.4–7.0)
Neutrophils: 70 %
Platelets: 305 x10E3/uL (ref 150–450)
RBC: 4.04 x10E6/uL (ref 3.77–5.28)
RDW: 12.4 % (ref 11.7–15.4)
WBC: 6.3 x10E3/uL (ref 3.4–10.8)

## 2024-03-20 LAB — BASIC METABOLIC PANEL WITH GFR
BUN/Creatinine Ratio: 11 — ABNORMAL LOW (ref 12–28)
BUN: 22 mg/dL (ref 8–27)
CO2: 20 mmol/L (ref 20–29)
Calcium: 10 mg/dL (ref 8.7–10.3)
Chloride: 99 mmol/L (ref 96–106)
Creatinine, Ser: 2.07 mg/dL — ABNORMAL HIGH (ref 0.57–1.00)
Glucose: 95 mg/dL (ref 70–99)
Potassium: 5.1 mmol/L (ref 3.5–5.2)
Sodium: 135 mmol/L (ref 134–144)
eGFR: 23 mL/min/1.73 — ABNORMAL LOW (ref >59)

## 2024-03-20 LAB — URINALYSIS, ROUTINE W REFLEX MICROSCOPIC
Bilirubin, UA: NEGATIVE
Glucose, UA: NEGATIVE
Nitrite, UA: NEGATIVE
RBC, UA: NEGATIVE
Specific Gravity, UA: 1.01 (ref 1.005–1.030)
Urobilinogen, Ur: 0.2 mg/dL (ref 0.2–1.0)
pH, UA: 6 (ref 5.0–7.5)

## 2024-03-20 LAB — MICROSCOPIC EXAMINATION: RBC, Urine: NONE SEEN /HPF (ref 0–2)

## 2024-03-20 LAB — TSH: TSH: 2.27 u[IU]/mL (ref 0.450–4.500)

## 2024-03-20 LAB — LIPID PANEL W/O CHOL/HDL RATIO
Cholesterol, Total: 145 mg/dL (ref 100–199)
HDL: 62 mg/dL (ref >39)
LDL Chol Calc (NIH): 64 mg/dL (ref 0–99)
Triglycerides: 104 mg/dL (ref 0–149)
VLDL Cholesterol Cal: 19 mg/dL (ref 5–40)

## 2024-03-20 LAB — T4, FREE: Free T4: 1.54 ng/dL (ref 0.82–1.77)

## 2024-03-20 NOTE — Progress Notes (Signed)
 Contacted via MyChart  Good afternoon Karla Little, I have sent you urine for culture since there is still some bacteria showing. Will extend treatment if needed based on culture results. Blood counts on CBC are improved this check and kidney function continues to show Stage 4 kidney disease.  Any questions? Keep being stellar!!  Thank you for allowing me to participate in your care.  I appreciate you. Kindest regards, Sabastien Tyler

## 2024-03-22 MED ORDER — CIPROFLOXACIN HCL 500 MG PO TABS: 500.0000 mg | ORAL_TABLET | Freq: Every day | ORAL | 0 refills | Status: DC

## 2024-03-24 LAB — URINE CULTURE

## 2024-03-25 ENCOUNTER — Emergency Department
Admission: EM | Admit: 2024-03-25 | Discharge: 2024-03-25 | Disposition: A | Discharge status: Home / Self Care | Source: Ambulatory Visit | Attending: Emergency Medicine | Admitting: Emergency Medicine

## 2024-03-25 ENCOUNTER — Other Ambulatory Visit: Payer: Self-pay

## 2024-03-25 ENCOUNTER — Encounter: Payer: Self-pay | Admitting: Intensive Care

## 2024-03-25 DIAGNOSIS — N3000 Acute cystitis without hematuria: Secondary | ICD-10-CM | POA: Insufficient documentation

## 2024-03-25 DIAGNOSIS — N189 Chronic kidney disease, unspecified: Secondary | ICD-10-CM | POA: Insufficient documentation

## 2024-03-25 DIAGNOSIS — E039 Hypothyroidism, unspecified: Secondary | ICD-10-CM | POA: Diagnosis not present

## 2024-03-25 DIAGNOSIS — R350 Frequency of micturition: Secondary | ICD-10-CM | POA: Diagnosis present

## 2024-03-25 DIAGNOSIS — B952 Enterococcus as the cause of diseases classified elsewhere: Secondary | ICD-10-CM | POA: Insufficient documentation

## 2024-03-25 DIAGNOSIS — A498 Other bacterial infections of unspecified site: Secondary | ICD-10-CM

## 2024-03-25 DIAGNOSIS — I129 Hypertensive chronic kidney disease with stage 1 through stage 4 chronic kidney disease, or unspecified chronic kidney disease: Secondary | ICD-10-CM | POA: Diagnosis not present

## 2024-03-25 LAB — COMPREHENSIVE METABOLIC PANEL WITH GFR
ALT: 11 U/L (ref 0–44)
AST: 23 U/L (ref 15–41)
Albumin: 3.9 g/dL (ref 3.5–5.0)
Alkaline Phosphatase: 55 U/L (ref 38–126)
Anion gap: 12 (ref 5–15)
BUN: 18 mg/dL (ref 8–23)
CO2: 23 mmol/L (ref 22–32)
Calcium: 9.6 mg/dL (ref 8.9–10.3)
Chloride: 102 mmol/L (ref 98–111)
Creatinine, Ser: 1.66 mg/dL — ABNORMAL HIGH (ref 0.44–1.00)
GFR, Estimated: 29 mL/min — ABNORMAL LOW (ref >60)
Glucose, Bld: 105 mg/dL — ABNORMAL HIGH (ref 70–99)
Potassium: 4.2 mmol/L (ref 3.5–5.1)
Sodium: 137 mmol/L (ref 135–145)
Total Bilirubin: 0.4 mg/dL (ref 0.0–1.2)
Total Protein: 7.4 g/dL (ref 6.5–8.1)

## 2024-03-25 LAB — URINALYSIS, ROUTINE W REFLEX MICROSCOPIC
Bacteria, UA: NONE SEEN
Bilirubin Urine: NEGATIVE
Glucose, UA: NEGATIVE mg/dL
Hgb urine dipstick: NEGATIVE
Ketones, ur: NEGATIVE mg/dL
Nitrite: NEGATIVE
Protein, ur: NEGATIVE mg/dL
RBC / HPF: 0 RBC/hpf (ref 0–5)
Specific Gravity, Urine: 1.01 (ref 1.005–1.030)
Squamous Epithelial / HPF: >50 /HPF (ref 0–5)
pH: 6 (ref 5.0–8.0)

## 2024-03-25 LAB — CBC WITH DIFFERENTIAL/PLATELET
Abs Immature Granulocytes: 0.02 K/uL (ref 0.00–0.07)
Basophils Absolute: 0.1 K/uL (ref 0.0–0.1)
Basophils Relative: 1 %
Eosinophils Absolute: 0.1 K/uL (ref 0.0–0.5)
Eosinophils Relative: 2 %
HCT: 37.3 % (ref 36.0–46.0)
Hemoglobin: 12.6 g/dL (ref 12.0–15.0)
Immature Granulocytes: 0 %
Lymphocytes Relative: 23 %
Lymphs Abs: 1.1 K/uL (ref 0.7–4.0)
MCH: 31.7 pg (ref 26.0–34.0)
MCHC: 33.8 g/dL (ref 30.0–36.0)
MCV: 93.7 fL (ref 80.0–100.0)
Monocytes Absolute: 0.4 K/uL (ref 0.1–1.0)
Monocytes Relative: 9 %
Neutro Abs: 3.3 K/uL (ref 1.7–7.7)
Neutrophils Relative %: 65 %
Platelets: 268 K/uL (ref 150–400)
RBC: 3.98 MIL/uL (ref 3.87–5.11)
RDW: 12.9 % (ref 11.5–15.5)
WBC: 5 K/uL (ref 4.0–10.5)
nRBC: 0 % (ref 0.0–0.2)

## 2024-03-25 MED ORDER — FOSFOMYCIN TROMETHAMINE 3 G PO PACK
3.0000 g | PACK | Freq: Once | ORAL | Status: AC
  Administered 2024-03-25: 3 g via ORAL
  Filled 2024-03-25: qty 3

## 2024-03-25 NOTE — ED Provider Notes (Signed)
 [facility] Provider Note    Event Date/Time   First MD Initiated Contact with Patient 03/25/24 [phone]     (approximate)   History   Chief Complaint Abnormal Lab   HPI  Karla Little is a 89 y.o. female with past medical history of hypertension, hyperlipidemia, CKD, and hypothyroidism who presents to the ED complaining of abnormal labs.  Patient reports that she had follow-up with her PCP recently following admission for E. coli bacteremia secondary to UTI.  She had repeat urine culture sent and was told this grew Enterococcus faecalis.  She was told she needed to come to the hospital for IV antibiotics to treat this.  She does endorse urinary frequency, but states she is otherwise feeling well with no fevers, dysuria, abdominal pain, flank pain, nausea, or vomiting.     Physical Exam   Triage Vital Signs: ED Triage Vitals  Encounter Vitals Group     BP 03/25/24 0838 134/69     Girls Systolic BP Percentile --      Girls Diastolic BP Percentile --      Boys Systolic BP Percentile --      Boys Diastolic BP Percentile --      Pulse Rate 03/25/24 0838 74     Resp 03/25/24 0838 17     Temp 03/25/24 0838 98.3 F (36.8 C)     Temp Source 03/25/24 0838 Oral     SpO2 03/25/24 0838 100 %     Weight 03/25/24 0838 160 lb (72.6 kg)     Height 03/25/24 0838 5' 9 (1.753 m)     Head Circumference --      Peak Flow --      Pain Score 03/25/24 0842 0     Pain Loc --      Pain Education --      Exclude from Growth Chart --     Most recent vital signs: Vitals:   03/25/24 0838  BP: 134/69  Pulse: 74  Resp: 17  Temp: 98.3 F (36.8 C)  SpO2: 100%    Constitutional: Alert and oriented. Eyes: Conjunctivae are normal. Head: Atraumatic. Nose: No congestion/rhinnorhea. Mouth/Throat: Mucous membranes are moist.  Cardiovascular: Normal rate, regular rhythm. Grossly normal heart sounds.  2+ radial pulses bilaterally. Respiratory: Normal respiratory effort.   No retractions. Lungs CTAB. Gastrointestinal: Soft and nontender.  No CVA tenderness bilaterally.  No distention. Musculoskeletal: No lower extremity tenderness nor edema.  Neurologic:  Normal speech and language. No gross focal neurologic deficits are appreciated.    ED Results / Procedures / Treatments   Labs (all labs ordered are listed, but only abnormal results are displayed) Labs Reviewed  COMPREHENSIVE METABOLIC PANEL WITH GFR - Abnormal; Notable for the following components:      Result Value   Glucose, Bld 105 (*)    Creatinine, Ser 1.66 (*)    GFR, Estimated 29 (*)    All other components within normal limits  URINE CULTURE  CBC WITH DIFFERENTIAL/PLATELET  URINALYSIS, ROUTINE W REFLEX MICROSCOPIC    PROCEDURES:  Critical Care performed: No  Procedures   MEDICATIONS ORDERED IN ED: Medications  fosfomycin (MONUROL) packet 3 g (has no administration in time range)     IMPRESSION / MDM / ASSESSMENT AND PLAN / ED COURSE  I reviewed the triage vital signs and the nursing notes.  89 y.o. female with past medical history of hypertension, hyperlipidemia, CKD, and hypothyroidism who presents to the ED complaining of outpatient urine culture growing Enterococcus faecalis.  Patient's presentation is most consistent with acute presentation with potential threat to life or bodily function.  Differential diagnosis includes, but is not limited to, sepsis, UTI, anemia, electrolyte abnormality, AKI.  Patient well-appearing and in no acute distress, vital signs are unremarkable and do not appear concerning for sepsis.  I did review her outside urine culture that was positive for Enterococcus faecalis, resistant to ciprofloxacin and Levaquin.  She reports allergy to penicillins and is not a candidate for Macrobid given CKD.  She was told to present to the ED due to potential need for IV antibiotics.  Despite this, patient is highly motivated to manage  this outside of the hospital, which is reasonable given no signs of sepsis and she is minimally symptomatic.  Labs are also reassuring with stable CKD and no significant anemia, leukocytosis, or electrolyte abnormality.  In further discussion with pharmacy, patient may be treated with one-time dose of fosfomycin.  We will send repeat urine culture and have her follow-up with her PCP for reassessment.  She was counseled to return to the ED for new or worsening symptoms, patient and family agree with plan.      FINAL CLINICAL IMPRESSION(S) / ED DIAGNOSES   Final diagnoses:  Enterococcus faecalis infection  Acute cystitis without hematuria     Rx / DC Orders   ED Discharge Orders     None        Note:  This document was prepared using Dragon voice recognition software and may include unintentional dictation errors.   [name], MD 03/25/24 [phone]

## 2024-03-25 NOTE — ED Triage Notes (Signed)
 Patient sent to ER by PCP for positive Enterococcus faecalis results.   Reports she was recently admitted to hospital for E-coli

## 2024-03-25 NOTE — ED Notes (Signed)
Pt given water to assist with obtaining urine sample.

## 2024-03-27 LAB — URINE CULTURE: Culture: 60000 — AB

## 2024-03-29 NOTE — Patient Instructions (Signed)
 Chronic Kidney Disease: Eating Plan Chronic kidney disease (CKD) is when your kidneys aren't working well. They can't remove waste, fluids, and other substances from your blood. When these substances build up, they can worsen kidney damage and affect your health. Eating certain foods can lead to a buildup of these substances. Changing your diet can help prevent more kidney damage. Diet changes may also delay dialysis or even keep you from needing it. What nutrients should I limit? Work with your health care team and an expert in healthy eating (dietitian) to make a meal plan that's right for you. Foods you can eat and foods you should limit or avoid will depend on: The stage of your kidney disease. Any other conditions you have. The items listed below are not a complete list. Talk with a dietitian to learn what's best for you. Potassium Potassium affects how well your heart beats. Too much potassium in your blood can cause an irregular heartbeat or even a heart attack. You may need to limit foods that are high in potassium, such as: Liquid milk and soy milk. Salt substitutes that contain potassium. Fruits like: Bananas. Apricots. Melon. Prunes and raisins. Kiwi. Nectarines and oranges. Vegetables, such as: Potatoes, sweet potatoes, and yams. Tomatoes. Leafy greens. Beets. Avocado. Pumpkin and winter squash. Beans, like lima beans. Nuts. Phosphorus Phosphorus is a mineral found in your bones. You need a balance between calcium and phosphorus to build and maintain healthy bones.  Too much added phosphorus from the foods you eat can pull calcium from your bones. Losing calcium can make your bones weak and more likely to break. Too much phosphorus can also make your skin itch. You may need to limit foods that are high in phosphorus or that have added phosphorus, such as: Liquid milk and dairy products. Dark-colored sodas or soft drinks. Bran cereals and oatmeal. Protein  Protein  helps your body make and keep muscle. Protein also helps to repair your body's cells and tissues.  One of the natural breakdown products of protein is a waste product called urea. When your kidneys aren't working well, they can't remove waste like urea. Reducing protein in your diet can help keep urea from building up in your blood. Depending on your stage of kidney disease, you may need to eat smaller portions of foods that are high in protein. Sources of animal protein include: Meat (all types). Fish and seafood. Poultry. Eggs. Dairy. Other protein foods include: Beans and legumes. Nuts and nut butter. Soy, like tofu.  Sodium Salt (sodium) helps to keep a healthy balance of fluids in your body. Too much salt can increase your blood pressure, which can harm your heart and lungs. Extra salt can also cause your body to keep too much fluid, making your kidneys work harder. You may need to limit or avoid foods that are high in salt, such as: Salt seasonings. Soy and teriyaki sauce. Meats that are: Packaged. Precooked. Cured. Processed. Salted crackers and snack foods. Fast food. Canned soups and foods. Pickled foods. Boxed mixes or ready-to-eat boxed meals and side dishes. Bottled dressings, sauces, and marinades. Talk with your dietitian about how much potassium, phosphorus, protein, and salt you may have each day. What are tips for following this plan? Reading food labels  Check the amount of salt in foods. Limit foods that have salt listed among the first five ingredients. Try to eat low-salt foods. Check the ingredient list for added phosphorus or potassium. "Phos" in an ingredient is a sign that  phosphorus has been added. Do not buy foods that are calcium-enriched or that have calcium added to them (fortified). Buy canned vegetables and beans that say "no salt added." Rinse them before eating. Lifestyle Limit the amount of protein you eat from animal sources each day. Focus on  protein from plant sources, like tofu and dried beans, peas, and lentils. Do not add salt to food when cooking or before eating. Do not eat star fruit. It can be toxic for people with kidney problems. Talk with your health care provider before taking any vitamin or mineral supplements. If told by your provider: Track how much liquid you have so you can avoid drinking too much. Try to eat foods that are made mostly from water, like gelatin, ice cream, soups, and juicy fruits and vegetables. If you have diabetes and chronic kidney disease: If you have diabetes and CKD, you need to keep your blood sugar (glucose) in the target range recommended by your provider. Follow your diabetes management plan. This may include: Checking your blood glucose regularly. Taking medicines by mouth, or taking insulin, or both. Exercising for at least 30 minutes on 5 or more days each week, or as told by your provider. Tracking how many servings of carbohydrates you eat at each meal. Not using orange juice to treat low blood sugars. Instead, use apple juice, cranberry juice, or clear soda. You may be given guidelines on what foods and nutrients you may eat, and how much you can have each day. This depends on your stage of kidney disease and whether you have high blood pressure. Follow the meal plan your dietitian gives you. Where to find more information [employer] of Diabetes and Digestive and Kidney Diseases: [employer]Sync.si National Kidney Foundation: kidney.org This information is not intended to replace advice given to you by your health care provider. Make sure you discuss any questions you have with your health care provider. Document Revised: 01/01/2023 Document Reviewed: 01/01/2023 Elsevier Patient Education  2024 [employer].

## 2024-04-02 ENCOUNTER — Ambulatory Visit: Payer: Self-pay | Admitting: Nurse Practitioner

## 2024-04-02 ENCOUNTER — Ambulatory Visit (INDEPENDENT_AMBULATORY_CARE_PROVIDER_SITE_OTHER): Admitting: Nurse Practitioner

## 2024-04-02 ENCOUNTER — Encounter: Payer: Self-pay | Admitting: Nurse Practitioner

## 2024-04-02 VITALS — BP 147/88 | HR 78 | Temp 98.4°F | Resp 14 | Ht 69.02 in | Wt 158.0 lb

## 2024-04-02 DIAGNOSIS — I10 Essential (primary) hypertension: Secondary | ICD-10-CM | POA: Diagnosis not present

## 2024-04-02 DIAGNOSIS — N3001 Acute cystitis with hematuria: Secondary | ICD-10-CM | POA: Diagnosis not present

## 2024-04-02 DIAGNOSIS — N3000 Acute cystitis without hematuria: Secondary | ICD-10-CM | POA: Insufficient documentation

## 2024-04-02 LAB — URINALYSIS, ROUTINE W REFLEX MICROSCOPIC
Bilirubin, UA: NEGATIVE
Glucose, UA: NEGATIVE
Ketones, UA: NEGATIVE
Nitrite, UA: NEGATIVE
RBC, UA: NEGATIVE
Specific Gravity, UA: 1.015 (ref 1.005–1.030)
Urobilinogen, Ur: 1 mg/dL (ref 0.2–1.0)
pH, UA: 6.5 (ref 5.0–7.5)

## 2024-04-02 LAB — MICROSCOPIC EXAMINATION: RBC, Urine: NONE SEEN /HPF (ref 0–2)

## 2024-04-02 NOTE — Progress Notes (Signed)
 BP (!) 147/88 (BP Location: Left Arm, Patient Position: Sitting, Cuff Size: Normal)   Pulse 78   Temp 98.4 F (36.9 C) (Oral)   Resp 14   Ht 5' 9.02 (1.753 m)   Wt 158 lb (71.7 kg)   SpO2 98%   BMI 23.32 kg/m    Subjective:    Patient ID: Karla Little, female    DOB: 1933-07-04, 89 y.o.   MRN: [id number]  HPI: Karla Little is a 89 y.o. female  Chief Complaint  Patient presents with   Hospitalization Follow-up    Feels ok since being home. Would like to review some of the results.    ER FOLLOW UP Seen in ER on 03/25/24 for ongoing UTI and concern for needing IV abx therapy based on culture results and her allergies. No IV treatment while there, given dose Fosfomycin. Feeling better, no urinary symptoms. Time since discharge: 8 days Hospital/facility: [facility] Diagnosis: UTI  Procedures/tests: urine culture, CMP, CBC Consultants: none New medications: Fosfamycin Discharge instructions:  follow-up with PCP Status: stable   URINARY SYMPTOMS Dysuria: no Urinary frequency: yes at night per baseline Urgency: no Small volume voids: no Symptom severity: no Urinary incontinence: occasional dribbling at baseline Foul odor: no Hematuria: no Abdominal pain: no Back pain: occasional at baseline if working in yard Suprapubic pain/pressure: no Flank pain: no Fever:  no Vomiting: no Status: better Previous urinary tract infection: yes Recurrent urinary tract infection: no Sexual activity: No sexually active History of sexually transmitted disease: no Treatments attempted: antibiotics and increasing fluids    Relevant past medical, surgical, family and social history reviewed and updated as indicated. Interim medical history since our last visit reviewed. Allergies and medications reviewed and updated.  Review of Systems  Constitutional:  Negative for activity change, appetite change, diaphoresis, fatigue and fever.  Respiratory:  Negative for cough, chest tightness  and shortness of breath.   Cardiovascular:  Negative for chest pain, palpitations and leg swelling.  Gastrointestinal: Negative.   Endocrine: Negative for cold intolerance, heat intolerance and polyuria.  Genitourinary:  Positive for frequency (baseline at night). Negative for decreased urine volume, difficulty urinating, dysuria, flank pain, hematuria, pelvic pain and urgency.  Neurological: Negative.   Psychiatric/Behavioral: Negative.      Per HPI unless specifically indicated above     Objective:    BP (!) 147/88 (BP Location: Left Arm, Patient Position: Sitting, Cuff Size: Normal)   Pulse 78   Temp 98.4 F (36.9 C) (Oral)   Resp 14   Ht 5' 9.02 (1.753 m)   Wt 158 lb (71.7 kg)   SpO2 98%   BMI 23.32 kg/m   Wt Readings from Last 3 Encounters:  04/02/24 158 lb (71.7 kg)  03/25/24 160 lb (72.6 kg)  03/19/24 159 lb 12.8 oz (72.5 kg)    Physical Exam Vitals and nursing note reviewed.  Constitutional:      General: She is awake. She is not in acute distress.    Appearance: She is well-developed and well-groomed. She is not ill-appearing or toxic-appearing.  HENT:     Head: Normocephalic.     Right Ear: Hearing and external ear normal.     Left Ear: Hearing and external ear normal.  Eyes:     General: Lids are normal.        Right eye: No discharge.        Left eye: No discharge.     Conjunctiva/sclera: Conjunctivae normal.     Pupils:  Pupils are equal, round, and reactive to light.  Neck:     Thyroid : No thyromegaly.     Vascular: No carotid bruit.  Cardiovascular:     Rate and Rhythm: Normal rate and regular rhythm.     Heart sounds: Normal heart sounds. No murmur heard.    No gallop.  Pulmonary:     Effort: Pulmonary effort is normal. No accessory muscle usage or respiratory distress.     Breath sounds: Normal breath sounds.  Abdominal:     General: Bowel sounds are normal. There is no distension.     Palpations: Abdomen is soft.     Tenderness: There is no  abdominal tenderness. There is no right CVA tenderness or left CVA tenderness.  Musculoskeletal:     Cervical back: Normal range of motion and neck supple.     Right lower leg: No edema.     Left lower leg: No edema.  Lymphadenopathy:     Cervical: No cervical adenopathy.  Skin:    General: Skin is warm and dry.  Neurological:     Mental Status: She is alert and oriented to person, place, and time.     Deep Tendon Reflexes: Reflexes are normal and symmetric.     Reflex Scores:      Brachioradialis reflexes are 2+ on the right side and 2+ on the left side.      Patellar reflexes are 2+ on the right side and 2+ on the left side. Psychiatric:        Attention and Perception: Attention normal.        Mood and Affect: Mood normal.        Speech: Speech normal.        Behavior: Behavior normal. Behavior is cooperative.        Thought Content: Thought content normal.     Results for orders placed or performed during the hospital encounter of 03/25/24  CBC with Differential   Collection Time: 03/25/24  8:44 AM  Result Value Ref Range   WBC 5.0 4.0 - 10.5 K/uL   RBC 3.98 3.87 - 5.11 MIL/uL   Hemoglobin 12.6 12.0 - 15.0 g/dL   HCT 62.6 63.9 - 53.9 %   MCV 93.7 80.0 - 100.0 fL   MCH 31.7 26.0 - 34.0 pg   MCHC 33.8 30.0 - 36.0 g/dL   RDW 87.0 88.4 - 84.4 %   Platelets 268 150 - 400 K/uL   nRBC 0.0 0.0 - 0.2 %   Neutrophils Relative % 65 %   Neutro Abs 3.3 1.7 - 7.7 K/uL   Lymphocytes Relative 23 %   Lymphs Abs 1.1 0.7 - 4.0 K/uL   Monocytes Relative 9 %   Monocytes Absolute 0.4 0.1 - 1.0 K/uL   Eosinophils Relative 2 %   Eosinophils Absolute 0.1 0.0 - 0.5 K/uL   Basophils Relative 1 %   Basophils Absolute 0.1 0.0 - 0.1 K/uL   Immature Granulocytes 0 %   Abs Immature Granulocytes 0.02 0.00 - 0.07 K/uL  Comprehensive metabolic panel   Collection Time: 03/25/24  8:44 AM  Result Value Ref Range   Sodium 137 135 - 145 mmol/L   Potassium 4.2 3.5 - 5.1 mmol/L   Chloride 102 98 -  111 mmol/L   CO2 23 22 - 32 mmol/L   Glucose, Bld 105 (H) 70 - 99 mg/dL   BUN 18 8 - 23 mg/dL   Creatinine, Ser 8.33 (H) 0.44 - 1.00 mg/dL  Calcium  9.6 8.9 - 10.3 mg/dL   Total Protein 7.4 6.5 - 8.1 g/dL   Albumin 3.9 3.5 - 5.0 g/dL   AST 23 15 - 41 U/L   ALT 11 0 - 44 U/L   Alkaline Phosphatase 55 38 - 126 U/L   Total Bilirubin 0.4 0.0 - 1.2 mg/dL   GFR, Estimated 29 (L) >60 mL/min   Anion gap 12 5 - 15  Urine Culture   Collection Time: 03/25/24 10:22 AM   Specimen: Urine, Clean Catch  Result Value Ref Range   Specimen Description      URINE, CLEAN CATCH Performed at [facility], [address], [place], [place] [address]    Special Requests      NONE Performed at [facility], [address], [place], [place] [address]    Culture 60,000 COLONIES/mL ENTEROCOCCUS FAECALIS (A)    Report Status 03/27/2024 FINAL    Organism ID, Bacteria ENTEROCOCCUS FAECALIS (A)       Susceptibility   Enterococcus faecalis - MIC*    AMPICILLIN <=2 SENSITIVE Sensitive     NITROFURANTOIN <=16 SENSITIVE Sensitive     VANCOMYCIN 1 SENSITIVE Sensitive     * 60,000 COLONIES/mL ENTEROCOCCUS FAECALIS  Urinalysis, Routine w reflex microscopic -Urine, Clean Catch   Collection Time: 03/25/24 10:22 AM  Result Value Ref Range   Color, Urine YELLOW (A) YELLOW   APPearance CLOUDY (A) CLEAR   Specific Gravity, Urine 1.010 1.005 - 1.030   pH 6.0 5.0 - 8.0   Glucose, UA NEGATIVE NEGATIVE mg/dL   Hgb urine dipstick NEGATIVE NEGATIVE   Bilirubin Urine NEGATIVE NEGATIVE   Ketones, ur NEGATIVE NEGATIVE mg/dL   Protein, ur NEGATIVE NEGATIVE mg/dL   Nitrite NEGATIVE NEGATIVE   Leukocytes,Ua TRACE (A) NEGATIVE   RBC / HPF 0 0 - 5 RBC/hpf   WBC, UA 0-5 0 - 5 WBC/hpf   Bacteria, UA NONE SEEN NONE SEEN   Squamous Epithelial / HPF >50 0 - 5 /HPF   Non Squamous Epithelial PRESENT (A) NONE SEEN      Assessment & Plan:   Problem List Items Addressed This Visit       Cardiovascular and  Mediastinum   Essential hypertension   Chronic, stable.  BP above goal today in office, but reports was rushed. Recommend she stop Hydralazine  if any dizzy episodes at home. Recommend she get up slowly from sitting + wear compression hose on during day and off at night.  Recommend she monitor BP at least a few mornings a week at home and document.  DASH diet at home.  Continue current medication regimen and adjust as needed.  Labs today: up to date.           Genitourinary   Acute cystitis - Primary   Acute, has completed treatment. Will recheck urine today. She is asymptomatic at present, but with age concern for worsening if UTI remains present.  Discussed with her.  Treat further as needed.  May need urology if recurrence.      Relevant Orders   Urinalysis, Routine w reflex microscopic   Urine Culture     Follow up plan: Return if symptoms worsen or fail to improve.

## 2024-04-02 NOTE — Progress Notes (Signed)
 Contacted via MyChart  Urine is still showing some bacteria, we will see what culture shows. Any questions?

## 2024-04-02 NOTE — Assessment & Plan Note (Signed)
 Chronic, stable.  BP above goal today in office, but reports was rushed. Recommend she stop Hydralazine  if any dizzy episodes at home. Recommend she get up slowly from sitting + wear compression hose on during day and off at night.  Recommend she monitor BP at least a few mornings a week at home and document.  DASH diet at home.  Continue current medication regimen and adjust as needed.  Labs today: up to date.

## 2024-04-02 NOTE — Assessment & Plan Note (Signed)
 Acute, has completed treatment. Will recheck urine today. She is asymptomatic at present, but with age concern for worsening if UTI remains present.  Discussed with her.  Treat further as needed.  May need urology if recurrence.

## 2024-04-04 LAB — URINE CULTURE

## 2024-04-04 NOTE — Progress Notes (Signed)
 Contacted via MyChart  Good morning Karla Little, your urine culture returned and shows no infection.  Great news!!

## 2024-04-12 ENCOUNTER — Other Ambulatory Visit: Payer: Self-pay | Admitting: Nurse Practitioner

## 2024-04-12 DIAGNOSIS — E039 Hypothyroidism, unspecified: Secondary | ICD-10-CM

## 2024-04-13 NOTE — Telephone Encounter (Signed)
 Requested Prescriptions  Pending Prescriptions Disp Refills   levothyroxine  (SYNTHROID ) 50 MCG tablet [Pharmacy Med Name: LEVOTHYROXINE  50 MCG TABLET] 90 tablet 3    Sig: TAKE 1 TABLET BY MOUTH EVERY DAY     Endocrinology:  Hypothyroid Agents Passed - 04/13/2024  5:41 PM      Passed - TSH in normal range and within 360 days    TSH  Date Value Ref Range Status  03/19/2024 2.270 0.450 - 4.500 uIU/mL Final         Passed - Valid encounter within last 12 months    Recent Outpatient Visits           1 week ago Acute cystitis with hematuria   [place] [facility] [place], [place] T, NP   3 weeks ago Chronic kidney disease, stage 4, severely decreased GFR (HCC)   [place] [facility] [place], [place] T, NP   2 months ago Cellulitis of right ankle   [place] [facility] [place], [place] T, NP   2 months ago Chronic kidney disease, stage 4, severely decreased GFR (HCC)   [place] [facility] [place], Melanie T, NP   8 months ago Aortic atherosclerosis   [place] [facility] [place], Melanie DASEN, NP

## 2024-04-14 ENCOUNTER — Other Ambulatory Visit: Payer: Self-pay | Admitting: Nurse Practitioner

## 2024-04-16 NOTE — Telephone Encounter (Signed)
 Requested Prescriptions  Pending Prescriptions Disp Refills   famotidine  (PEPCID ) 20 MG tablet [Pharmacy Med Name: FAMOTIDINE  20 MG TABLET] 90 tablet 3    Sig: TAKE 1 TABLET BY MOUTH EVERYDAY AT BEDTIME     Gastroenterology:  H2 Antagonists Passed - 04/16/2024  8:57 AM      Passed - Valid encounter within last 12 months    Recent Outpatient Visits           2 weeks ago Acute cystitis with hematuria   [place] [facility] [place], [place] T, NP   4 weeks ago Chronic kidney disease, stage 4, severely decreased GFR (HCC)   [place] [facility] [place], [place] T, NP   2 months ago Cellulitis of right ankle   [place] [facility] [place], [place] T, NP   2 months ago Chronic kidney disease, stage 4, severely decreased GFR (HCC)   [facility] [place], Melanie T, NP   8 months ago Aortic atherosclerosis   [place] [facility] [place], Melanie DASEN, NP

## 2024-05-05 DIAGNOSIS — N2581 Secondary hyperparathyroidism of renal origin: Secondary | ICD-10-CM | POA: Diagnosis not present

## 2024-05-05 DIAGNOSIS — I10 Essential (primary) hypertension: Secondary | ICD-10-CM | POA: Diagnosis not present

## 2024-05-05 DIAGNOSIS — N184 Chronic kidney disease, stage 4 (severe): Secondary | ICD-10-CM | POA: Diagnosis not present

## 2024-05-05 DIAGNOSIS — E8722 Chronic metabolic acidosis: Secondary | ICD-10-CM | POA: Diagnosis not present

## 2025-02-12 ENCOUNTER — Encounter: Admitting: Nurse Practitioner
